# Patient Record
Sex: Male | Born: 1980 | Race: White | Hispanic: No | Smoking: Former smoker
Health system: Southern US, Community
[De-identification: ages and names within clinical notes are randomized; demographics above are authoritative.]

## PROBLEM LIST (undated history)

## (undated) DIAGNOSIS — M6530 Trigger finger, unspecified finger: Secondary | ICD-10-CM

## (undated) DIAGNOSIS — K047 Periapical abscess without sinus: Secondary | ICD-10-CM

## (undated) DIAGNOSIS — A0472 Enterocolitis due to Clostridium difficile, not specified as recurrent: Secondary | ICD-10-CM

## (undated) DIAGNOSIS — F419 Anxiety disorder, unspecified: Secondary | ICD-10-CM

## (undated) DIAGNOSIS — I10 Essential (primary) hypertension: Secondary | ICD-10-CM

## (undated) DIAGNOSIS — F329 Major depressive disorder, single episode, unspecified: Secondary | ICD-10-CM

## (undated) DIAGNOSIS — F32A Depression, unspecified: Secondary | ICD-10-CM

## (undated) HISTORY — PX: BILATERAL CARPAL TUNNEL RELEASE: SHX6508

## (undated) HISTORY — DX: Depression, unspecified: F32.A

## (undated) HISTORY — PX: SHOULDER SURGERY: SHX246

---
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
## 2010-10-03 ENCOUNTER — Emergency Department (HOSPITAL_COMMUNITY)
Admission: EM | Admit: 2010-10-03 | Discharge: 2010-10-03 | Payer: Self-pay | Source: Home / Self Care | Admitting: Emergency Medicine

## 2010-12-16 LAB — BASIC METABOLIC PANEL
BUN: 8 mg/dL (ref 6–23)
CO2: 25 mEq/L (ref 19–32)
Calcium: 9.1 mg/dL (ref 8.4–10.5)
Chloride: 102 mEq/L (ref 96–112)
Creatinine, Ser: 1.18 mg/dL (ref 0.4–1.5)
GFR calc Af Amer: >60 mL/min (ref >60)
GFR calc non Af Amer: >60 mL/min (ref >60)
Glucose, Bld: 88 mg/dL (ref 70–99)
Potassium: 3.4 mEq/L — ABNORMAL LOW (ref 3.5–5.1)
Sodium: 138 mEq/L (ref 135–145)

## 2010-12-16 LAB — HEPATIC FUNCTION PANEL
ALT: 19 U/L (ref 0–53)
AST: 22 U/L (ref 0–37)
Albumin: 3.9 g/dL (ref 3.5–5.2)
Alkaline Phosphatase: 67 U/L (ref 39–117)
Bilirubin, Direct: 0.1 mg/dL (ref 0.0–0.3)
Indirect Bilirubin: 0.4 mg/dL (ref 0.3–0.9)
Total Bilirubin: 0.5 mg/dL (ref 0.3–1.2)
Total Protein: 7.3 g/dL (ref 6.0–8.3)

## 2010-12-16 LAB — DIFFERENTIAL
Basophils Absolute: 0 10*3/uL (ref 0.0–0.1)
Basophils Relative: 0 % (ref 0–1)
Eosinophils Absolute: 0.2 10*3/uL (ref 0.0–0.7)
Eosinophils Relative: 2 % (ref 0–5)
Lymphocytes Relative: 22 % (ref 12–46)
Lymphs Abs: 1.9 10*3/uL (ref 0.7–4.0)
Monocytes Absolute: 1.1 10*3/uL — ABNORMAL HIGH (ref 0.1–1.0)
Monocytes Relative: 14 % — ABNORMAL HIGH (ref 3–12)
Neutro Abs: 5.3 10*3/uL (ref 1.7–7.7)
Neutrophils Relative %: 63 % (ref 43–77)

## 2010-12-16 LAB — CBC
HCT: 45 % (ref 39.0–52.0)
Hemoglobin: 16.1 g/dL (ref 13.0–17.0)
MCH: 31.5 pg (ref 26.0–34.0)
MCHC: 35.8 g/dL (ref 30.0–36.0)
MCV: 88.1 fL (ref 78.0–100.0)
Platelets: 197 10*3/uL (ref 150–400)
RBC: 5.11 MIL/uL (ref 4.22–5.81)
RDW: 12.5 % (ref 11.5–15.5)
WBC: 8.5 10*3/uL (ref 4.0–10.5)

## 2010-12-16 LAB — APTT: aPTT: 30 seconds (ref 24–37)

## 2010-12-16 LAB — PROTIME-INR
INR: 0.99 (ref 0.00–1.49)
Prothrombin Time: 13.3 seconds (ref 11.6–15.2)

## 2010-12-16 LAB — HEMOCCULT GUIAC POC 1CARD (OFFICE): Fecal Occult Bld: NEGATIVE

## 2014-10-06 HISTORY — PX: TRIGGER FINGER RELEASE: SHX641

## 2015-04-25 ENCOUNTER — Encounter (HOSPITAL_BASED_OUTPATIENT_CLINIC_OR_DEPARTMENT_OTHER): Payer: Self-pay | Admitting: *Deleted

## 2015-04-25 ENCOUNTER — Emergency Department (HOSPITAL_BASED_OUTPATIENT_CLINIC_OR_DEPARTMENT_OTHER)
Admission: EM | Admit: 2015-04-25 | Discharge: 2015-04-26 | Disposition: A | Discharge status: Home / Self Care | Payer: Medicaid - Out of State | Attending: Emergency Medicine | Admitting: Emergency Medicine

## 2015-04-25 DIAGNOSIS — R197 Diarrhea, unspecified: Secondary | ICD-10-CM | POA: Insufficient documentation

## 2015-04-25 DIAGNOSIS — R509 Fever, unspecified: Secondary | ICD-10-CM | POA: Insufficient documentation

## 2015-04-25 DIAGNOSIS — Z720 Tobacco use: Secondary | ICD-10-CM | POA: Diagnosis not present

## 2015-04-25 DIAGNOSIS — M2550 Pain in unspecified joint: Secondary | ICD-10-CM | POA: Insufficient documentation

## 2015-04-25 DIAGNOSIS — R1110 Vomiting, unspecified: Secondary | ICD-10-CM | POA: Diagnosis present

## 2015-04-25 DIAGNOSIS — R55 Syncope and collapse: Secondary | ICD-10-CM | POA: Diagnosis not present

## 2015-04-25 DIAGNOSIS — R42 Dizziness and giddiness: Secondary | ICD-10-CM | POA: Diagnosis not present

## 2015-04-25 DIAGNOSIS — I10 Essential (primary) hypertension: Secondary | ICD-10-CM | POA: Insufficient documentation

## 2015-04-25 DIAGNOSIS — R51 Headache: Secondary | ICD-10-CM | POA: Insufficient documentation

## 2015-04-25 HISTORY — DX: Trigger finger, unspecified finger: M65.30

## 2015-04-25 HISTORY — DX: Essential (primary) hypertension: I10

## 2015-04-25 LAB — COMPREHENSIVE METABOLIC PANEL
ALT: 33 U/L (ref 17–63)
AST: 46 U/L — ABNORMAL HIGH (ref 15–41)
Albumin: 4.6 g/dL (ref 3.5–5.0)
Alkaline Phosphatase: 62 U/L (ref 38–126)
Anion gap: 10 (ref 5–15)
BUN: 10 mg/dL (ref 6–20)
CO2: 25 mmol/L (ref 22–32)
Calcium: 9.2 mg/dL (ref 8.9–10.3)
Chloride: 102 mmol/L (ref 101–111)
Creatinine, Ser: 1.14 mg/dL (ref 0.61–1.24)
GFR calc Af Amer: >60 mL/min (ref >60)
GFR calc non Af Amer: >60 mL/min (ref >60)
Glucose, Bld: 113 mg/dL — ABNORMAL HIGH (ref 65–99)
Potassium: 3.6 mmol/L (ref 3.5–5.1)
Sodium: 137 mmol/L (ref 135–145)
Total Bilirubin: 0.6 mg/dL (ref 0.3–1.2)
Total Protein: 7.6 g/dL (ref 6.5–8.1)

## 2015-04-25 LAB — CBC
HCT: 47.3 % (ref 39.0–52.0)
Hemoglobin: 15.8 g/dL (ref 13.0–17.0)
MCH: 31.3 pg (ref 26.0–34.0)
MCHC: 33.4 g/dL (ref 30.0–36.0)
MCV: 93.8 fL (ref 78.0–100.0)
Platelets: 209 10*3/uL (ref 150–400)
RBC: 5.04 MIL/uL (ref 4.22–5.81)
RDW: 12.8 % (ref 11.5–15.5)
WBC: 7.2 10*3/uL (ref 4.0–10.5)

## 2015-04-25 MED ORDER — ACETAMINOPHEN 325 MG PO TABS
650.0000 mg | ORAL_TABLET | Freq: Once | ORAL | Status: AC | PRN
  Administered 2015-04-25: 650 mg via ORAL
  Filled 2015-04-25: qty 2

## 2015-04-25 MED ORDER — SODIUM CHLORIDE 0.9 % IV BOLUS (SEPSIS)
2000.0000 mL | Freq: Once | INTRAVENOUS | Status: AC
  Administered 2015-04-25: 2000 mL via INTRAVENOUS

## 2015-04-25 MED ORDER — ONDANSETRON HCL 4 MG/2ML IJ SOLN
4.0000 mg | Freq: Once | INTRAMUSCULAR | Status: AC
  Administered 2015-04-25: 4 mg via INTRAVENOUS

## 2015-04-25 MED ORDER — ONDANSETRON HCL 4 MG/2ML IJ SOLN
INTRAMUSCULAR | Status: AC
  Filled 2015-04-25: qty 2

## 2015-04-25 MED ORDER — MORPHINE SULFATE 4 MG/ML IJ SOLN
4.0000 mg | Freq: Once | INTRAMUSCULAR | Status: AC
  Administered 2015-04-25: 4 mg via INTRAVENOUS
  Filled 2015-04-25: qty 1

## 2015-04-25 NOTE — ED Notes (Signed)
Pt reported to Joss, RN that he vomited up the Tylenol he received at triage.

## 2015-04-25 NOTE — ED Notes (Signed)
Patient states that he is nauseated to multiple people. The MD aware and the patient verbal order given due to MD in another room. Orders carried out.

## 2015-04-25 NOTE — ED Notes (Signed)
Pt reports he was working outside yesterday, got dizzy and "passed out"- today reports he has vomited over 10 times and only voided once- febrile at home, 101

## 2015-04-25 NOTE — ED Provider Notes (Signed)
CSN: [id number]     Arrival date & time 04/25/15  2031 History  This chart was scribed for Orlie Dakin, MD by Meriel Pica, ED Scribe. This patient was seen in room MH03/MH03 and the patient's care was started 10:47 PM.   Chief Complaint  Patient presents with  . Emesis   Patient is a 34 y.o. male presenting with vomiting. The history is provided by the patient. No language interpreter was used.  Emesis Associated symptoms: arthralgias, diarrhea and headaches   Associated symptoms: no abdominal pain     HPI Comments: Edyn L Evenson is a 34 y.o. male, with PMhx of HTN and PShx of shoulder surgery, who presents to the Emergency Department complaining of sudden onset, constant, moderate dizziness meaning lightheadedness and diffuse arthralgias that began 1 day ago. Pt reports yesterday he was working outside when he lost consciousness for about 1-2 minutes. He was working an Office manager, Biomedical scientist He reports upon gaining his consciousness he drank some water and went back to work. After coming home from work yesterday he reports onset of light-headed dizziness and a frontal headache with associated arthralgias, vomiting, decreased PO intake, several episodes of diarrhea that has resolved, fever (Tmax of 102F). He additionally notes his last urination was at 4pm. The pt has taken ibuprofen at home with no relief. Pt was given a tylenol on arrival to the ED which he states came back up with an episode of emesis. Pt is a current tobacco smoker. Denies abdominal pain, dysuria, use of alcohol, or illegal drugs.   Past Medical History  Diagnosis Date  . Hypertension   . Trigger finger    Past Surgical History  Procedure Laterality Date  . Shoulder surgery    . Bilateral carpal tunnel release     No family history on file. History  Substance Use Topics  . Smoking status: Current Every Day Smoker    Types: Cigarettes  . Smokeless tobacco: Never Used  . Alcohol Use: No     Review of Systems  Constitutional: Positive for fever.  Respiratory: Negative.   Cardiovascular: Negative.   Gastrointestinal: Positive for vomiting and diarrhea. Negative for abdominal pain.  Genitourinary: Negative for dysuria.  Musculoskeletal: Positive for arthralgias.  Skin: Negative.   Allergic/Immunologic: Negative.   Neurological: Positive for dizziness, syncope, light-headedness and headaches.  Psychiatric/Behavioral: Negative.   All other systems reviewed and are negative.   Allergies  Review of patient's allergies indicates no known allergies.  Home Medications   Prior to Admission medications   Not on File   BP 134/76 mmHg  Pulse 92  Temp(Src) 99.6 F (37.6 C) (Oral)  Resp 20  Ht 5\' 8"  (1.727 m)  Wt 160 lb (72.576 kg)  BMI 24.33 kg/m2  SpO2 96% Physical Exam  Constitutional: He is oriented to person, place, and time. He appears well-developed and well-nourished. No distress.  Not ill-appearing  HENT:  Head: Normocephalic and atraumatic.  Eyes: Conjunctivae are normal. Pupils are equal, round, and reactive to light. Right eye exhibits no discharge. Left eye exhibits no discharge. No scleral icterus.  Neck: Neck supple. No JVD present. No tracheal deviation present. No thyromegaly present.  Cardiovascular: Normal rate and regular rhythm.   No murmur heard. Pulmonary/Chest: Effort normal and breath sounds normal. No respiratory distress.  Abdominal: Soft. Bowel sounds are normal. He exhibits no distension. There is no tenderness.  Musculoskeletal: Normal range of motion. He exhibits no edema or tenderness.  Neurological: He is alert and oriented  to person, place, and time. No cranial nerve deficit. Coordination normal.  Skin: Skin is warm and dry. No rash noted. No erythema. No pallor.  Psychiatric: He has a normal mood and affect. His behavior is normal.  Nursing note and vitals reviewed.   ED Course  Procedures  DIAGNOSTIC STUDIES: Oxygen  Saturation is 96% on RA, normal by my interpretation.    COORDINATION OF CARE: 10:55 PM Discussed treatment plan which includes to order diagnostic labs and IV fluids with pt. Pt acknowledges and agrees to plan.   Labs Review Labs Reviewed  COMPREHENSIVE METABOLIC PANEL - Abnormal; Notable for the following:    Glucose, Bld 113 (*)    AST 46 (*)    All other components within normal limits  CBC  URINALYSIS, ROUTINE W REFLEX MICROSCOPIC (NOT AT [facility])    Imaging Review No results found.   EKG Interpretation None     12:25 AM patient feels much improved after treatment with intravenous fluids, opiates and antiemetics. He is able to ambulate without difficulty. Not lightheaded on standing. He drinks without nausea or vomiting. Results for orders placed or performed during the hospital encounter of 04/25/15  Urinalysis, Routine w reflex microscopic (not at [facility])  Result Value Ref Range   Color, Urine YELLOW YELLOW   APPearance CLOUDY (A) CLEAR   Specific Gravity, Urine 1.022 1.005 - 1.030   pH 7.5 5.0 - 8.0   Glucose, UA NEGATIVE NEGATIVE mg/dL   Hgb urine dipstick NEGATIVE NEGATIVE   Bilirubin Urine NEGATIVE NEGATIVE   Ketones, ur 15 (A) NEGATIVE mg/dL   Protein, ur NEGATIVE NEGATIVE mg/dL   Urobilinogen, UA 0.2 0.0 - 1.0 mg/dL   Nitrite NEGATIVE NEGATIVE   Leukocytes, UA NEGATIVE NEGATIVE  Comprehensive metabolic panel  Result Value Ref Range   Sodium 137 135 - 145 mmol/L   Potassium 3.6 3.5 - 5.1 mmol/L   Chloride 102 101 - 111 mmol/L   CO2 25 22 - 32 mmol/L   Glucose, Bld 113 (H) 65 - 99 mg/dL   BUN 10 6 - 20 mg/dL   Creatinine, Ser 1.14 0.61 - 1.24 mg/dL   Calcium 9.2 8.9 - 10.3 mg/dL   Total Protein 7.6 6.5 - 8.1 g/dL   Albumin 4.6 3.5 - 5.0 g/dL   AST 46 (H) 15 - 41 U/L   ALT 33 17 - 63 U/L   Alkaline Phosphatase 62 38 - 126 U/L   Total Bilirubin 0.6 0.3 - 1.2 mg/dL   GFR calc non Af Amer >60 >60 mL/min   GFR calc Af Amer >60 >60 mL/min   Anion gap 10 5 - 15   CBC  Result Value Ref Range   WBC 7.2 4.0 - 10.5 K/uL   RBC 5.04 4.22 - 5.81 MIL/uL   Hemoglobin 15.8 13.0 - 17.0 g/dL   HCT 47.3 39.0 - 52.0 %   MCV 93.8 78.0 - 100.0 fL   MCH 31.3 26.0 - 34.0 pg   MCHC 33.4 30.0 - 36.0 g/dL   RDW 12.8 11.5 - 15.5 %   Platelets 209 150 - 400 K/uL  I-Stat CG4 Lactic Acid, ED  Result Value Ref Range   Lactic Acid, Venous 0.43 (L) 0.5 - 2.0 mmol/L   No results found.  MDM  No signs of meningitis. Symptoms most likely viral in etiology. Patient appears well and not septic appearing.Referral [facility] Plan Tylenol for aches or for fever. Prescription Zofran. Diagnosis febrile illness Final diagnoses:  None          Orlie Dakin, MD 04/26/15 [phone]

## 2015-04-26 LAB — URINALYSIS, ROUTINE W REFLEX MICROSCOPIC
Bilirubin Urine: NEGATIVE
Glucose, UA: NEGATIVE mg/dL
Hgb urine dipstick: NEGATIVE
Ketones, ur: 15 mg/dL — AB
Leukocytes, UA: NEGATIVE
Nitrite: NEGATIVE
Protein, ur: NEGATIVE mg/dL
Specific Gravity, Urine: 1.022 (ref 1.005–1.030)
Urobilinogen, UA: 0.2 mg/dL (ref 0.0–1.0)
pH: 7.5 (ref 5.0–8.0)

## 2015-04-26 LAB — I-STAT CG4 LACTIC ACID, ED: Lactic Acid, Venous: 0.43 mmol/L — ABNORMAL LOW (ref 0.5–2.0)

## 2015-04-26 MED ORDER — ONDANSETRON HCL 8 MG PO TABS: 8.0000 mg | ORAL_TABLET | Freq: Three times a day (TID) | ORAL | Status: DC | PRN

## 2015-04-26 NOTE — Discharge Instructions (Signed)
Fever, Adult Take Tylenol every 4 hours for temperature higher than 100.4 while awake. Make sure that you drink at least six 8 ounce glasses of water or Gatorade each day. We feel that you were dehydrated today. Take the medication prescribed as needed for nausea. Call the [facility] tomorrow to get a primary care physician and to be seen if not feeling better by next week. Return if you continue to vomit after taking the medication prescribed, or if you feel worse for any reason A fever is a temperature of 100.4 F (38 C) or above.  HOME CARE  Take fever medicine as told by your doctor. Do not  take aspirin for fever if you are younger than 32 years of age.  If you are given antibiotic medicine, take it as told. Finish the medicine even if you start to feel better.  Rest.  Drink enough fluids to keep your pee (urine) clear or pale yellow. Do not drink alcohol.  Take a bath or shower with room temperature water. Do not use ice water or alcohol sponge baths.  Wear lightweight, loose clothes. GET HELP RIGHT AWAY IF:   You are short of breath or have trouble breathing.  You are very weak.  You are dizzy or you pass out (faint).  You are very thirsty or are making little or no urine.  You have new pain.  You throw up (vomit) or have watery poop (diarrhea).  You keep throwing up or having watery poop for more than 1 to 2 days.  You have a stiff neck or light bothers your eyes.  You have a skin rash.  You have a fever or problems (symptoms) that last for more than 2 to 3 days.  You have a fever and your problems quickly get worse.  You keep throwing up the fluids you drink.  You do not feel better after 3 days.  You have new problems. MAKE SURE YOU:   Understand these instructions.  Will watch your condition.  Will get help right away if you are not doing well or get worse. Document Released: 07/01/2008 Document Revised: 12/15/2011 Document  Reviewed: 07/24/2011 [facility] Patient Information 2015 [place], [place]. This information is not intended to replace advice given to you by your health care provider. Make sure you discuss any questions you have with your health care provider.

## 2015-07-26 ENCOUNTER — Ambulatory Visit: Payer: Self-pay | Admitting: Physician Assistant

## 2015-09-03 ENCOUNTER — Emergency Department (HOSPITAL_COMMUNITY)
Admission: EM | Admit: 2015-09-03 | Discharge: 2015-09-03 | Disposition: A | Discharge status: Home / Self Care | Payer: Worker's Compensation | Attending: Emergency Medicine | Admitting: Emergency Medicine

## 2015-09-03 ENCOUNTER — Emergency Department (HOSPITAL_COMMUNITY): Payer: Worker's Compensation

## 2015-09-03 ENCOUNTER — Encounter (HOSPITAL_COMMUNITY): Payer: Self-pay | Admitting: Emergency Medicine

## 2015-09-03 DIAGNOSIS — Y990 Civilian activity done for income or pay: Secondary | ICD-10-CM | POA: Insufficient documentation

## 2015-09-03 DIAGNOSIS — M899 Disorder of bone, unspecified: Secondary | ICD-10-CM | POA: Diagnosis not present

## 2015-09-03 DIAGNOSIS — Y9289 Other specified places as the place of occurrence of the external cause: Secondary | ICD-10-CM | POA: Insufficient documentation

## 2015-09-03 DIAGNOSIS — I10 Essential (primary) hypertension: Secondary | ICD-10-CM | POA: Insufficient documentation

## 2015-09-03 DIAGNOSIS — S4991XA Unspecified injury of right shoulder and upper arm, initial encounter: Secondary | ICD-10-CM | POA: Diagnosis present

## 2015-09-03 DIAGNOSIS — Y9389 Activity, other specified: Secondary | ICD-10-CM | POA: Diagnosis not present

## 2015-09-03 DIAGNOSIS — F17210 Nicotine dependence, cigarettes, uncomplicated: Secondary | ICD-10-CM | POA: Diagnosis not present

## 2015-09-03 DIAGNOSIS — S46811A Strain of other muscles, fascia and tendons at shoulder and upper arm level, right arm, initial encounter: Secondary | ICD-10-CM | POA: Insufficient documentation

## 2015-09-03 DIAGNOSIS — S46911A Strain of unspecified muscle, fascia and tendon at shoulder and upper arm level, right arm, initial encounter: Secondary | ICD-10-CM

## 2015-09-03 DIAGNOSIS — X500XXA Overexertion from strenuous movement or load, initial encounter: Secondary | ICD-10-CM | POA: Diagnosis not present

## 2015-09-03 MED ORDER — HYDROCODONE-ACETAMINOPHEN 5-325 MG PO TABS: 2.0000 | ORAL_TABLET | ORAL | Status: DC | PRN

## 2015-09-03 MED ORDER — IBUPROFEN 800 MG PO TABS: 800.0000 mg | ORAL_TABLET | Freq: Three times a day (TID) | ORAL | Status: DC

## 2015-09-03 NOTE — ED Notes (Signed)
Pt states that he works for [employer] and was lifting a heavy tv and felt something pull in his right shoulder.  Here for workman's comp injury.

## 2015-09-03 NOTE — ED Provider Notes (Signed)
CSN: [id number]     Arrival date & time 09/03/15  1236 History  By signing my name below, I, Logan Benton, attest that this documentation has been prepared under the direction and in the presence of Helen Hashimoto, [place].  Electronically Signed: [name], ED Scribe. 09/03/2015. 2:05 PM.   Chief Complaint  Patient presents with  . Shoulder Pain   The history is provided by the patient. No language interpreter was used.    HPI Comments: Logan Benton is a 34 y.o. male with a history of right shoulder surgery who presents to the Emergency Department complaining of acute onset, moderate, sharp right shoulder pain that radiates to his right upper arm and started PTA while lifting a package. He has not tried any treatment PTA. Pt works for [employer] and is required to do heavy lifting. He denies CP, difficulty breathing, rib pain and numbness in his fingers.  Past Medical History  Diagnosis Date  . Hypertension   . Trigger finger    Past Surgical History  Procedure Laterality Date  . Shoulder surgery    . Bilateral carpal tunnel release     History reviewed. No pertinent family history. Social History  Substance Use Topics  . Smoking status: Current Every Day Smoker    Types: Cigarettes  . Smokeless tobacco: Never Used  . Alcohol Use: No   Review of Systems  Respiratory: Negative for shortness of breath.   Cardiovascular: Negative for chest pain.  Musculoskeletal: Positive for arthralgias.  Skin: Negative for wound.  Neurological: Negative for numbness.  All other systems reviewed and are negative.  Allergies  Review of patient's allergies indicates no known allergies.  Home Medications   Prior to Admission medications   Medication Sig Start Date End Date Taking? Authorizing Provider  ondansetron (ZOFRAN) 8 MG tablet Take 1 tablet (8 mg total) by mouth every 8 (eight) hours as needed for nausea. 04/26/15   Orlie Dakin, MD   BP 159/95 mmHg  Pulse 96   Temp(Src) 97.3 F (36.3 C) (Oral)  Resp 20  Ht 5\' 8"  (1.727 m)  Wt 170 lb (77.111 kg)  BMI 25.85 kg/m2  SpO2 99% Physical Exam  Constitutional: He appears well-developed and well-nourished. No distress.  HENT:  Head: Normocephalic and atraumatic.  Eyes: Conjunctivae and EOM are normal.  Neck: Neck supple. No tracheal deviation present.  Cardiovascular: Normal rate.   Pulmonary/Chest: Effort normal. No respiratory distress.  Musculoskeletal: He exhibits tenderness.  Right shoulder: Tender to insertion of the bicep tendon Slightly tender to anterior rotator cuff tendons Normal ROM Neurovascularly intact Sensation intact  Skin: Skin is warm and dry.  Psychiatric: He has a normal mood and affect. His behavior is normal.  Nursing note and vitals reviewed.   ED Course  Procedures  DIAGNOSTIC STUDIES: Oxygen Saturation is 99% on RA, normal by my interpretation.    COORDINATION OF CARE: 2:06 PM Discussed x-ray results and treatment plan with pt which includes referral to orthopedist, repeat x-ray of humerus for lesions in a few months and RICE. Pt agreed to plan.  Labs Review Labs Reviewed - No data to display  Imaging Review Dg Shoulder Right  09/03/2015  CLINICAL DATA:  Acute right shoulder pain after lifting large television. EXAM: RIGHT SHOULDER - 2+ VIEW COMPARISON:  None. FINDINGS: There is no evidence of fracture or dislocation. There is no evidence of arthropathy. Two lucencies are seen in the proximal humeral shaft. Soft tissues are unremarkable. IMPRESSION: No fracture or dislocation  is noted in the right shoulder. No significant degenerative changes noted. To lucencies are noted in the proximal right humeral shaft which may represent bone lesions; dedicated radiographs of the right humerus are recommended for further evaluation. Electronically Signed   By: Marijo Conception, M.D.   On: 09/03/2015 13:32   I have personally reviewed and evaluated these images as part of my  medical decision-making.   EKG Interpretation None      MDM  Pt has 2 bony lesions probably cystic. Pt is advised of finding and to discuss with orthopedist. Advised to follow-up with orthopedist for probable tendon tear. Given referral to Dr. Rolena Infante. Prescription for Hydrocodone, Ibuprofen and a sling.   Final diagnoses:  Shoulder strain, right, initial encounter    Pt advised to follow up with Orthopaedist  Dr. Rolena Infante Hydrocodone Ibuprofen     Hollace Kinnier [place], PA-C 09/03/15 [place], MD 09/04/15 [phone]

## 2015-09-03 NOTE — Discharge Instructions (Signed)
Muscle Strain A muscle strain is an injury that occurs when a muscle is stretched beyond its normal length. Usually a small number of muscle fibers are torn when this happens. Muscle strain is rated in degrees. First-degree strains have the least amount of muscle fiber tearing and pain. Second-degree and third-degree strains have increasingly more tearing and pain.  Usually, recovery from muscle strain takes 1-2 weeks. Complete healing takes 5-6 weeks.  CAUSES  Muscle strain happens when a sudden, violent force placed on a muscle stretches it too far. This may occur with lifting, sports, or a fall.  RISK FACTORS Muscle strain is especially common in athletes.  SIGNS AND SYMPTOMS At the site of the muscle strain, there may be:  Pain.  Bruising.  Swelling.  Difficulty using the muscle due to pain or lack of normal function. DIAGNOSIS  Your health care provider will perform a physical exam and ask about your medical history. TREATMENT  Often, the best treatment for a muscle strain is resting, icing, and applying cold compresses to the injured area.  HOME CARE INSTRUCTIONS   Use the PRICE method of treatment to promote muscle healing during the first 2-3 days after your injury. The PRICE method involves:  Protecting the muscle from being injured again.  Restricting your activity and resting the injured body part.  Icing your injury. To do this, put ice in a plastic bag. Place a towel between your skin and the bag. Then, apply the ice and leave it on from 15-20 minutes each hour. After the third day, switch to moist heat packs.  Apply compression to the injured area with a splint or elastic bandage. Be careful not to wrap it too tightly. This may interfere with blood circulation or increase swelling.  Elevate the injured body part above the level of your heart as often as you can.  Only take over-the-counter or prescription medicines for pain, discomfort, or fever as directed by your  health care provider.  Warming up prior to exercise helps to prevent future muscle strains. SEEK MEDICAL CARE IF:   You have increasing pain or swelling in the injured area.  You have numbness, tingling, or a significant loss of strength in the injured area. MAKE SURE YOU:   Understand these instructions.  Will watch your condition.  Will get help right away if you are not doing well or get worse.   This information is not intended to replace advice given to you by your health care provider. Make sure you discuss any questions you have with your health care provider.   Document Released: 09/22/2005 Document Revised: 07/13/2013 Document Reviewed: 04/21/2013 Elsevier Interactive Patient Education 2016 [place] Cuff Injury Rotator cuff injury is any type of injury to the set of muscles and tendons that make up the stabilizing unit of your shoulder. This unit holds the ball of your upper arm bone (humerus) in the socket of your shoulder blade (scapula).  CAUSES Injuries to your rotator cuff most commonly come from sports or activities that cause your arm to be moved repeatedly over your head. Examples of this include throwing, weight lifting, swimming, or racquet sports. Long lasting (chronic) irritation of your rotator cuff can cause soreness and swelling (inflammation), bursitis, and eventual damage to your tendons, such as a tear (rupture). SIGNS AND SYMPTOMS Acute rotator cuff tear:  Sudden tearing sensation followed by severe pain shooting from your upper shoulder down your arm toward your elbow.  Decreased range of motion of your  shoulder because of pain and muscle spasm.  Severe pain.  Inability to raise your arm out to the side because of pain and loss of muscle power (large tears). Chronic rotator cuff tear:  Pain that usually is worse at night and may interfere with sleep.  Gradual weakness and decreased shoulder motion as the pain worsens.  Decreased range  of motion. Rotator cuff tendinitis:  Deep ache in your shoulder and the outside upper arm over your shoulder.  Pain that comes on gradually and becomes worse when lifting your arm to the side or turning it inward. DIAGNOSIS Rotator cuff injury is diagnosed through a medical history, physical exam, and imaging exam. The medical history helps determine the type of rotator cuff injury. Your health care provider will look at your injured shoulder, feel the injured area, and ask you to move your shoulder in different positions. X-ray exams typically are done to rule out other causes of shoulder pain, such as fractures. MRI is the exam of choice for the most severe shoulder injuries because the images show muscles and tendons.  TREATMENT  Chronic tear:  Medicine for pain, such as acetaminophen or ibuprofen.  Physical therapy and range-of-motion exercises may be helpful in maintaining shoulder function and strength.  Steroid injections into your shoulder joint.  Surgical repair of the rotator cuff if the injury does not heal with noninvasive treatment. Acute tear:  Anti-inflammatory medicines such as ibuprofen and naproxen to help reduce pain and swelling.  A sling to help support your arm and rest your rotator cuff muscles. Long-term use of a sling is not advised. It may cause significant stiffening of the shoulder joint.  Surgery may be considered within a few weeks, especially in younger, active people, to return the shoulder to full function.  Indications for surgical treatment include the following:  Age younger than 18 years.  Rotator cuff tears that are complete.  Physical therapy, rest, and anti-inflammatory medicines have been used for 6-8 weeks, with no improvement.  Employment or sporting activity that requires constant shoulder use. Tendinitis:  Anti-inflammatory medicines such as ibuprofen and naproxen to help reduce pain and swelling.  A sling to help support your arm  and rest your rotator cuff muscles. Long-term use of a sling is not advised. It may cause significant stiffening of the shoulder joint.  Severe tendinitis may require:  Steroid injections into your shoulder joint.  Physical therapy.  Surgery. HOME CARE INSTRUCTIONS   Apply ice to your injury:  Put ice in a plastic bag.  Place a towel between your skin and the bag.  Leave the ice on for 20 minutes, 2-3 times a day.  If you have a shoulder immobilizer (sling and straps), wear it until told otherwise by your health care provider.  You may want to sleep on several pillows or in a recliner at night to lessen swelling and pain.  Only take over-the-counter or prescription medicines for pain, discomfort, or fever as directed by your health care provider.  Do simple hand squeezing exercises with a soft rubber ball to decrease hand swelling. SEEK MEDICAL CARE IF:   Your shoulder pain increases, or new pain or numbness develops in your arm, hand, or fingers.  Your hand or fingers are colder than your other hand. SEEK IMMEDIATE MEDICAL CARE IF:   Your arm, hand, or fingers are numb or tingling.  Your arm, hand, or fingers are increasingly swollen and painful, or they turn white or blue. MAKE SURE YOU:  Understand these instructions.  Will watch your condition.  Will get help right away if you are not doing well or get worse.   This information is not intended to replace advice given to you by your health care provider. Make sure you discuss any questions you have with your health care provider.   Document Released: 09/19/2000 Document Revised: 09/27/2013 Document Reviewed: 05/04/2013 Elsevier Interactive Patient Education [employer].

## 2019-05-31 ENCOUNTER — Emergency Department (HOSPITAL_COMMUNITY)
Admission: EM | Admit: 2019-05-31 | Discharge: 2019-05-31 | Disposition: A | Discharge status: Home / Self Care | Payer: Medicaid - Out of State | Attending: Emergency Medicine | Admitting: Emergency Medicine

## 2019-05-31 ENCOUNTER — Other Ambulatory Visit: Payer: Self-pay

## 2019-05-31 ENCOUNTER — Emergency Department (HOSPITAL_COMMUNITY): Payer: Medicaid - Out of State

## 2019-05-31 ENCOUNTER — Encounter (HOSPITAL_COMMUNITY): Payer: Self-pay

## 2019-05-31 ENCOUNTER — Ambulatory Visit
Admission: EM | Admit: 2019-05-31 | Discharge: 2019-05-31 | Disposition: A | Discharge status: Home / Self Care | Payer: Medicaid - Out of State | Attending: Emergency Medicine | Admitting: Emergency Medicine

## 2019-05-31 DIAGNOSIS — R42 Dizziness and giddiness: Secondary | ICD-10-CM

## 2019-05-31 DIAGNOSIS — R9431 Abnormal electrocardiogram [ECG] [EKG]: Secondary | ICD-10-CM

## 2019-05-31 DIAGNOSIS — R0789 Other chest pain: Secondary | ICD-10-CM

## 2019-05-31 DIAGNOSIS — Z79899 Other long term (current) drug therapy: Secondary | ICD-10-CM | POA: Insufficient documentation

## 2019-05-31 DIAGNOSIS — R000 Tachycardia, unspecified: Secondary | ICD-10-CM

## 2019-05-31 DIAGNOSIS — R079 Chest pain, unspecified: Secondary | ICD-10-CM

## 2019-05-31 DIAGNOSIS — Z87891 Personal history of nicotine dependence: Secondary | ICD-10-CM

## 2019-05-31 DIAGNOSIS — F17210 Nicotine dependence, cigarettes, uncomplicated: Secondary | ICD-10-CM | POA: Insufficient documentation

## 2019-05-31 DIAGNOSIS — I10 Essential (primary) hypertension: Secondary | ICD-10-CM | POA: Insufficient documentation

## 2019-05-31 LAB — TROPONIN I (HIGH SENSITIVITY)
Troponin I (High Sensitivity): <2 ng/L (ref <18)
Troponin I (High Sensitivity): <2 ng/L (ref <18)

## 2019-05-31 LAB — CBC
HCT: 44.6 % (ref 39.0–52.0)
Hemoglobin: 14.7 g/dL (ref 13.0–17.0)
MCH: 30.5 pg (ref 26.0–34.0)
MCHC: 33 g/dL (ref 30.0–36.0)
MCV: 92.5 fL (ref 80.0–100.0)
Platelets: 264 10*3/uL (ref 150–400)
RBC: 4.82 MIL/uL (ref 4.22–5.81)
RDW: 13 % (ref 11.5–15.5)
WBC: 9.8 10*3/uL (ref 4.0–10.5)
nRBC: 0 % (ref 0.0–0.2)

## 2019-05-31 LAB — RAPID URINE DRUG SCREEN, HOSP PERFORMED
Amphetamines: NOT DETECTED
Barbiturates: NOT DETECTED
Benzodiazepines: NOT DETECTED
Cocaine: NOT DETECTED
Opiates: NOT DETECTED
Tetrahydrocannabinol: POSITIVE — AB

## 2019-05-31 LAB — BASIC METABOLIC PANEL
Anion gap: 11 (ref 5–15)
BUN: 9 mg/dL (ref 6–20)
CO2: 22 mmol/L (ref 22–32)
Calcium: 9.2 mg/dL (ref 8.9–10.3)
Chloride: 103 mmol/L (ref 98–111)
Creatinine, Ser: 0.85 mg/dL (ref 0.61–1.24)
GFR calc Af Amer: >60 mL/min (ref >60)
GFR calc non Af Amer: >60 mL/min (ref >60)
Glucose, Bld: 98 mg/dL (ref 70–99)
Potassium: 3.3 mmol/L — ABNORMAL LOW (ref 3.5–5.1)
Sodium: 136 mmol/L (ref 135–145)

## 2019-05-31 LAB — D-DIMER, QUANTITATIVE: D-Dimer, Quant: 0.27 ug/mL-FEU (ref 0.00–0.50)

## 2019-05-31 MED ORDER — ONDANSETRON HCL 4 MG/2ML IJ SOLN
4.0000 mg | Freq: Once | INTRAMUSCULAR | Status: AC
  Administered 2019-05-31: 4 mg via INTRAVENOUS
  Filled 2019-05-31: qty 2

## 2019-05-31 MED ORDER — LORAZEPAM 2 MG/ML IJ SOLN
0.5000 mg | Freq: Once | INTRAMUSCULAR | Status: AC
  Administered 2019-05-31: 0.5 mg via INTRAVENOUS
  Filled 2019-05-31: qty 1

## 2019-05-31 MED ORDER — POTASSIUM CHLORIDE CRYS ER 20 MEQ PO TBCR
40.0000 meq | EXTENDED_RELEASE_TABLET | Freq: Once | ORAL | Status: AC
  Administered 2019-05-31: 40 meq via ORAL
  Filled 2019-05-31: qty 2

## 2019-05-31 MED ORDER — SODIUM CHLORIDE 0.9 % IV BOLUS
1000.0000 mL | Freq: Once | INTRAVENOUS | Status: AC
  Administered 2019-05-31: 1000 mL via INTRAVENOUS

## 2019-05-31 MED ORDER — MORPHINE SULFATE (PF) 4 MG/ML IV SOLN
4.0000 mg | Freq: Once | INTRAVENOUS | Status: AC
  Administered 2019-05-31: 4 mg via INTRAVENOUS
  Filled 2019-05-31: qty 1

## 2019-05-31 MED ORDER — METHOCARBAMOL 500 MG PO TABS: 500.0000 mg | ORAL_TABLET | Freq: Two times a day (BID) | ORAL | 0 refills | Status: DC

## 2019-05-31 MED ORDER — SODIUM CHLORIDE 0.9% FLUSH: 3.0000 mL | Freq: Once | INTRAVENOUS | Status: DC

## 2019-05-31 NOTE — ED Notes (Signed)
Patient is being discharged from the [facility] and sent to the Emergency Department via ems  by staff. Per B Wurst , patient is stable but in need of higher level of care due to CP. Patient is aware and verbalizes understanding of plan of care.  Vitals:   05/31/19 1305  BP: 138/90  Resp: 18  Temp: 98.4 F (36.9 C)  SpO2: 98%

## 2019-05-31 NOTE — ED Notes (Signed)
Pt placed on 02 at 2 L

## 2019-05-31 NOTE — ED Triage Notes (Signed)
Pt states he had chest pain and tightness yesterday, states he becomes sweaty and tachy cardic

## 2019-05-31 NOTE — ED Notes (Signed)
Report called to Everardo Beals RN

## 2019-05-31 NOTE — ED Provider Notes (Addendum)
[facility]   [id number] 05/31/19 Arrival Time: 1250   CC: CHEST tightness  SUBJECTIVE:  Logan Benton is a 38 y.o. male hx HTN, who presents with complaint of abrupt chest tightness/ pain that began yesterday.  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  Localizes chest pain to the substernal region and left side of chest.  Describes as improving, that is constant and tightness/ heaviness in character.  Rates pain as 5/10.   Has tried aspirin with pain relief.  Symptoms made worse with activity. Worse with daily living activities.  Denies radiating symptoms.  Reports previous symptoms in the past, improved without intervention.  Complains of chills, lightheadedness, sweating, dizziness, tachycardia (110-180 bpm at home yesterday, 80-90 bpm in office), nausea, 1 episode of vomiting, tingling BUEs, and anxiety. Denies fever, palpitations, SOB, abdominal pain, changes in bowel or bladder habits, numbness in extremities, peripheral edema.    Denies SOB, calf pain or swelling, recent long travel, recent surgery, malignancy, hormone use, or previous blood clot  Previous hx of smoking 1 year ago, smoked 1 PPD x 10 years, does admit to vaping twice daily x 1 year  Does admit to intermittent cannabis use.  Denies other drug use  Denies close relatives with cardiac hx; maternal and paternal grandfathers with stroke at age of 33  ROS: As per HPI.  All other pertinent ROS negative.    Past Medical History:  Diagnosis Date  . Hypertension   . Trigger finger    Past Surgical History:  Procedure Laterality Date  . BILATERAL CARPAL TUNNEL RELEASE    . SHOULDER SURGERY     No Known Allergies No current facility-administered medications on file prior to encounter.    Current Outpatient Medications on File Prior to Encounter  Medication Sig Dispense Refill  . ibuprofen (ADVIL,MOTRIN) 800 MG tablet Take 1 tablet (800 mg total) by  mouth 3 (three) times daily. 21 tablet 0   Social History   Socioeconomic History  . Marital status: Legally Separated    Spouse name: Not on file  . Number of children: Not on file  . Years of education: Not on file  . Highest education level: Not on file  Occupational History  . Not on file  Social Needs  . Financial resource strain: Not on file  . Food insecurity    Worry: Not on file    Inability: Not on file  . Transportation needs    Medical: Not on file    Non-medical: Not on file  Tobacco Use  . Smoking status: Current Every Day Smoker    Types: Cigarettes  . Smokeless tobacco: Never Used  Substance and Sexual Activity  . Alcohol use: No  . Drug use: No  . Sexual activity: Not on file  Lifestyle  . Physical activity    Days per week: Not on file    Minutes per session: Not on file  . Stress: Not on file  Relationships  . Social Herbalist on phone: Not on file    Gets together: Not on file    Attends religious service: Not on file    Active member of club or organization: Not on file    Attends meetings of clubs or organizations: Not on file    Relationship status: Not on file  . Intimate partner violence    Fear of current or ex partner: Not on file    Emotionally  abused: Not on file    Physically abused: Not on file    Forced sexual activity: Not on file  Other Topics Concern  . Not on file  Social History Narrative  . Not on file   Family History  Family history unknown: Yes     OBJECTIVE:  Vitals:   05/31/19 1305  BP: 138/90  Resp: 18  Temp: 98.4 F (36.9 C)  SpO2: 98%    General appearance: alert; appears uncomfortable Eyes: PERRLA; EOMI; conjunctiva normal HENT: normocephalic; atraumatic; oropharynx clear Neck: supple Lungs: clear to auscultation bilaterally without adventitious breath sounds Heart: regular rate and rhythm.  Clear S1 and S2 without rubs, gallops, or murmur. Chest Wall: no heaves lifts or thrills; NTTP  with AP or lateral compression of chest Extremities: no cyanosis or edema; symmetrical with no gross deformities Skin: clammy and pale Psychological: alert and cooperative; anxious mood and affect  ECG: Orders placed or performed during the hospital encounter of 05/31/19  . ED EKG  . ED EKG   EKG normal sinus rhythm with possible incomplete RBB.   No past EKG for comparison.    ASSESSMENT & PLAN:  1. Feeling of chest tightness   2. Chest pain, unspecified type   3. Nonspecific abnormal electrocardiogram (ECG) (EKG)     No orders of the defined types were placed in this encounter.   EKG concerning for RBBB.  No past EKG for comparison.   Patient with active symptoms. Recommending further evaluation and management in the ED.  Patient aware and in agreement with this plan.   EMS called to transport patient to ED    Lestine Box, PA-C 05/31/19 [place], [place], [place] 05/31/19 [phone]

## 2019-05-31 NOTE — ED Provider Notes (Signed)
[facility] EMERGENCY DEPARTMENT Provider Note   CSN: [id number] Arrival date & time: 05/31/19  1419     History   Chief Complaint Chief Complaint  Patient presents with   Chest Pain    HPI Logan Benton is a 38 y.o. male with PMH/o HTN who presents for evaluation who presents for evaluation of chest pain that began yesterday.  He states that he was sitting on the couch yesterday when all of a sudden he started developing chest pain.  He describes it as a "tightness" over his left chest.  He states that when he initially got the pain, he broke out into a sweat and felt like he was having trouble breathing.  He states he did have a few episodes of vomiting yesterday.  He states that when the chest pain first started, he took his vitals and noted that his heart rate was between 120s 150s.  He states that he took some aspirin yesterday which improved the pain a little but states that it is been constant since yesterday at onset.  He states he also felt "just not right."  He states that he has had some tingling sensation in his fingertips and is felt lightheaded, almost like he is going to pass out.  He has not had any syncopal episodes.  He went to urgent care today for evaluation of symptoms where he was evaluated and prompted to go to the emergency department for further evaluation.  He currently states that his chest pain is a 3/10.  He was given nitro x1 by EMS which brought his pain down from a 4-5/10.  He states that he noted his pain was worse with activity.  He states it is slightly worse with deep inspiration.  He denies any new trauma, injury.  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  He has not had any recent sicknesses and denies any fevers, cough.  He has had blood high blood pressure in the past but is not being treated for currently.  Denies any history of diabetes.  Denies any personal cardiac history.  Denies any family history of MI before  the age of 9.  He states that he vapes but does not smoke cigarettes.  Denies any cocaine, heroin use.  Does report occasional marijuana use.  HPI: A 38 year old patient presents for evaluation of chest pain. Initial onset of pain was more than 6 hours ago. The patient's chest pain is described as heaviness/pressure/tightness, is worse with exertion and is relieved by nitroglycerin. The patient reports some diaphoresis. The patient's chest pain is middle- or left-sided, is not well-localized, is not sharp and does not radiate to the arms/jaw/neck. The patient does not complain of nausea. The patient has no history of stroke, has no history of peripheral artery disease, has not smoked in the past 90 days, denies any history of treated diabetes, has no relevant family history of coronary artery disease (first degree relative at less than age 36), is not hypertensive, has no history of hypercholesterolemia and does not have an elevated BMI (>=30).   The history is provided by the patient.    Past Medical History:  Diagnosis Date   Hypertension    Trigger finger     There are no active problems to display for this patient.   Past Surgical History:  Procedure Laterality Date   BILATERAL CARPAL TUNNEL RELEASE     SHOULDER SURGERY  Home Medications    Prior to Admission medications   Medication Sig Start Date End Date Taking? Authorizing Provider  aspirin 325 MG EC tablet Take 325 mg by mouth once.   Yes [provider]  Multiple Vitamin (MULTIVITAMIN WITH MINERALS) TABS tablet Take 1 tablet by mouth daily.   Yes [provider]  nitroGLYCERIN (NITROSTAT) 0.4 MG SL tablet Place 0.4 mg under the tongue every 5 (five) minutes as needed for chest pain (Adminstered by EMS).   Yes [provider]  methocarbamol (ROBAXIN) 500 MG tablet Take 1 tablet (500 mg total) by mouth 2 (two) times daily. 05/31/19   Volanda Napoleon, PA-C    Family History Family  History  Family history unknown: Yes    Social History Social History   Tobacco Use   Smoking status: Current Every Day Smoker    Types: Cigarettes   Smokeless tobacco: Never Used  Substance Use Topics   Alcohol use: No   Drug use: No     Allergies   Patient has no known allergies.   Review of Systems Review of Systems  Constitutional: Positive for diaphoresis. Negative for fever.  Respiratory: Positive for shortness of breath. Negative for cough.   Cardiovascular: Positive for chest pain.  Gastrointestinal: Negative for abdominal pain, nausea and vomiting.  Genitourinary: Negative for dysuria and hematuria.  Neurological: Negative for headaches.  All other systems reviewed and are negative.    Physical Exam Updated Vital Signs BP 124/73    Pulse 63    Temp 98.2 F (36.8 C)    Resp 11    Ht 5\' 11"  (1.803 m)    Wt 68 kg    SpO2 100%    BMI 20.92 kg/m   Physical Exam Vitals signs and nursing note reviewed.  Constitutional:      Appearance: Normal appearance. He is well-developed.     Comments: Very anxious  HENT:     Head: Normocephalic and atraumatic.  Eyes:     General: Lids are normal.     Conjunctiva/sclera: Conjunctivae normal.     Pupils: Pupils are equal, round, and reactive to light.  Neck:     Musculoskeletal: Full passive range of motion without pain.  Cardiovascular:     Rate and Rhythm: Normal rate and regular rhythm.     Pulses: Normal pulses.          Radial pulses are 2+ on the right side and 2+ on the left side.       Dorsalis pedis pulses are 2+ on the right side and 2+ on the left side.     Heart sounds: Normal heart sounds. No murmur. No friction rub. No gallop.   Pulmonary:     Effort: Pulmonary effort is normal.     Breath sounds: Normal breath sounds.     Comments: Increased work of breathing noted. No evidence of respiratory distress. Able to speak in full sentences without any difficulty.  Abdominal:     Palpations: Abdomen is  soft. Abdomen is not rigid.     Tenderness: There is no abdominal tenderness. There is no guarding.     Comments: Abdomen is soft, non-distended, non-tender. No rigidity, No guarding. No peritoneal signs.  Musculoskeletal: Normal range of motion.  Skin:    General: Skin is warm and dry.     Capillary Refill: Capillary refill takes less than 2 seconds.  Neurological:     Mental Status: He is alert and oriented to person, place,  and time.  Psychiatric:        Mood and Affect: Mood is anxious.        Speech: Speech normal.      ED Treatments / Results  Labs (all labs ordered are listed, but only abnormal results are displayed) Labs Reviewed  BASIC METABOLIC PANEL - Abnormal; Notable for the following components:      Result Value   Potassium 3.3 (*)    All other components within normal limits  RAPID URINE DRUG SCREEN, HOSP PERFORMED - Abnormal; Notable for the following components:   Tetrahydrocannabinol POSITIVE (*)    All other components within normal limits  CBC  D-DIMER, QUANTITATIVE (NOT AT [facility])  TROPONIN I (HIGH SENSITIVITY)  TROPONIN I (HIGH SENSITIVITY)    EKG EKG Interpretation  Date/Time:  Tuesday May 31 2019 14:26:25 EDT Ventricular Rate:  70 PR Interval:    QRS Duration: 123 QT Interval:  413 QTC Calculation: 446 R Axis:   64 Text Interpretation:  Sinus rhythm Right bundle branch block Confirmed by Nat Christen [phone]) on 05/31/2019 4:03:06 PM   Radiology Dg Chest 2 View  Result Date: 05/31/2019 CLINICAL DATA:  Chest pain. EXAM: CHEST - 2 VIEW COMPARISON:  Radiographs of July 05, 2017. FINDINGS: The heart size and mediastinal contours are within normal limits. Both lungs are clear. No pneumothorax or pleural effusion is noted. The visualized skeletal structures are unremarkable. IMPRESSION: No active cardiopulmonary disease. Electronically Signed   By: Marijo Conception M.D.   On: 05/31/2019 15:29    Procedures Procedures (including critical care  time)  Medications Ordered in ED Medications  sodium chloride flush (NS) 0.9 % injection 3 mL (3 mLs Intravenous Not Given 05/31/19 1434)  sodium chloride 0.9 % bolus 1,000 mL (0 mLs Intravenous Stopped 05/31/19 1624)  ondansetron (ZOFRAN) injection 4 mg (4 mg Intravenous Given 05/31/19 1521)  morphine 4 MG/ML injection 4 mg (4 mg Intravenous Given 05/31/19 1521)  potassium chloride SA (K-DUR) CR tablet 40 mEq (40 mEq Oral Given 05/31/19 1637)  LORazepam (ATIVAN) injection 0.5 mg (0.5 mg Intravenous Given 05/31/19 1739)     Initial Impression / Assessment and Plan / ED Course  I have reviewed the triage vital signs and the nursing notes.  Pertinent labs & imaging results that were available during my care of the patient were reviewed by me and considered in my medical decision making (see chart for details).     HEAR Score: 11  38 year old male with past medical of hypertension though controlled and not on any medications at the time presents for evaluation of chest pain that began yesterday.  She did with some shortness of breath, diaphoresis.  Does feel like his pain was worse with deep inspiration.  On initial ED arrival, he is afebrile.  Vitals are stable.  He is very very anxious and has increased work of breathing noted.  No evidence of respiratory distress.  No wheezing noted on exam.  Question of his increased work of breathing is due to anxiety as he does appear very nervous and anxious.  He was initially seen at urgent care and sent to the emergency department for further evaluation.  Consider ACS etiology versus infectious etiology versus musculoskeletal pain versus anxiety.  Doubt PE but given history of pleuritic chest pain, will plan for d-dimer.  Additionally, history/physical exam not concerning for dissection, unstable angina.  BMP shows potassium of 3.3.  Repletion given.  UDS is positive for marijuana.  Initial troponin is negative.  D-dimer is negative.  CBC is unremarkable.   Given that he is not hypoxic or tachycardic here in the ED, with a negative d-dimer, I feel that this is reassuring that his symptoms are not due to PE.  Given patient's history, risk factors, he has a heart score of 3.  We will plan a delta troponin.  Discussed results with patient.  He does report improvement after medications.  He feels like the pain has improved though he still feels a tightness on his chest.  Vitals are stable.  Delta troponin negative.  Discussed results with patient.  Given reassuring work-up, 2- troponins, feel that his symptoms are not due to ACS etiology.  He states he still has a little bit of chest tightness.  He has no wheezing noted on exam.  No evidence of respiratory stress.  Vital signs are stable. At this time, patient exhibits no emergent life-threatening condition that require further evaluation in ED or admission. Discussed patient with Dr. Lacinda Axon who is agreeble.  We will send him home with short course of muscle relaxers.  Instructed him to follow-up with a primary care doctor and patient given referrals to outpatient clinics. Patient had ample opportunity for questions and discussion. All patient's questions were answered with full understanding. Strict return precautions discussed. Patient expresses understanding and agreement to plan.   Portions of this note were generated with [employer]. Dictation errors may occur despite best attempts at proofreading.   Final Clinical Impressions(s) / ED Diagnoses   Final diagnoses:  Nonspecific chest pain    ED Discharge Orders         Ordered    methocarbamol (ROBAXIN) 500 MG tablet  2 times daily     05/31/19 1745           Desma Mcgregor 05/31/19 1801    Nat Christen, MD 06/01/19 [phone]

## 2019-05-31 NOTE — Discharge Instructions (Addendum)
EKG concerning for RBBB.  No past EKG for comparison.   Patient with active symptoms. Recommending further evaluation and management in the ED.  Patient aware and in agreement with this plan.   EMS called to transport patient to ED

## 2019-05-31 NOTE — ED Triage Notes (Signed)
Pt reports CP that began yesterday while sitting on sofa. Pt woke up with same pain and decided to go to urgent care. Pt was sent by EMS pain decreased after nitro. Pt reports tightness. Reports tingling in hands and fingers.Pt took adult asa today and yesterday

## 2019-05-31 NOTE — Discharge Instructions (Signed)
You can take Tylenol or Ibuprofen as directed for pain. You can alternate Tylenol and Ibuprofen every 4 hours. If you take Tylenol at 1pm, then you can take Ibuprofen at 5pm. Then you can take Tylenol again at 9pm.   Take Robaxin as prescribed. This medication will make you drowsy so do not drive or drink alcohol when taking it.  Follow-up with [facility] to establish a primary care doctor if you do not have one.   Return to the Emergency Department immediately if you experiencing worsening chest pain, difficulty breathing, nausea/vomiting, get very sweaty, headache or any other worsening or concerning symptoms.

## 2019-08-14 ENCOUNTER — Other Ambulatory Visit: Payer: Self-pay

## 2019-08-14 ENCOUNTER — Emergency Department (HOSPITAL_COMMUNITY): Payer: Self-pay

## 2019-08-14 ENCOUNTER — Encounter (HOSPITAL_COMMUNITY): Payer: Self-pay

## 2019-08-14 ENCOUNTER — Emergency Department (HOSPITAL_COMMUNITY)
Admission: EM | Admit: 2019-08-14 | Discharge: 2019-08-14 | Disposition: A | Discharge status: Home / Self Care | Payer: Self-pay | Attending: Emergency Medicine | Admitting: Emergency Medicine

## 2019-08-14 DIAGNOSIS — F17210 Nicotine dependence, cigarettes, uncomplicated: Secondary | ICD-10-CM | POA: Insufficient documentation

## 2019-08-14 DIAGNOSIS — R072 Precordial pain: Secondary | ICD-10-CM | POA: Insufficient documentation

## 2019-08-14 DIAGNOSIS — I10 Essential (primary) hypertension: Secondary | ICD-10-CM | POA: Insufficient documentation

## 2019-08-14 DIAGNOSIS — M5432 Sciatica, left side: Secondary | ICD-10-CM | POA: Insufficient documentation

## 2019-08-14 LAB — BASIC METABOLIC PANEL
Anion gap: 9 (ref 5–15)
BUN: 10 mg/dL (ref 6–20)
CO2: 26 mmol/L (ref 22–32)
Calcium: 9.1 mg/dL (ref 8.9–10.3)
Chloride: 104 mmol/L (ref 98–111)
Creatinine, Ser: 0.79 mg/dL (ref 0.61–1.24)
GFR calc Af Amer: >60 mL/min (ref >60)
GFR calc non Af Amer: >60 mL/min (ref >60)
Glucose, Bld: 95 mg/dL (ref 70–99)
Potassium: 4.3 mmol/L (ref 3.5–5.1)
Sodium: 139 mmol/L (ref 135–145)

## 2019-08-14 LAB — CBC WITH DIFFERENTIAL/PLATELET
Abs Immature Granulocytes: 0.01 10*3/uL (ref 0.00–0.07)
Basophils Absolute: 0 10*3/uL (ref 0.0–0.1)
Basophils Relative: 0 %
Eosinophils Absolute: 0.1 10*3/uL (ref 0.0–0.5)
Eosinophils Relative: 1 %
HCT: 45.9 % (ref 39.0–52.0)
Hemoglobin: 14.6 g/dL (ref 13.0–17.0)
Immature Granulocytes: 0 %
Lymphocytes Relative: 30 %
Lymphs Abs: 2.1 10*3/uL (ref 0.7–4.0)
MCH: 31.3 pg (ref 26.0–34.0)
MCHC: 31.8 g/dL (ref 30.0–36.0)
MCV: 98.3 fL (ref 80.0–100.0)
Monocytes Absolute: 0.6 10*3/uL (ref 0.1–1.0)
Monocytes Relative: 9 %
Neutro Abs: 4.1 10*3/uL (ref 1.7–7.7)
Neutrophils Relative %: 60 %
Platelets: 294 10*3/uL (ref 150–400)
RBC: 4.67 MIL/uL (ref 4.22–5.81)
RDW: 13 % (ref 11.5–15.5)
WBC: 7 10*3/uL (ref 4.0–10.5)
nRBC: 0 % (ref 0.0–0.2)

## 2019-08-14 LAB — TROPONIN I (HIGH SENSITIVITY): Troponin I (High Sensitivity): <2 ng/L (ref <18)

## 2019-08-14 MED ORDER — KETOROLAC TROMETHAMINE 30 MG/ML IJ SOLN
30.0000 mg | Freq: Once | INTRAMUSCULAR | Status: AC
  Administered 2019-08-14: 30 mg via INTRAVENOUS
  Filled 2019-08-14: qty 1

## 2019-08-14 MED ORDER — DEXAMETHASONE SODIUM PHOSPHATE 10 MG/ML IJ SOLN
10.0000 mg | Freq: Once | INTRAMUSCULAR | Status: AC
  Administered 2019-08-14: 10 mg via INTRAVENOUS
  Filled 2019-08-14: qty 1

## 2019-08-14 MED ORDER — IBUPROFEN 800 MG PO TABS: 800.0000 mg | ORAL_TABLET | Freq: Three times a day (TID) | ORAL | 0 refills | Status: DC | PRN

## 2019-08-14 MED ORDER — METHOCARBAMOL 500 MG PO TABS: 500.0000 mg | ORAL_TABLET | Freq: Three times a day (TID) | ORAL | 0 refills | Status: DC | PRN

## 2019-08-14 MED ORDER — DICLOFENAC SODIUM 1 % TD GEL: 2.0000 g | Freq: Four times a day (QID) | TRANSDERMAL | 0 refills | Status: DC | PRN

## 2019-08-14 NOTE — ED Provider Notes (Signed)
Emergency Department Provider Note   I have reviewed the triage vital signs and the nursing notes.   HISTORY  Chief Complaint Chest Pain   HPI Logan Benton is a 38 y.o. male with PMH of HTN presents to the emergency department for evaluation of central chest tightness with intermittent left leg pain.  Symptoms have been ongoing for the past 2 days.  Patient states that his left leg discomfort seems to be starting at the top of his left buttock and radiates down the back of his left leg.  He has no associated numbness or weakness in the leg.  No modifying factors for the pain.  He states is intermittent but severe.  He has not noticed any left leg swelling or discoloration.  No fevers or chills.  No groin numbness.  No bowel or bladder incontinence.  No urinary retention symptoms.  He has had an associated constant, central chest pressure also starting 2 days ago.  He attributes this to anxiety and denies any shortness of breath.  No sharp, stabbing pain no radiation of symptoms or other modifying factors. He notes similar chest pain in the past.     Past Medical History:  Diagnosis Date  . Hypertension   . Trigger finger     There are no active problems to display for this patient.   Past Surgical History:  Procedure Laterality Date  . BILATERAL CARPAL TUNNEL RELEASE    . SHOULDER SURGERY      Allergies Patient has no known allergies.  Family History  Family history unknown: Yes    Social History Social History   Tobacco Use  . Smoking status: Current Every Day Smoker    Types: Cigarettes  . Smokeless tobacco: Never Used  Substance Use Topics  . Alcohol use: No  . Drug use: No    Review of Systems  Constitutional: No fever/chills Eyes: No visual changes. ENT: No sore throat. Cardiovascular: Positive chest pain. Respiratory: Denies shortness of breath. Gastrointestinal: No abdominal pain.  No nausea, no vomiting.  No diarrhea.  No constipation.  Genitourinary: Negative for dysuria. Musculoskeletal: Positive left buttock pain radiating down the left leg.  Skin: Negative for rash. Neurological: Negative for headaches, focal weakness or numbness.  10-point ROS otherwise negative.  ____________________________________________   PHYSICAL EXAM:  VITAL SIGNS: ED Triage Vitals  Enc Vitals Group     BP 08/14/19 0946 (!) 155/81     Pulse Rate 08/14/19 0946 86     Resp 08/14/19 0946 18     Temp 08/14/19 0946 98.5 F (36.9 C)     Temp Source 08/14/19 0946 Oral     SpO2 08/14/19 0946 100 %     Weight 08/14/19 0945 150 lb (68 kg)     Height 08/14/19 0945 5\' 11"  (1.803 m)   Constitutional: Alert and oriented. Well appearing and in no acute distress. Eyes: Conjunctivae are normal.  Head: Atraumatic. Nose: No congestion/rhinnorhea. Mouth/Throat: Mucous membranes are moist.   Neck: No stridor.   Cardiovascular: Normal rate, regular rhythm. Good peripheral circulation. Grossly normal heart sounds.   Respiratory: Normal respiratory effort.  No retractions. Lungs CTAB. Gastrointestinal: Soft and nontender. No distention.  Musculoskeletal: No lower extremity tenderness nor edema. No gross deformities of extremities. Normal ROM of the left hip and knee.  Neurologic:  Normal speech and language. No gross focal neurologic deficits are appreciated. Normal strength and sensation in the bilateral LEs.  Skin:  Skin is warm, dry and intact. No  rash noted.  ____________________________________________   LABS (all labs ordered are listed, but only abnormal results are displayed)  Labs Reviewed  BASIC METABOLIC PANEL  CBC WITH DIFFERENTIAL/PLATELET  TROPONIN I (HIGH SENSITIVITY)   ____________________________________________  EKG   EKG Interpretation  Date/Time:  Sunday August 14 2019 09:53:32 EST Ventricular Rate:  80 PR Interval:    QRS Duration: 120 QT Interval:  375 QTC Calculation: 433 R Axis:   78 Text Interpretation:  Sinus rhythm IVCD, consider atypical RBBB No STEMI Confirmed by Nanda Quinton [phone]) on 08/14/2019 11:19:11 AM       ____________________________________________  RADIOLOGY  Dg Chest Portable 1 View  Result Date: 08/14/2019 CLINICAL DATA:  Chest pain EXAM: PORTABLE CHEST 1 VIEW COMPARISON:  CTA chest dated 06/04/2019 FINDINGS: Lungs are clear.  No pleural effusion or pneumothorax. The heart is normal in size. IMPRESSION: No evidence of acute cardiopulmonary disease. Electronically Signed   By: Julian Hy M.D.   On: 08/14/2019 11:10    ____________________________________________   PROCEDURES  Procedure(s) performed:   Procedures  None ____________________________________________   INITIAL IMPRESSION / ASSESSMENT AND PLAN / ED COURSE  Pertinent labs & imaging results that were available during my care of the patient were reviewed by me and considered in my medical decision making (see chart for details).   Patient presents to the emergency department with intermittent left leg pain which seems most consistent with sciatica.  He has no red flag signs or symptoms to suspect spinal cord compression/emergency.  He has a normal exam.  His chest tightness has been constant for 2 days.  EKG is reassuring and independently interpreted as above.  Plan for chest x-ray and single troponin.  Will give steroid and Toradol for likely sciatica and reassess.  No indication for emergency MRI or other advanced imaging of the spine at this time.   CXR and labs including troponin are negative. Plan for Decadron here and Motrin/Flexeril at home. Discussed signs of spine emergency and return precautions. Provided contact info for PCP and Cardiology. Patient feeling improved and comfortable with the plan at discharge.  ____________________________________________  FINAL CLINICAL IMPRESSION(S) / ED DIAGNOSES  Final diagnoses:  Precordial chest pain  Sciatica of left side     MEDICATIONS  GIVEN DURING THIS VISIT:  Medications  ketorolac (TORADOL) 30 MG/ML injection 30 mg (30 mg Intravenous Given 08/14/19 1037)  dexamethasone (DECADRON) injection 10 mg (10 mg Intravenous Given 08/14/19 1039)     NEW OUTPATIENT MEDICATIONS STARTED DURING THIS VISIT:  Discharge Medication List as of 08/14/2019 11:39 AM    START taking these medications   Details  diclofenac sodium (VOLTAREN) 1 % GEL Apply 2 g topically 4 (four) times daily as needed (pain)., Starting Sun 08/14/2019, Normal    ibuprofen (ADVIL) 800 MG tablet Take 1 tablet (800 mg total) by mouth every 8 (eight) hours as needed., Starting Sun 08/14/2019, Normal        Note:  This document was prepared using Dragon voice recognition software and may include unintentional dictation errors.  Nanda Quinton, MD, [facility] Emergency [facility]    Robbert Langlinais, [facility], MD 08/14/19 [phone]

## 2019-08-14 NOTE — ED Triage Notes (Signed)
Pt reports chest pain and sharp pain in backs of both legs x 2 days.  Denies injury.  Denies any n/v, cough, fever, or sob.  Recently finished antibiotics for a dental abscess.

## 2019-08-14 NOTE — Discharge Instructions (Signed)
You are seen in the emergency department today with left leg pain.  I suspect this is inflammation of the sciatic nerve.  Please use ibuprofen and Voltaren gel as needed for pain.  For severe pain you can take the Robaxin but this will cause drowsiness and you cannot drive a car if you are taking this.  Return to the emergency department if you develop fever, worsening back pain, numbness in your legs, numbness in your groin, inability to urinate, or urinating/having bowel movements on yourself.   Please establish care with a primary care physician.  I have listed a practice on the discharge paperwork.  If your chest pain worsens you should consider following with a cardiologist.  I have listed the name of 1 on your paperwork.  If your chest pain worsens significantly or suddenly you should return to the emergency department.

## 2019-09-19 ENCOUNTER — Other Ambulatory Visit: Payer: Self-pay

## 2019-09-19 ENCOUNTER — Ambulatory Visit
Admission: EM | Admit: 2019-09-19 | Discharge: 2019-09-19 | Disposition: A | Discharge status: Home / Self Care | Payer: Self-pay | Attending: Emergency Medicine | Admitting: Emergency Medicine

## 2019-09-19 DIAGNOSIS — K649 Unspecified hemorrhoids: Secondary | ICD-10-CM

## 2019-09-19 DIAGNOSIS — R197 Diarrhea, unspecified: Secondary | ICD-10-CM

## 2019-09-19 DIAGNOSIS — R140 Abdominal distension (gaseous): Secondary | ICD-10-CM

## 2019-09-19 MED ORDER — LIDOCAINE-HYDROCORTISONE ACE 1-1 % EX CREA: 1.0000 cm | TOPICAL_CREAM | Freq: Two times a day (BID) | CUTANEOUS | 0 refills | Status: DC

## 2019-09-19 MED ORDER — VANCOMYCIN HCL 125 MG PO CAPS: 125.0000 mg | ORAL_CAPSULE | Freq: Four times a day (QID) | ORAL | 0 refills | Status: AC

## 2019-09-19 NOTE — ED Triage Notes (Signed)
Pt presents with yeast infection in anus, pt states he has been on several rounds of antibiotics and developed itching and discharge from rectum

## 2019-09-19 NOTE — Discharge Instructions (Addendum)
Advised patient to take antibiotic as prescribed Advised patient to use yogurt daily Advised patient to increase fluid intake Return to clinic if symptoms get worse

## 2019-09-19 NOTE — ED Provider Notes (Addendum)
As noted some point got slowed down with [facility]    CSN: [id number] Arrival date & time: 09/19/19  1219      History   Chief Complaint No chief complaint on file.   HPI Logan Benton is a 38 y.o. male.   Logan Benton 38 year old male presents to the urgent care with a complaint of abdominal bloating, diarrhea, loose stool and possible hemorrhoid for the past 7 days.  Patient stated he completed a course of multiple antibiotic in the last 6 weeks for cental abscess.  He has been using OTC medication to manage his symptom without relief.  Denies nausea vomiting, chills and fever.  The history is provided by the patient. No language interpreter was used.    Past Medical History:  Diagnosis Date  . Hypertension   . Trigger finger     There are no problems to display for this patient.   Past Surgical History:  Procedure Laterality Date  . BILATERAL CARPAL TUNNEL RELEASE    . SHOULDER SURGERY         Home Medications    Prior to Admission medications   Medication Sig Start Date End Date Taking? Authorizing Provider  aspirin 325 MG EC tablet Take 325 mg by mouth once.    [provider]  diclofenac sodium (VOLTAREN) 1 % GEL Apply 2 g topically 4 (four) times daily as needed (pain). 08/14/19   Long, Wonda Olds, MD  ibuprofen (ADVIL) 800 MG tablet Take 1 tablet (800 mg total) by mouth every 8 (eight) hours as needed. 08/14/19   Long, Wonda Olds, MD  Lidocaine-Hydrocortisone Ace 1-1 % CREA Apply 1 cm topically 2 (two) times daily. 09/19/19   Ishita Mcnerney, Darrelyn Hillock, FNP  methocarbamol (ROBAXIN) 500 MG tablet Take 1 tablet (500 mg total) by mouth every 8 (eight) hours as needed for muscle spasms. 08/14/19   Long, Wonda Olds, MD  Multiple Vitamin (MULTIVITAMIN WITH MINERALS) TABS tablet Take 1 tablet by mouth daily.    [provider]  nitroGLYCERIN (NITROSTAT) 0.4 MG SL tablet Place 0.4 mg under the tongue every 5 (five) minutes as needed for chest pain  (Adminstered by EMS).    [provider]  vancomycin (VANCOCIN) 125 MG capsule Take 1 capsule (125 mg total) by mouth 4 (four) times daily for 10 days. 09/19/19 09/29/19  Emerson Monte, FNP    Family History Family History  Problem Relation Age of Onset  . Rheum arthritis Mother     Social History Social History   Tobacco Use  . Smoking status: Current Every Day Smoker    Types: Cigarettes  . Smokeless tobacco: Never Used  Substance Use Topics  . Alcohol use: No  . Drug use: No     Allergies   Patient has no known allergies.   Review of Systems Review of Systems  Constitutional: Negative.   HENT: Negative.   Respiratory: Negative.   Cardiovascular: Negative.   Gastrointestinal: Positive for abdominal pain and diarrhea.       Rectal itching and burning  ROS:All other are negative   Physical Exam Triage Vital Signs ED Triage Vitals  Enc Vitals Group     BP 09/19/19 1238 (!) 160/79     Pulse Rate 09/19/19 1238 82     Resp 09/19/19 1238 18     Temp 09/19/19 1238 98.1 F (36.7 C)     Temp src --      SpO2 09/19/19 1238 98 %  Weight --      Height --      Head Circumference --      Peak Flow --      Pain Score 09/19/19 1236 0     Pain Loc --      Pain Edu? --      Excl. in [place]? --    No data found.  Updated Vital Signs BP (!) 160/79   Pulse 82   Temp 98.1 F (36.7 C)   Resp 18   SpO2 98%   Visual Acuity Right Eye Distance:   Left Eye Distance:   Bilateral Distance:    Right Eye Near:   Left Eye Near:    Bilateral Near:     Physical Exam Vitals and nursing note reviewed.  Constitutional:      General: He is not in acute distress.    Appearance: Normal appearance. He is normal weight. He is not ill-appearing or toxic-appearing.  Cardiovascular:     Rate and Rhythm: Normal rate and regular rhythm.     Pulses: Normal pulses.     Heart sounds: Normal heart sounds. No murmur.  Pulmonary:     Effort: Pulmonary effort is normal.  No respiratory distress.     Breath sounds: No wheezing or rhonchi.  Chest:     Chest wall: No tenderness.  Abdominal:     General: Abdomen is flat. Bowel sounds are normal. There is no distension.     Palpations: There is no mass.     Tenderness: There is no abdominal tenderness. There is no guarding or rebound.     Hernia: No hernia is present.  Skin:    Capillary Refill: Capillary refill takes less than 2 seconds.  Neurological:     Mental Status: He is alert and oriented to person, place, and time.      UC Treatments / Results  Labs (all labs ordered are listed, but only abnormal results are displayed) Labs Reviewed - No data to display  EKG   Radiology No results found.  Procedures Procedures (including critical care time)  Medications Ordered in UC Medications - No data to display  Initial Impression / Assessment and Plan / UC Course  I have reviewed the triage vital signs and the nursing notes.  Pertinent labs & imaging results that were available during my care of the patient were reviewed by me and considered in my medical decision making (see chart for details).   Patient stable for discharge.  Will treat patient for possible C. Difficile and hemorrhoids.  Advised patient to follow-up with primary care or to return if symptoms get worse.  Final Clinical Impressions(s) / UC Diagnoses   Final diagnoses:  Diarrhea, unspecified type  Abdominal bloating  Hemorrhoids, unspecified hemorrhoid type     Discharge Instructions     Advised patient to take antibiotic as prescribed Advised patient to use yogurt daily Advised patient to increase fluid intake Return to clinic if symptoms get worse    ED Prescriptions    Medication Sig Dispense Auth. Provider   vancomycin (VANCOCIN) 125 MG capsule Take 1 capsule (125 mg total) by mouth 4 (four) times daily for 10 days. 40 capsule B[place] Sandoval S, FNP   Lidocaine-Hydrocortisone Ace 1-1 % CREA Apply 1 cm  topically 2 (two) times daily. 30 g Emerson Monte, FNP     PDMP not reviewed this encounter.   Emerson Monte, FNP 09/19/19 1348    Emerson Monte, FNP 09/19/19  2304  

## 2019-10-02 ENCOUNTER — Other Ambulatory Visit: Payer: Self-pay

## 2019-10-02 ENCOUNTER — Emergency Department (HOSPITAL_COMMUNITY)
Admission: EM | Admit: 2019-10-02 | Discharge: 2019-10-02 | Disposition: A | Discharge status: Home / Self Care | Payer: Self-pay | Attending: Emergency Medicine | Admitting: Emergency Medicine

## 2019-10-02 ENCOUNTER — Emergency Department (HOSPITAL_COMMUNITY): Payer: Self-pay

## 2019-10-02 ENCOUNTER — Encounter (HOSPITAL_COMMUNITY): Payer: Self-pay | Admitting: Emergency Medicine

## 2019-10-02 DIAGNOSIS — Z79899 Other long term (current) drug therapy: Secondary | ICD-10-CM | POA: Insufficient documentation

## 2019-10-02 DIAGNOSIS — K0889 Other specified disorders of teeth and supporting structures: Secondary | ICD-10-CM | POA: Insufficient documentation

## 2019-10-02 DIAGNOSIS — Z7982 Long term (current) use of aspirin: Secondary | ICD-10-CM | POA: Insufficient documentation

## 2019-10-02 DIAGNOSIS — M791 Myalgia, unspecified site: Secondary | ICD-10-CM

## 2019-10-02 DIAGNOSIS — F17210 Nicotine dependence, cigarettes, uncomplicated: Secondary | ICD-10-CM | POA: Insufficient documentation

## 2019-10-02 DIAGNOSIS — I10 Essential (primary) hypertension: Secondary | ICD-10-CM | POA: Insufficient documentation

## 2019-10-02 DIAGNOSIS — M7918 Myalgia, other site: Secondary | ICD-10-CM | POA: Insufficient documentation

## 2019-10-02 DIAGNOSIS — R079 Chest pain, unspecified: Secondary | ICD-10-CM

## 2019-10-02 HISTORY — DX: Periapical abscess without sinus: K04.7

## 2019-10-02 HISTORY — DX: Enterocolitis due to Clostridium difficile, not specified as recurrent: A04.72

## 2019-10-02 LAB — BASIC METABOLIC PANEL
Anion gap: 10 (ref 5–15)
BUN: 11 mg/dL (ref 6–20)
CO2: 25 mmol/L (ref 22–32)
Calcium: 9.7 mg/dL (ref 8.9–10.3)
Chloride: 105 mmol/L (ref 98–111)
Creatinine, Ser: 0.87 mg/dL (ref 0.61–1.24)
GFR calc Af Amer: >60 mL/min (ref >60)
GFR calc non Af Amer: >60 mL/min (ref >60)
Glucose, Bld: 103 mg/dL — ABNORMAL HIGH (ref 70–99)
Potassium: 4.2 mmol/L (ref 3.5–5.1)
Sodium: 140 mmol/L (ref 135–145)

## 2019-10-02 LAB — TROPONIN I (HIGH SENSITIVITY): Troponin I (High Sensitivity): <2 ng/L (ref <18)

## 2019-10-02 LAB — CBC
HCT: 48.5 % (ref 39.0–52.0)
Hemoglobin: 16.2 g/dL (ref 13.0–17.0)
MCH: 31.3 pg (ref 26.0–34.0)
MCHC: 33.4 g/dL (ref 30.0–36.0)
MCV: 93.8 fL (ref 80.0–100.0)
Platelets: 269 10*3/uL (ref 150–400)
RBC: 5.17 MIL/uL (ref 4.22–5.81)
RDW: 12.6 % (ref 11.5–15.5)
WBC: 7.2 10*3/uL (ref 4.0–10.5)
nRBC: 0 % (ref 0.0–0.2)

## 2019-10-02 MED ORDER — SODIUM CHLORIDE 0.9 % IV BOLUS
1000.0000 mL | Freq: Once | INTRAVENOUS | Status: AC
  Administered 2019-10-02: 11:00:00 1000 mL via INTRAVENOUS

## 2019-10-02 MED ORDER — ONDANSETRON HCL 4 MG/2ML IJ SOLN
4.0000 mg | Freq: Once | INTRAMUSCULAR | Status: AC
  Administered 2019-10-02: 4 mg via INTRAVENOUS
  Filled 2019-10-02: qty 2

## 2019-10-02 MED ORDER — CYCLOBENZAPRINE HCL 10 MG PO TABS: 10.0000 mg | ORAL_TABLET | Freq: Three times a day (TID) | ORAL | 0 refills | Status: DC | PRN

## 2019-10-02 MED ORDER — BUPIVACAINE-EPINEPHRINE (PF) 0.5% -1:200000 IJ SOLN: 1.8000 mL | Freq: Once | INTRAMUSCULAR | Status: DC

## 2019-10-02 MED ORDER — MORPHINE SULFATE (PF) 4 MG/ML IV SOLN
4.0000 mg | Freq: Once | INTRAVENOUS | Status: AC
  Administered 2019-10-02: 4 mg via INTRAVENOUS
  Filled 2019-10-02: qty 1

## 2019-10-02 NOTE — Discharge Instructions (Signed)
Please follow up with your dentist tomorrow for further management of your dental infection.  Your labs are normal today.  Your chest xray is normal as well as your EKG.

## 2019-10-02 NOTE — ED Triage Notes (Addendum)
Patient c/o chest pain with dizziness x1 week. Denies nausea, vomiting, cough, or fever. Denies cardiac history. Patient does report body aches and back pain. Patient recently finished course of vancomycin for c-diff x2 days ago. Per patient burning sensation in abd. Patient reports decrease in loose stools but still states foul odor. Patient also had left, upper abscess tooth pulled Tuesday and declined antibiotics when offered. Patient also has right, upper abscess tooth that he is supposed to get pulled tomorrow.

## 2019-10-02 NOTE — ED Provider Notes (Signed)
[facility] EMERGENCY DEPARTMENT Provider Note   CSN: [id number] Arrival date & time: 10/02/19  [id number]     History Chief Complaint  Patient presents with  . Chest Pain    Logan Benton is a 38 y.o. male.  The history is provided by the patient. No language interpreter was used.  Chest Pain      38 year old male with history of hypertension, presenting complaining of body aches and feeling dehydrated.  Patient report he developed dental infection several weeks prior, and was placed on multiple dose of antibiotics as well as having one of his tooth extracted.  Due to recurrent antibiotic use he subsequently developed C. difficile diarrhea and recently finished 10-day course of vancomycin.  Last day was 2 days ago.  He is here due to generalized body aches, pain throughout his chest abdomen back legs, still having some loose stools but no longer diarrhea.  Patient states he feels like he is wiped out.  Patient however denies runny nose sneezing coughing sore throat loss of taste or smell or dysuria.  He does complain of pain to his right upper tooth and request to have a dental block.  He does have an appointment with his dentist tomorrow.  He denies any recent sick contact with anyone with COVID-19.  Past Medical History:  Diagnosis Date  . Abscessed tooth   . C. difficile diarrhea   . Hypertension   . Trigger finger     There are no problems to display for this patient.   Past Surgical History:  Procedure Laterality Date  . BILATERAL CARPAL TUNNEL RELEASE    . SHOULDER SURGERY         Family History  Problem Relation Age of Onset  . Rheum arthritis Mother     Social History   Tobacco Use  . Smoking status: Current Every Day Smoker    Types: Cigarettes  . Smokeless tobacco: Never Used  Substance Use Topics  . Alcohol use: No  . Drug use: No    Home Medications Prior to Admission medications   Medication Sig Start Date End Date Taking? Authorizing Provider    aspirin 325 MG EC tablet Take 325 mg by mouth once.    [provider]  diclofenac sodium (VOLTAREN) 1 % GEL Apply 2 g topically 4 (four) times daily as needed (pain). 08/14/19   Long, Wonda Olds, MD  ibuprofen (ADVIL) 800 MG tablet Take 1 tablet (800 mg total) by mouth every 8 (eight) hours as needed. 08/14/19   Long, Wonda Olds, MD  Lidocaine-Hydrocortisone Ace 1-1 % CREA Apply 1 cm topically 2 (two) times daily. 09/19/19   Avegno, Darrelyn Hillock, FNP  methocarbamol (ROBAXIN) 500 MG tablet Take 1 tablet (500 mg total) by mouth every 8 (eight) hours as needed for muscle spasms. 08/14/19   Long, Wonda Olds, MD  Multiple Vitamin (MULTIVITAMIN WITH MINERALS) TABS tablet Take 1 tablet by mouth daily.    [provider]  nitroGLYCERIN (NITROSTAT) 0.4 MG SL tablet Place 0.4 mg under the tongue every 5 (five) minutes as needed for chest pain (Adminstered by EMS).    [provider]    Allergies    Patient has no known allergies.  Review of Systems   Review of Systems  Cardiovascular: Positive for chest pain.  All other systems reviewed and are negative.   Physical Exam Updated Vital Signs BP (S) (!) 151/103 (BP Location: Left Arm)   Pulse 92   Temp 98.2 F (36.8  C) (Oral)   Resp 12   Ht 5\' 11"  (1.803 m)   Wt 67.1 kg   SpO2 100%   BMI 20.64 kg/m   Physical Exam Vitals and nursing note reviewed.  Constitutional:      General: He is not in acute distress.    Appearance: He is well-developed.  HENT:     Head: Atraumatic.     Mouth/Throat:     Comments: Widespread dental decay with multiple missing teeth.  Tenderness to tooth #3 on palpation.  No obvious abscess appreciated. Eyes:     Conjunctiva/sclera: Conjunctivae normal.  Cardiovascular:     Rate and Rhythm: Normal rate and regular rhythm.  Pulmonary:     Breath sounds: Normal breath sounds.  Chest:     Chest wall: Tenderness (Mild diffuse abdominal tenderness without focal point tenderness) present.   Abdominal:     Palpations: Abdomen is soft.  Musculoskeletal:     Cervical back: Neck supple.  Skin:    Findings: No rash.  Neurological:     Mental Status: He is alert and oriented to person, place, and time.  Psychiatric:        Mood and Affect: Mood normal.     ED Results / Procedures / Treatments   Labs (all labs ordered are listed, but only abnormal results are displayed) Labs Reviewed  BASIC METABOLIC PANEL - Abnormal; Notable for the following components:      Result Value   Glucose, Bld 103 (*)    All other components within normal limits  CBC  TROPONIN I (HIGH SENSITIVITY)    EKG EKG Interpretation  Date/Time:  Sunday October 02 2019 09:43:43 EST Ventricular Rate:  89 PR Interval:    QRS Duration: 133 QT Interval:  388 QTC Calculation: 473 R Axis:   70 Text Interpretation: Sinus rhythm Borderline short PR interval Right bundle branch block ST elev, probable normal early repol pattern since last tracing no significant change Confirmed by Noemi Chapel [phone]) on 10/02/2019 10:18:04 AM   Radiology DG Chest Port 1 View  Result Date: 10/02/2019 CLINICAL DATA:  Chest pain and body aches EXAM: PORTABLE CHEST 1 VIEW COMPARISON:  September 05, 2019 FINDINGS: The heart size and mediastinal contours are within normal limits. Both lungs are clear. The visualized skeletal structures are unremarkable. IMPRESSION: No active disease. Electronically Signed   By: Abelardo Diesel M.D.   On: 10/02/2019 10:39    Procedures Procedures (including critical care time)  Medications Ordered in ED Medications  bupivacaine-epinephrine (MARCAINE W/ EPI) 0.5% -1:200000 injection 1.8 mL (has no administration in time range)  sodium chloride 0.9 % bolus 1,000 mL (1,000 mLs Intravenous New Bag/Given 10/02/19 1100)  ondansetron (ZOFRAN) injection 4 mg (4 mg Intravenous Given 10/02/19 1059)  morphine 4 MG/ML injection 4 mg (4 mg Intravenous Given 10/02/19 1100)    ED Course  I have  reviewed the triage vital signs and the nursing notes.  Pertinent labs & imaging results that were available during my care of the patient were reviewed by me and considered in my medical decision making (see chart for details).    MDM Rules/Calculators/A&P                      BP (S) (!) 151/103 (BP Location: Left Arm)   Pulse 92   Temp 98.2 F (36.8 C) (Oral)   Resp 12   Ht 5\' 11"  (1.803 m)   Wt 67.1 kg   SpO2 100%  BMI 20.64 kg/m   Final Clinical Impression(s) / ED Diagnoses Final diagnoses:  Myalgia  Pain, dental    Rx / DC Orders ED Discharge Orders    None     10:23 AM Patient recently treated for C. difficile secondary to antibiotic use for dental infection.  He is here with generalized weakness, feeling dehydrated and having myalgias likely secondary to recent C. difficile infection.  He also requests for a dental block for his right upper premolar in which he will have it extract tomorrow.  Patient otherwise well-appearing.  Will give IV fluid, antinausea medication and a dose of pain medication.  11:16 AM EKG, chest x-ray, labs are all reassuring.  Patient is mildly hypertensive with a blood pressure of 151/103 likely secondary to underlying pain causing his discomfort.  At this time, encourage patient to follow-up with dentist tomorrow for dental extraction as previously scheduled.  Patient otherwise stable for discharge.  Care discussed with Dr. Sabra Heck.   Domenic Moras, PA-C 10/02/19 1202    Noemi Chapel, MD 10/03/19 [phone]

## 2019-10-06 ENCOUNTER — Telehealth: Payer: Self-pay

## 2019-10-06 NOTE — Telephone Encounter (Signed)
pt. eligibility is 10/06/2019 till 10/05/2020 with [employer]. Faxed over pt. [employer] application and supporting docs on 10/06/2019.  Drema Halon

## 2019-10-13 ENCOUNTER — Ambulatory Visit
Admission: EM | Admit: 2019-10-13 | Discharge: 2019-10-13 | Disposition: A | Discharge status: Home / Self Care | Payer: Self-pay

## 2019-10-13 ENCOUNTER — Other Ambulatory Visit: Payer: Self-pay

## 2019-10-13 DIAGNOSIS — F411 Generalized anxiety disorder: Secondary | ICD-10-CM

## 2019-10-13 HISTORY — DX: Anxiety disorder, unspecified: F41.9

## 2019-10-13 MED ORDER — HYDROXYZINE HCL 25 MG PO TABS: 25.0000 mg | ORAL_TABLET | Freq: Four times a day (QID) | ORAL | 0 refills | Status: DC

## 2019-10-13 NOTE — ED Provider Notes (Signed)
[facility]    CSN: [id number] Arrival date & time: 10/13/19  1111      History   Chief Complaint Chief Complaint  Patient presents with   Anxiety    HPI Oluwaferanmi L Lavery is a 39 y.o. male.   Wilbern Legner 39 years old male presented to the urgent care with a complaint of anxiety. Reports he went to the ED on 10/02/2019.  He was prescribed hydroxyzine and the medication ran out.  Denies a precipitating event, or recent stresor.  Currently taking hydroxyzine with relief.  Symptoms are made worse with stress.  Reports similar symptoms in the past that improved with antidepressant medication.  Denies hallucination or suicidal thoughts.  Patient denies fever, chills, anhedonia, difficulty sleeping, changes in normal activities, nausea, vomiting, chest pain, SOB, abdominal pain, changes in bowel or bladder habits.    The history is provided by the patient. No language interpreter was used.  Anxiety    Past Medical History:  Diagnosis Date   Abscessed tooth    Anxiety    C. difficile diarrhea    Hypertension    Trigger finger     There are no problems to display for this patient.   Past Surgical History:  Procedure Laterality Date   BILATERAL CARPAL TUNNEL RELEASE     SHOULDER SURGERY         Home Medications    Prior to Admission medications   Medication Sig Start Date End Date Taking? Authorizing Provider  citalopram (CELEXA) 20 MG tablet Take 20 mg by mouth daily. 08/19/19   [provider]  cyclobenzaprine (FLEXERIL) 10 MG tablet Take 1 tablet (10 mg total) by mouth 3 (three) times daily as needed. 10/02/19   Domenic Moras, PA-C  diclofenac sodium (VOLTAREN) 1 % GEL Apply 2 g topically 4 (four) times daily as needed (pain). Patient not taking: Reported on 10/02/2019 08/14/19   Long, Wonda Olds, MD  HYDROcodone-acetaminophen (NORCO) 7.5-325 MG tablet Take 1 tablet by mouth every 4 (four) hours as needed. 09/27/19   [provider]    hydrOXYzine (ATARAX/VISTARIL) 25 MG tablet Take 1 tablet (25 mg total) by mouth every 6 (six) hours. 10/13/19   Meziah Blasingame, Darrelyn Hillock, FNP  ibuprofen (ADVIL) 800 MG tablet Take 1 tablet (800 mg total) by mouth every 8 (eight) hours as needed. Patient not taking: Reported on 10/02/2019 08/14/19   Long, Wonda Olds, MD  Lidocaine-Hydrocortisone Ace 1-1 % CREA Apply 1 cm topically 2 (two) times daily. Patient not taking: Reported on 10/02/2019 09/19/19   Emerson Monte, FNP  methocarbamol (ROBAXIN) 500 MG tablet Take 1 tablet (500 mg total) by mouth every 8 (eight) hours as needed for muscle spasms. Patient not taking: Reported on 10/02/2019 08/14/19   Long, Wonda Olds, MD  nitroGLYCERIN (NITROSTAT) 0.4 MG SL tablet Place 0.4 mg under the tongue every 5 (five) minutes as needed for chest pain (Adminstered by EMS).    [provider]  traZODone (DESYREL) 100 MG tablet Take 100 mg by mouth daily. 09/10/19   [provider]    Family History Family History  Problem Relation Age of Onset   Rheum arthritis Mother    Healthy Father     Social History Social History   Tobacco Use   Smoking status: Current Every Day Smoker    Packs/day: 0.25    Types: Cigarettes   Smokeless tobacco: Never Used  Substance Use Topics   Alcohol use: No   Drug use: No  Allergies   Patient has no known allergies.   Review of Systems Review of Systems  Constitutional: Negative.   Respiratory: Negative.   Cardiovascular: Negative.   Neurological: Negative.   Psychiatric/Behavioral: The patient is nervous/anxious.   ROS: All other negative  Physical Exam Triage Vital Signs ED Triage Vitals  Enc Vitals Group     BP 10/13/19 1120 (!) 143/95     Pulse Rate 10/13/19 1120 87     Resp 10/13/19 1120 16     Temp 10/13/19 1120 98.6 F (37 C)     Temp Source 10/13/19 1120 Oral     SpO2 10/13/19 1120 96 %     Weight --      Height --      Head Circumference --      Peak Flow --       Pain Score 10/13/19 1125 0     Pain Loc --      Pain Edu? --      Excl. in [place]? --    No data found.  Updated Vital Signs BP (!) 143/95 (BP Location: Right Arm)    Pulse 87    Temp 98.6 F (37 C) (Oral)    Resp 16    SpO2 96%   Visual Acuity Right Eye Distance:   Left Eye Distance:   Bilateral Distance:    Right Eye Near:   Left Eye Near:    Bilateral Near:     Physical Exam Vitals and nursing note reviewed.  Constitutional:      General: He is not in acute distress.    Appearance: Normal appearance. He is normal weight. He is not ill-appearing or toxic-appearing.  HENT:     Head: Normocephalic.  Cardiovascular:     Rate and Rhythm: Normal rate and regular rhythm.     Pulses: Normal pulses.     Heart sounds: Normal heart sounds. No murmur.  Pulmonary:     Effort: Pulmonary effort is normal. No respiratory distress.     Breath sounds: Normal breath sounds. No wheezing or rhonchi.  Chest:     Chest wall: No tenderness.  Abdominal:     General: There is no distension.     Palpations: There is no mass.  Skin:    Capillary Refill: Capillary refill takes less than 2 seconds.  Neurological:     General: No focal deficit present.     Mental Status: He is alert and oriented to person, place, and time. Mental status is at baseline.     Cranial Nerves: Cranial nerves are intact. No cranial nerve deficit.     Sensory: Sensation is intact. No sensory deficit.     Motor: Motor function is intact. No weakness.     Coordination: Coordination is intact. Coordination normal.     Gait: Gait is intact. Gait normal.     Deep Tendon Reflexes:     Reflex Scores:      Patellar reflexes are 2+ on the right side and 2+ on the left side. Psychiatric:        Mood and Affect: Mood normal.        Behavior: Behavior normal.        Thought Content: Thought content normal.        Judgment: Judgment normal.      UC Treatments / Results  Labs (all labs ordered are listed, but only abnormal  results are displayed) Labs Reviewed - No data to display  EKG  Radiology No results found.  Procedures Procedures (including critical care time)  Medications Ordered in UC Medications - No data to display  Initial Impression / Assessment and Plan / UC Course  I have reviewed the triage vital signs and the nursing notes.  Pertinent labs & imaging results that were available during my care of the patient were reviewed by me and considered in my medical decision making (see chart for details).   Patient is stable for discharge and in no acute distress.  Patient judgment behavior and mood are normal.  Advised patient to follow-up with primary care for referral to mental health.  To return or go to ED for worsening of symptoms.  Patient verbalized an understanding of the plan of care.  Final Clinical Impressions(s) / UC Diagnoses   Final diagnoses:  Anxiety state     Discharge Instructions     Rest and drink fluids Eat a well-balanced diet, and avoid excessive caffeine intake Continue with hydroxyzine nightly for symptom relief Some things you may try doing to help alleviate your symptoms include: keeping a journal, exercise, talking to a friend or relative, listening to music, going for a walk or hike outside, or other activities that you may find enjoyable PCP assistance initiated Recommending further evaluation and management with PCP Return or go to ER if you have any new or worsening symptoms such as fever, chills, fatigue, worsening shortness of breath, wheezing, chest pain, nausea, vomiting, abdominal pain, changes in bowel or bladder habits, etc...     ED Prescriptions    Medication Sig Dispense Auth. Provider   hydrOXYzine (ATARAX/VISTARIL) 25 MG tablet Take 1 tablet (25 mg total) by mouth every 6 (six) hours. 56 tablet Kameka Whan, Darrelyn Hillock, FNP     PDMP not reviewed this encounter.   Emerson Monte, FNP 10/13/19 1150

## 2019-10-13 NOTE — ED Triage Notes (Addendum)
Pt presents to UC w/ c/o anxiety x4 weeks. Pt states he went to ED in [place] and they prescribed him hydroxizine. Pt states he only has 4 pills left and he has been taking 3 a day. Pt states he has an appt w/ mental health in 2 weeks and would like a refill on the hydroxizine until that appt.   Pt states he had c.diff and was taking vancomycin which he finished 3 weeks ago. Pt states c.diff got better. Pt states he then started taking amoxicillin which he finished 2 weeks ago. Pt states he feels his c.diff symptoms started back 2 days ago and is unsure whether to wait to see if it gets better or if he needs medication for it.

## 2019-10-13 NOTE — Discharge Instructions (Signed)
Rest and drink fluids Eat a well-balanced diet, and avoid excessive caffeine intake Continue with hydroxyzine nightly for symptom relief Some things you may try doing to help alleviate your symptoms include: keeping a journal, exercise, talking to a friend or relative, listening to music, going for a walk or hike outside, or other activities that you may find enjoyable PCP assistance initiated Recommending further evaluation and management with PCP Return or go to ER if you have any new or worsening symptoms such as fever, chills, fatigue, worsening shortness of breath, wheezing, chest pain, nausea, vomiting, abdominal pain, changes in bowel or bladder habits, etc... 

## 2019-10-17 ENCOUNTER — Other Ambulatory Visit: Payer: Self-pay

## 2019-10-17 ENCOUNTER — Encounter: Payer: Self-pay | Admitting: Physician Assistant

## 2019-10-17 ENCOUNTER — Ambulatory Visit: Payer: Worker's Compensation | Admitting: Physician Assistant

## 2019-10-17 VITALS — BP 140/80 | HR 78 | Temp 98.1°F | Wt 151.8 lb

## 2019-10-17 DIAGNOSIS — F17200 Nicotine dependence, unspecified, uncomplicated: Secondary | ICD-10-CM

## 2019-10-17 DIAGNOSIS — Z7689 Persons encountering health services in other specified circumstances: Secondary | ICD-10-CM

## 2019-10-17 DIAGNOSIS — R9431 Abnormal electrocardiogram [ECG] [EKG]: Secondary | ICD-10-CM

## 2019-10-17 DIAGNOSIS — R197 Diarrhea, unspecified: Secondary | ICD-10-CM

## 2019-10-17 DIAGNOSIS — R079 Chest pain, unspecified: Secondary | ICD-10-CM

## 2019-10-17 DIAGNOSIS — F419 Anxiety disorder, unspecified: Secondary | ICD-10-CM

## 2019-10-17 DIAGNOSIS — K5900 Constipation, unspecified: Secondary | ICD-10-CM

## 2019-10-17 DIAGNOSIS — R002 Palpitations: Secondary | ICD-10-CM

## 2019-10-17 NOTE — Patient Instructions (Signed)

## 2019-10-17 NOTE — Progress Notes (Signed)
BP 140/80   Pulse 78   Temp 98.1 F (36.7 C)   Wt 151 lb 12.8 oz (68.9 kg)   SpO2 99%   BMI 21.17 kg/m    Subjective:    Patient ID: Logan Benton, male    DOB: 12-20-1980, 39 y.o.   MRN: [id number]  HPI: Logan Benton is a 39 y.o. male presenting on 10/17/2019 for New Patient (Initial Visit) (pt states he hasn't had a PCP. pt states he was going to [facility] and has appt on wed. to [place] care pt is concerned with his trigger finger on bilateral hands.), Constipation (pt states he has constipation but had loose stools pt is unsure if due to affects to C. diff pt states he was dx with that 1.5 months ago and finished treatment 1 month ago), and Palpitations ("at least a couple times a day"' pt states it is painful and has constant tightness on R chest)   HPI   Pt had a negative covid 19 screening questionnaire   Chief Complaint  Patient presents with  . New Patient (Initial Visit)    pt states he hasn't had a PCP. pt states he was going to [facility] and has appt on wed. to [facility] care pt is concerned with his trigger finger on bilateral hands.  . Constipation    pt states he has constipation but had loose stools pt is unsure if due to affects to C. diff pt states he was dx with that 1.5 months ago and finished treatment 1 month ago  . Palpitations    "at least a couple times a day"' pt states it is painful and has constant tightness on R chest      Pt is 39yo Male who presents today to establish care.  He was seen in ER last week for anxiety  Pt says he was admitted to [facility] and had echo in August last year.  For evaluation of CP.   He was referred to cardiology but he never went.   He has appointment with dr Hoyle Barr ([facility]/psychiatrist)  on Wednesday.  Pt says his c.dif [facility] - that was 09/19/19.  review of notes in Epic reveal that he was never tested for c dif but was treated presumptively.  He was treated with vancomycin.  He says  that now he is having constipation but that when he does move his bowels they are loose.  His Last BM was this morning.  Before that he had BM Saturday.    He is currently having BM usually every other day now.   No blood in stool.   He does report mucus sometimes.    Relevant past medical, surgical, family and social history reviewed and updated as indicated. Interim medical history since our last visit reviewed. Allergies and medications reviewed and updated.   Current Outpatient Medications:  .  hydrOXYzine (ATARAX/VISTARIL) 25 MG tablet, Take 1 tablet (25 mg total) by mouth every 6 (six) hours., Disp: 56 tablet, Rfl: 0 .  Lidocaine-Hydrocortisone Ace 1-1 % CREA, Apply 1 cm topically 2 (two) times daily., Disp: 30 g, Rfl: 0 .  methocarbamol (ROBAXIN) 500 MG tablet, Take 1 tablet (500 mg total) by mouth every 8 (eight) hours as needed for muscle spasms., Disp: 20 tablet, Rfl: 0    Review of Systems  Per HPI unless specifically indicated above     Objective:    BP 140/80   Pulse 78   Temp 98.1  F (36.7 C)   Wt 151 lb 12.8 oz (68.9 kg)   SpO2 99%   BMI 21.17 kg/m   Wt Readings from Last 3 Encounters:  10/17/19 151 lb 12.8 oz (68.9 kg)  10/02/19 148 lb (67.1 kg)  08/14/19 150 lb (68 kg)    Physical Exam Vitals reviewed.  Constitutional:      General: He is not in acute distress.    Appearance: Normal appearance. He is well-developed and normal weight. He is not ill-appearing.  HENT:     Head: Normocephalic and atraumatic.  Eyes:     Conjunctiva/sclera: Conjunctivae normal.     Pupils: Pupils are equal, round, and reactive to light.  Neck:     Thyroid: No thyromegaly.  Cardiovascular:     Rate and Rhythm: Normal rate and regular rhythm.     Pulses:          Radial pulses are 2+ on the right side and 2+ on the left side.     Heart sounds: Normal heart sounds.  Pulmonary:     Effort: Pulmonary effort is normal.     Breath sounds: Normal breath sounds. No wheezing or  rales.  Abdominal:     General: Bowel sounds are normal.     Palpations: Abdomen is soft. There is no mass.     Tenderness: There is no abdominal tenderness.  Musculoskeletal:     Cervical back: Neck supple.     Right lower leg: No edema.     Left lower leg: No edema.  Lymphadenopathy:     Cervical: No cervical adenopathy.  Skin:    General: Skin is warm and dry.     Findings: No rash.  Neurological:     Mental Status: He is alert and oriented to person, place, and time.  Psychiatric:        Behavior: Behavior normal.        Thought Content: Thought content normal.         Assessment & Plan:   Encounter Diagnoses  Name Primary?  . Encounter to establish care Yes  . Diarrhea, unspecified type   . Constipation, unspecified constipation type   . Anxiety   . Abnormal EKG   . Chest pain, unspecified type   . Palpitations   . Tobacco use disorder      -recent notes in Epic including labs and EKG (10/02/19) were reviewed -will check TSH -pt Likely has IBS  -   Will Check stool studies for c.dif. -will Refer to cardiology.  His CP is likely due to anxiety but he does have abnormal EKG -pt is given application for [facility] financial assistance.   -pt to get treatment for anxiety per dr Hoyle Barr -pt to follow up here 1 month.  He is to contact office sooner prn worsening or new symptoms

## 2019-10-18 ENCOUNTER — Encounter: Payer: Self-pay | Admitting: Physician Assistant

## 2019-10-18 ENCOUNTER — Other Ambulatory Visit: Payer: Self-pay | Admitting: Physician Assistant

## 2019-10-21 ENCOUNTER — Other Ambulatory Visit (HOSPITAL_COMMUNITY)
Admission: RE | Admit: 2019-10-21 | Discharge: 2019-10-21 | Disposition: A | Discharge status: Home / Self Care | Payer: Self-pay | Source: Ambulatory Visit | Attending: Physician Assistant | Admitting: Physician Assistant

## 2019-10-21 ENCOUNTER — Other Ambulatory Visit: Payer: Self-pay

## 2019-10-21 DIAGNOSIS — F419 Anxiety disorder, unspecified: Secondary | ICD-10-CM | POA: Insufficient documentation

## 2019-10-21 DIAGNOSIS — R079 Chest pain, unspecified: Secondary | ICD-10-CM | POA: Insufficient documentation

## 2019-10-21 LAB — TSH: TSH: 0.72 u[IU]/mL (ref 0.350–4.500)

## 2019-10-27 ENCOUNTER — Ambulatory Visit: Payer: Self-pay | Admitting: Physician Assistant

## 2019-11-17 ENCOUNTER — Other Ambulatory Visit: Payer: Self-pay

## 2019-11-17 ENCOUNTER — Ambulatory Visit: Payer: Worker's Compensation | Admitting: Physician Assistant

## 2019-11-17 ENCOUNTER — Ambulatory Visit (INDEPENDENT_AMBULATORY_CARE_PROVIDER_SITE_OTHER): Payer: Self-pay | Admitting: Cardiovascular Disease

## 2019-11-17 ENCOUNTER — Encounter: Payer: Self-pay | Admitting: Physician Assistant

## 2019-11-17 ENCOUNTER — Encounter: Payer: Self-pay | Admitting: Cardiovascular Disease

## 2019-11-17 ENCOUNTER — Encounter: Payer: Self-pay | Admitting: *Deleted

## 2019-11-17 VITALS — BP 118/80 | HR 69 | Temp 98.1°F | Wt 161.3 lb

## 2019-11-17 VITALS — BP 116/80 | HR 66 | Ht 71.0 in | Wt 168.0 lb

## 2019-11-17 DIAGNOSIS — F419 Anxiety disorder, unspecified: Secondary | ICD-10-CM

## 2019-11-17 DIAGNOSIS — R079 Chest pain, unspecified: Secondary | ICD-10-CM

## 2019-11-17 DIAGNOSIS — R002 Palpitations: Secondary | ICD-10-CM

## 2019-11-17 DIAGNOSIS — R9431 Abnormal electrocardiogram [ECG] [EKG]: Secondary | ICD-10-CM

## 2019-11-17 NOTE — Progress Notes (Signed)
CARDIOLOGY CONSULT NOTE  Patient ID: MORTON ELDERKIN MRN: [id number] DOB/AGE: 37-Nov-1982 37 y.o.  Admit date: (Not on file) Primary Physician: Soyla Dryer, PA-C  Reason for Consultation: Chest pain and palpitations  HPI: Logan Benton is a 39 y.o. male who is being seen today for the evaluation of chest pain and palpitations at the request of Soyla Dryer, PA-C.   He was apparently hospitalized to [facility] and had an echocardiogram in August 2020.  He was evaluated for anxiety in the ED on 10/13/2019.  He was evaluated for chest pain in the ED on 10/02/2019 at [facility].  Troponins were normal.  Chest x-ray showed no active disease.  I personally reviewed the ECG performed on 10/02/2019 which demonstrated sinus rhythm with right bundle branch block and early repolarization pattern.  He has episodic left-sided chest pains primarily occurring when he lies down at night.  He denies exertional chest pain and dyspnea.  He has palpitations which are seldom in frequency but can occur both at rest and with exertion.  He denies leg swelling, orthopnea, paroxysmal nocturnal dyspnea.  He quit smoking about 2 weeks ago but smoked about a pack of cigarettes daily for roughly 12 years.  No Known Allergies  Current Outpatient Medications  Medication Sig Dispense Refill  . methocarbamol (ROBAXIN) 500 MG tablet Take 1 tablet (500 mg total) by mouth every 8 (eight) hours as needed for muscle spasms. 20 tablet 0   No current facility-administered medications for this visit.    Past Medical History:  Diagnosis Date  . Abscessed tooth   . Anxiety   . C. difficile diarrhea   . Depression   . Hypertension   . Trigger finger     Past Surgical History:  Procedure Laterality Date  . BILATERAL CARPAL TUNNEL RELEASE    . SHOULDER SURGERY Right    x2  . TRIGGER FINGER RELEASE Right 2016    Social History   Socioeconomic History  . Marital status:  Legally Separated    Spouse name: Not on file  . Number of children: Not on file  . Years of education: Not on file  . Highest education level: Not on file  Occupational History  . Not on file  Tobacco Use  . Smoking status: Former Smoker    Packs/day: 0.50    Years: 10.00    Pack years: 5.00    Types: Cigarettes    Quit date: 11/03/2019    Years since quitting: 0.0  . Smokeless tobacco: Never Used  Substance and Sexual Activity  . Alcohol use: No  . Drug use: Yes    Types: Marijuana    Comment: last use dec 2020  . Sexual activity: Not on file  Other Topics Concern  . Not on file  Social History Narrative  . Not on file   Social Determinants of Health   Financial Resource Strain:   . Difficulty of Paying Living Expenses: Not on file  Food Insecurity:   . Worried About Charity fundraiser in the Last Year: Not on file  . Ran Out of Food in the Last Year: Not on file  Transportation Needs:   . Lack of Transportation (Medical): Not on file  . Lack of Transportation (Non-Medical): Not on file  Physical Activity:   . Days of Exercise per Week: Not on file  . Minutes of Exercise per Session: Not on file  Stress:   . Feeling of  Stress : Not on file  Social Connections:   . Frequency of Communication with Friends and Family: Not on file  . Frequency of Social Gatherings with Friends and Family: Not on file  . Attends Religious Services: Not on file  . Active Member of Clubs or Organizations: Not on file  . Attends Archivist Meetings: Not on file  . Marital Status: Not on file  Intimate Partner Violence:   . Fear of Current or Ex-Partner: Not on file  . Emotionally Abused: Not on file  . Physically Abused: Not on file  . Sexually Abused: Not on file     No family history of premature CAD in 1st degree relatives.  Current Meds  Medication Sig  . methocarbamol (ROBAXIN) 500 MG tablet Take 1 tablet (500 mg total) by mouth every 8 (eight) hours as needed for  muscle spasms.      Review of systems complete and found to be negative unless listed above in HPI    Physical exam Blood pressure 116/80, pulse 66, height 5\' 11"  (1.803 m), weight 168 lb (76.2 kg), SpO2 98 %. General: NAD Neck: No JVD, no thyromegaly or thyroid nodule.  Lungs: Clear to auscultation bilaterally with normal respiratory effort. CV: Nondisplaced PMI. Regular rate and rhythm, normal S1/S2, no S3/S4, no murmur.  No peripheral edema.  No carotid bruit.    Abdomen: Soft, nontender, no distention.  Skin: Intact without lesions or rashes.  Neurologic: Alert and oriented x 3.  Psych: Normal affect. Extremities: No clubbing or cyanosis.  HEENT: Normal.   ECG: Most recent ECG reviewed.   Labs: Lab Results  Component Value Date/Time   K 4.2 10/02/2019 10:03 AM   BUN 11 10/02/2019 10:03 AM   CREATININE 0.87 10/02/2019 10:03 AM   ALT 33 04/25/2015 09:19 PM   TSH 0.720 10/21/2019 09:35 AM   HGB 16.2 10/02/2019 10:03 AM     Lipids: No results found for: LDLCALC, LDLDIRECT, CHOL, TRIG, HDL      ASSESSMENT AND PLAN:   1.  Chest pain and palpitations: Symptoms are atypical for ischemic heart disease.  Risk factors include a 12-year history of tobacco use.  He has palpitations both at rest and with exertion.  Some of his symptoms may be attributable to anxiety.  Palpitations last seconds.  He asked about stress testing.  I will obtain a GXT.  I will obtain a copy of the echocardiogram performed at [facility] from August 2020.    Disposition: Follow up in 3 months virtual  Signed: Kate Sable, M.D., F.A.C.C.  11/17/2019, 3:08 PM

## 2019-11-17 NOTE — Patient Instructions (Signed)
Medication Instructions:  Continue all current medications.  Labwork: none  Testing/Procedures:  Your physician has requested that you have an exercise tolerance test. For further information please visit [email or web address]. Please also follow instruction sheet, as given.  Office will contact with results via phone or letter.    Follow-Up: 3 months   Any Other Special Instructions Will Be Listed Below (If Applicable). You will need to have covid screening prior to the GXT.    If you need a refill on your cardiac medications before your next appointment, please call your pharmacy.

## 2019-11-17 NOTE — Patient Instructions (Signed)
Cardiology Phone: [phone]

## 2019-11-17 NOTE — Progress Notes (Signed)
BP 118/80   Pulse 69   Temp 98.1 F (36.7 C)   Wt 161 lb 4.8 oz (73.2 kg)   SpO2 95%   BMI 22.50 kg/m    Subjective:    Patient ID: Logan Benton, male    DOB: [birth year], 39 y.o.   MRN: [id number]  HPI: Logan Benton is a 39 y.o. male presenting on 11/17/2019 for Follow-up   HPI    Pt had a negative covid 19 screening questionnaire.   Pt is feeling much better today.  He says situations have improved  He spoke with [facility].  He is working on his financial assistance.  Pt quit smoking  He denies CP, sob.   He says his diarrhea is improved.  He believes that the vancomycin he was given "tore up my system".     Relevant past medical, surgical, family and social history reviewed and updated as indicated. Interim medical history since our last visit reviewed. Allergies and medications reviewed and updated.    Current Outpatient Medications:  .  methocarbamol (ROBAXIN) 500 MG tablet, Take 1 tablet (500 mg total) by mouth every 8 (eight) hours as needed for muscle spasms., Disp: 20 tablet, Rfl: 0    Review of Systems  Per HPI unless specifically indicated above     Objective:    BP 118/80   Pulse 69   Temp 98.1 F (36.7 C)   Wt 161 lb 4.8 oz (73.2 kg)   SpO2 95%   BMI 22.50 kg/m   Wt Readings from Last 3 Encounters:  11/17/19 161 lb 4.8 oz (73.2 kg)  10/17/19 151 lb 12.8 oz (68.9 kg)  10/02/19 148 lb (67.1 kg)    Physical Exam Vitals reviewed.  Constitutional:      General: He is not in acute distress.    Appearance: Normal appearance. He is well-developed and normal weight. He is not ill-appearing.  HENT:     Head: Normocephalic and atraumatic.  Cardiovascular:     Rate and Rhythm: Normal rate and regular rhythm.  Pulmonary:     Effort: Pulmonary effort is normal.     Breath sounds: Normal breath sounds. No wheezing.  Abdominal:     General: Bowel sounds are normal.     Palpations: Abdomen is soft.     Tenderness: There is no  abdominal tenderness.  Musculoskeletal:     Cervical back: Neck supple.     Right lower leg: No edema.     Left lower leg: No edema.  Lymphadenopathy:     Cervical: No cervical adenopathy.  Skin:    General: Skin is warm and dry.  Neurological:     Mental Status: He is alert and oriented to person, place, and time.  Psychiatric:        Attention and Perception: Attention normal.        Speech: Speech normal.        Behavior: Behavior normal. Behavior is cooperative.     Results for orders placed or performed during the hospital encounter of 10/21/19  TSH  Result Value Ref Range   TSH 0.720 0.350 - 4.500 uIU/mL      Assessment & Plan:   Encounter Diagnoses  Name Primary?  Marland Kitchen Anxiety Yes  . Abnormal EKG      -reviewed labs with pt  -pt congratulated on stopping smoking! -pt to continue with [facility] -pt encouraged to get financial application completed -discussed with pt that cardiology called him to schedule appointment.  He is given number to call them back.  -pt will follow up with a virtual appointment in one month to make sure things going well.  He is to contact office sooner prn

## 2019-11-21 ENCOUNTER — Encounter: Payer: Self-pay | Admitting: *Deleted

## 2019-11-22 ENCOUNTER — Other Ambulatory Visit: Payer: Self-pay

## 2019-11-22 ENCOUNTER — Other Ambulatory Visit (HOSPITAL_COMMUNITY)
Admission: RE | Admit: 2019-11-22 | Discharge: 2019-11-22 | Disposition: A | Discharge status: Home / Self Care | Payer: HRSA Program | Source: Ambulatory Visit | Attending: Cardiovascular Disease | Admitting: Cardiovascular Disease

## 2019-11-22 DIAGNOSIS — Z01812 Encounter for preprocedural laboratory examination: Secondary | ICD-10-CM | POA: Diagnosis present

## 2019-11-22 DIAGNOSIS — Z20822 Contact with and (suspected) exposure to covid-19: Secondary | ICD-10-CM | POA: Diagnosis not present

## 2019-11-23 ENCOUNTER — Telehealth: Payer: Self-pay | Admitting: *Deleted

## 2019-11-23 ENCOUNTER — Encounter (HOSPITAL_COMMUNITY): Payer: Self-pay

## 2019-11-23 LAB — SARS CORONAVIRUS 2 (TAT 6-24 HRS): SARS Coronavirus 2: NEGATIVE

## 2019-11-23 NOTE — Telephone Encounter (Signed)
Laurine Blazer, [place]  [id number] AM EST    Left message to return call.

## 2019-11-23 NOTE — Telephone Encounter (Signed)
Laurine Blazer, [place]  [id number] [id number] AM EST    Patient notified. Copy to pmd.

## 2019-11-23 NOTE — Telephone Encounter (Signed)
-----   Message from Herminio Commons, MD sent at 11/23/2019  8:56 AM EST ----- negative

## 2019-11-25 ENCOUNTER — Ambulatory Visit (HOSPITAL_COMMUNITY)
Admission: RE | Admit: 2019-11-25 | Discharge: 2019-11-25 | Disposition: A | Discharge status: Home / Self Care | Payer: Self-pay | Source: Ambulatory Visit | Attending: Cardiovascular Disease | Admitting: Cardiovascular Disease

## 2019-11-25 ENCOUNTER — Other Ambulatory Visit: Payer: Self-pay

## 2019-11-25 DIAGNOSIS — R079 Chest pain, unspecified: Secondary | ICD-10-CM

## 2019-11-25 LAB — EXERCISE TOLERANCE TEST
Estimated workload: 13.4 METS
Exercise duration (min): 10 min
Exercise duration (sec): 15 s
MPHR: 187 {beats}/min
Peak HR: 157 {beats}/min
Percent HR: 85 %
RPE: 13
Rest HR: 65 {beats}/min

## 2019-11-30 ENCOUNTER — Telehealth: Payer: Self-pay | Admitting: *Deleted

## 2019-11-30 NOTE — Telephone Encounter (Signed)
-----   Message from Herminio Commons, MD sent at 11/27/2019  7:08 PM EST ----- Obtain copy of echo done at [facility] in August 2020. Nothing to suggest any blockages. Excellent exercise tolerance. If he is still having palpitations, obtain 30 day event monitor.

## 2019-11-30 NOTE — Telephone Encounter (Signed)
Laurine Blazer, [place]  [id number] AM EST    Patient notified. Copy to pmd. Follow up scheduled for May with Dr. Bronson Ing. Palpitations are not happening frequently - if anything changes he will let us know. Echo form [facility] was requested already on 11/21/2019.

## 2019-12-08 ENCOUNTER — Encounter: Payer: Self-pay | Admitting: Physician Assistant

## 2019-12-12 ENCOUNTER — Telehealth: Payer: Self-pay | Admitting: Cardiovascular Disease

## 2019-12-12 NOTE — Telephone Encounter (Signed)
Patient was responding to a MyChart message from [place] in regards to a 30 day monitor. He would like to proceed with the monitor. He has not heard anything via phone or a phone call since responding to the [employer].

## 2019-12-12 NOTE — Telephone Encounter (Signed)
Message addressed via mychart 

## 2019-12-15 ENCOUNTER — Ambulatory Visit: Payer: Worker's Compensation | Admitting: Physician Assistant

## 2019-12-15 DIAGNOSIS — F419 Anxiety disorder, unspecified: Secondary | ICD-10-CM

## 2019-12-15 DIAGNOSIS — Z2821 Immunization not carried out because of patient refusal: Secondary | ICD-10-CM

## 2019-12-15 MED ORDER — HYDROXYZINE PAMOATE 25 MG PO CAPS: 25.0000 mg | ORAL_CAPSULE | Freq: Three times a day (TID) | ORAL | 0 refills | Status: DC | PRN

## 2019-12-15 NOTE — Progress Notes (Signed)
   There were no vitals taken for this visit.   Subjective:    Patient ID: Logan Benton, male    DOB: 1981/01/03, 39 y.o.   MRN: [id number]  HPI: Logan Benton is a 39 y.o. male presenting on 12/15/2019 for No chief complaint on file.   HPI   This is a telemedicine appointment through [employer] due to coronavirus pandemic.  I connected with  Logan Benton on 12/15/19 by a video enabled telemedicine application and verified that I am speaking with the correct person using two identifiers.   I discussed the limitations of evaluation and management by telemedicine. The patient expressed understanding and agreed to proceed.   Pt is 38yoM with anxiety.    Pt says he contacted [facility] and they wanted to wait until he gets cleared by cardiology  Pt has been seen by cardiology and had normal EST.    Per Epic, it appears that he is in process of getting a 30 day event monitor.   He is still off smoking- using gum    Relevant past medical, surgical, family and social history reviewed and updated as indicated. Interim medical history since our last visit reviewed. Allergies and medications reviewed and updated.   Current Outpatient Medications:  .  HYDROXYZINE HCL PO, Take by mouth., Disp: , Rfl:  .  methocarbamol (ROBAXIN) 500 MG tablet, Take 1 tablet (500 mg total) by mouth every 8 (eight) hours as needed for muscle spasms. (Patient not taking: Reported on 12/15/2019), Disp: 20 tablet, Rfl: 0     Review of Systems  Per HPI unless specifically indicated above     Objective:    There were no vitals taken for this visit.  Wt Readings from Last 3 Encounters:  11/17/19 168 lb (76.2 kg)  11/17/19 161 lb 4.8 oz (73.2 kg)  10/17/19 151 lb 12.8 oz (68.9 kg)    Physical Exam Constitutional:      General: He is not in acute distress.    Appearance: Normal appearance. He is not ill-appearing.  HENT:     Head: Normocephalic and atraumatic.  Pulmonary:     Effort: No  respiratory distress.  Neurological:     Mental Status: He is alert and oriented to person, place, and time.  Psychiatric:        Attention and Perception: Attention normal.        Speech: Speech normal.        Behavior: Behavior is cooperative.             Assessment & Plan:     Encounter Diagnoses  Name Primary?  Marland Kitchen Anxiety Yes  . COVID-19 virus vaccination declined       -pt requests refill of hydroxyzine for anxiety.  Discussed that it will be provided this one time but that in the future he needs to contact [facility] for management of his anxiety as discussed -Pt declined covid vaccination.  He says he will think about it -pt to Continue with cardiology per their recomendations -pt to Follow up here 6 months.  He is to contact office sooner prn

## 2019-12-16 ENCOUNTER — Encounter: Payer: Self-pay | Admitting: Physician Assistant

## 2019-12-20 ENCOUNTER — Other Ambulatory Visit: Payer: Self-pay | Admitting: *Deleted

## 2019-12-20 ENCOUNTER — Encounter: Payer: Self-pay | Admitting: *Deleted

## 2019-12-20 ENCOUNTER — Ambulatory Visit (INDEPENDENT_AMBULATORY_CARE_PROVIDER_SITE_OTHER): Payer: Self-pay

## 2019-12-20 DIAGNOSIS — R002 Palpitations: Secondary | ICD-10-CM

## 2019-12-23 ENCOUNTER — Encounter: Payer: Self-pay | Admitting: Physician Assistant

## 2020-01-16 ENCOUNTER — Encounter (HOSPITAL_COMMUNITY): Payer: Self-pay | Admitting: *Deleted

## 2020-01-16 ENCOUNTER — Other Ambulatory Visit: Payer: Self-pay

## 2020-01-16 ENCOUNTER — Emergency Department (HOSPITAL_COMMUNITY): Payer: Self-pay

## 2020-01-16 ENCOUNTER — Emergency Department (HOSPITAL_COMMUNITY)
Admission: EM | Admit: 2020-01-16 | Discharge: 2020-01-16 | Disposition: A | Discharge status: Home / Self Care | Payer: Self-pay | Attending: Emergency Medicine | Admitting: Emergency Medicine

## 2020-01-16 DIAGNOSIS — K2900 Acute gastritis without bleeding: Secondary | ICD-10-CM | POA: Insufficient documentation

## 2020-01-16 DIAGNOSIS — I10 Essential (primary) hypertension: Secondary | ICD-10-CM | POA: Insufficient documentation

## 2020-01-16 DIAGNOSIS — Z87891 Personal history of nicotine dependence: Secondary | ICD-10-CM | POA: Insufficient documentation

## 2020-01-16 DIAGNOSIS — Z79899 Other long term (current) drug therapy: Secondary | ICD-10-CM | POA: Insufficient documentation

## 2020-01-16 DIAGNOSIS — R1012 Left upper quadrant pain: Secondary | ICD-10-CM

## 2020-01-16 LAB — CBC WITH DIFFERENTIAL/PLATELET
Abs Immature Granulocytes: 0.01 10*3/uL (ref 0.00–0.07)
Basophils Absolute: 0 10*3/uL (ref 0.0–0.1)
Basophils Relative: 1 %
Eosinophils Absolute: 0.1 10*3/uL (ref 0.0–0.5)
Eosinophils Relative: 1 %
HCT: 44.3 % (ref 39.0–52.0)
Hemoglobin: 15.5 g/dL (ref 13.0–17.0)
Immature Granulocytes: 0 %
Lymphocytes Relative: 44 %
Lymphs Abs: 2.6 10*3/uL (ref 0.7–4.0)
MCH: 32.2 pg (ref 26.0–34.0)
MCHC: 35 g/dL (ref 30.0–36.0)
MCV: 92.1 fL (ref 80.0–100.0)
Monocytes Absolute: 0.6 10*3/uL (ref 0.1–1.0)
Monocytes Relative: 10 %
Neutro Abs: 2.6 10*3/uL (ref 1.7–7.7)
Neutrophils Relative %: 44 %
Platelets: 250 10*3/uL (ref 150–400)
RBC: 4.81 MIL/uL (ref 4.22–5.81)
RDW: 12.3 % (ref 11.5–15.5)
WBC: 5.9 10*3/uL (ref 4.0–10.5)
nRBC: 0 % (ref 0.0–0.2)

## 2020-01-16 LAB — COMPREHENSIVE METABOLIC PANEL
ALT: 25 U/L (ref 0–44)
AST: 18 U/L (ref 15–41)
Albumin: 4.4 g/dL (ref 3.5–5.0)
Alkaline Phosphatase: 42 U/L (ref 38–126)
Anion gap: 9 (ref 5–15)
BUN: 12 mg/dL (ref 6–20)
CO2: 23 mmol/L (ref 22–32)
Calcium: 9.1 mg/dL (ref 8.9–10.3)
Chloride: 104 mmol/L (ref 98–111)
Creatinine, Ser: 0.89 mg/dL (ref 0.61–1.24)
GFR calc Af Amer: >60 mL/min (ref >60)
GFR calc non Af Amer: >60 mL/min (ref >60)
Glucose, Bld: 96 mg/dL (ref 70–99)
Potassium: 4 mmol/L (ref 3.5–5.1)
Sodium: 136 mmol/L (ref 135–145)
Total Bilirubin: 0.8 mg/dL (ref 0.3–1.2)
Total Protein: 7 g/dL (ref 6.5–8.1)

## 2020-01-16 LAB — URINALYSIS, ROUTINE W REFLEX MICROSCOPIC
Bilirubin Urine: NEGATIVE
Glucose, UA: NEGATIVE mg/dL
Hgb urine dipstick: NEGATIVE
Ketones, ur: NEGATIVE mg/dL
Leukocytes,Ua: NEGATIVE
Nitrite: NEGATIVE
Protein, ur: NEGATIVE mg/dL
Specific Gravity, Urine: 1.005 (ref 1.005–1.030)
pH: 6 (ref 5.0–8.0)

## 2020-01-16 LAB — LIPASE, BLOOD: Lipase: 19 U/L (ref 11–51)

## 2020-01-16 MED ORDER — IOHEXOL 300 MG/ML  SOLN
100.0000 mL | Freq: Once | INTRAMUSCULAR | Status: AC | PRN
  Administered 2020-01-16: 100 mL via INTRAVENOUS

## 2020-01-16 MED ORDER — ONDANSETRON HCL 4 MG/2ML IJ SOLN
4.0000 mg | Freq: Once | INTRAMUSCULAR | Status: AC
  Administered 2020-01-16: 4 mg via INTRAVENOUS
  Filled 2020-01-16: qty 2

## 2020-01-16 MED ORDER — OMEPRAZOLE 20 MG PO CPDR: 20.0000 mg | DELAYED_RELEASE_CAPSULE | Freq: Every day | ORAL | 0 refills | Status: DC

## 2020-01-16 MED ORDER — MORPHINE SULFATE (PF) 4 MG/ML IV SOLN
4.0000 mg | Freq: Once | INTRAVENOUS | Status: AC
  Administered 2020-01-16: 4 mg via INTRAVENOUS
  Filled 2020-01-16: qty 1

## 2020-01-16 MED ORDER — FAMOTIDINE 20 MG PO TABS: 20.0000 mg | ORAL_TABLET | Freq: Two times a day (BID) | ORAL | 0 refills | Status: DC

## 2020-01-16 NOTE — ED Triage Notes (Signed)
C/o abdominal pain for a week, states pain is getting worse and is really  painful after eating

## 2020-01-16 NOTE — Discharge Instructions (Addendum)
Your labs are normal today but your CT scan suggests gastritis (inflammation of the lining of your stomach) which can explain your symptoms.  I recommend taking the medicine prescribed to help relieve and heal your symptoms.  Followup with your primary provider (or your new provider at the clinic in [place]) if this does not help improve your symptoms. (it will take several days before you notice a significant improvement taking the omeprazole.  Pepcid can also be taken and can help with more immediate relief (but will not heal the inflammation of your stomach).  Avoid foods as outlined below.

## 2020-01-16 NOTE — ED Provider Notes (Signed)
[facility] EMERGENCY DEPARTMENT Provider Note   CSN: [id number] Arrival date & time: 01/16/20  1258     History Chief Complaint  Patient presents with  . Abdominal Pain    Logan Benton is a 39 y.o. male with a history of HTN, presenting with escalating abdominal pain for the past week, worsened after eating a meal, described as a bloated, full feeling, burning  with sharp pain in the left upper abdomen after eating meals.  Sometimes associated with nausea, no emesis, also no changes in bowels, no constipation, diarrhea or bloody stools. He denies acid reflux or dysphagia symptoms. Denies fevers or chills, no dysuria or hematuria, no flank pain. Reports poor PO intake for the past week over concern for escalating symptoms.  He has taken pepto bismol without relief of symptoms.   The history is provided by the patient.     The history is provided by the patient.       Past Medical History:  Diagnosis Date  . Abscessed tooth   . Anxiety   . C. difficile diarrhea   . Depression   . Hypertension   . Trigger finger     There are no problems to display for this patient.   Past Surgical History:  Procedure Laterality Date  . BILATERAL CARPAL TUNNEL RELEASE    . SHOULDER SURGERY Right    x2  . TRIGGER FINGER RELEASE Right 2016       Family History  Problem Relation Age of Onset  . Rheum arthritis Mother   . Kidney disease Father     Social History   Tobacco Use  . Smoking status: Former Smoker    Packs/day: 0.50    Years: 10.00    Pack years: 5.00    Types: Cigarettes    Quit date: 11/03/2019    Years since quitting: 0.2  . Smokeless tobacco: Never Used  Substance Use Topics  . Alcohol use: No  . Drug use: Yes    Types: Marijuana    Comment: last use dec 2020    Home Medications Prior to Admission medications   Medication Sig Start Date End Date Taking? Authorizing Provider  bismuth subsalicylate (PEPTO BISMOL) 262 MG/15ML suspension Take 15 mLs  by mouth every 6 (six) hours as needed.   Yes [provider]  hydrOXYzine (VISTARIL) 25 MG capsule Take 1 capsule (25 mg total) by mouth 3 (three) times daily as needed. Patient not taking: Reported on 01/16/2020 12/15/19   Soyla Dryer, PA-C  methocarbamol (ROBAXIN) 500 MG tablet Take 1 tablet (500 mg total) by mouth every 8 (eight) hours as needed for muscle spasms. Patient not taking: Reported on 12/15/2019 08/14/19   Long, Wonda Olds, MD  omeprazole (PRILOSEC) 20 MG capsule Take 1 capsule (20 mg total) by mouth daily. 01/16/20   Evalee Jefferson, PA-C  famotidine (PEPCID) 20 MG tablet Take 1 tablet (20 mg total) by mouth 2 (two) times daily. 01/16/20 01/16/20  Evalee Jefferson, PA-C    Allergies    Patient has no known allergies.  Review of Systems   Review of Systems  Constitutional: Negative for chills and fever.  HENT: Negative for congestion and sore throat.   Eyes: Negative.   Respiratory: Negative for chest tightness and shortness of breath.   Cardiovascular: Negative for chest pain.  Gastrointestinal: Positive for abdominal distention, abdominal pain and nausea. Negative for constipation, diarrhea and vomiting.  Genitourinary: Negative.   Musculoskeletal: Negative for arthralgias, joint swelling and neck pain.  Skin: Negative.  Negative for rash and wound.  Neurological: Negative for dizziness, weakness, light-headedness, numbness and headaches.  Psychiatric/Behavioral: Negative.     Physical Exam Updated Vital Signs BP 124/75 (BP Location: Right Arm)   Pulse 60   Temp 98.3 F (36.8 C)   Resp 12   Ht 5\' 11"  (1.803 m)   Wt 68 kg   SpO2 100%   BMI 20.92 kg/m   Physical Exam Vitals and nursing note reviewed.  Constitutional:      Appearance: He is well-developed.  HENT:     Head: Normocephalic and atraumatic.  Eyes:     Conjunctiva/sclera: Conjunctivae normal.  Cardiovascular:     Rate and Rhythm: Normal rate and regular rhythm.     Heart sounds: Normal heart  sounds.  Pulmonary:     Effort: Pulmonary effort is normal.     Breath sounds: Normal breath sounds. No wheezing.  Abdominal:     General: Bowel sounds are normal.     Palpations: Abdomen is soft.     Tenderness: There is abdominal tenderness in the epigastric area and left upper quadrant. There is no guarding or rebound. Negative signs include Murphy's sign.  Musculoskeletal:        General: Normal range of motion.     Cervical back: Normal range of motion.  Skin:    General: Skin is warm and dry.  Neurological:     Mental Status: He is alert.     ED Results / Procedures / Treatments   Labs (all labs ordered are listed, but only abnormal results are displayed) Labs Reviewed  URINALYSIS, ROUTINE W REFLEX MICROSCOPIC - Abnormal; Notable for the following components:      Result Value   Color, Urine STRAW (*)    All other components within normal limits  CBC WITH DIFFERENTIAL/PLATELET  COMPREHENSIVE METABOLIC PANEL  LIPASE, BLOOD    EKG None  Radiology CT ABDOMEN PELVIS W CONTRAST  Result Date: 01/16/2020 CLINICAL DATA:  Left-sided abdominal pain for 1 week EXAM: CT ABDOMEN AND PELVIS WITH CONTRAST TECHNIQUE: Multidetector CT imaging of the abdomen and pelvis was performed using the standard protocol following bolus administration of intravenous contrast. CONTRAST:  157mL OMNIPAQUE IOHEXOL 300 MG/ML  SOLN COMPARISON:  None. FINDINGS: Lower chest: No acute abnormality. Hepatobiliary: No focal liver abnormality is seen. No gallstones, gallbladder wall thickening, or biliary dilatation. Pancreas: Unremarkable. No pancreatic ductal dilatation or surrounding inflammatory changes. Spleen: Normal in size without focal abnormality. Adrenals/Urinary Tract: Adrenal glands are within normal limits. Kidneys demonstrate a normal enhancement pattern bilaterally. No renal calculi or obstructive changes are seen. The bladder is well distended. Stomach/Bowel: The appendix is within normal limits.  The colon is predominately decompressed without inflammatory change. Mild hyperemia is noted in the midportion of the stomach as well as the fundus which may represent a component of gastritis. Vascular/Lymphatic: No significant vascular findings are present. No enlarged abdominal or pelvic lymph nodes. Reproductive: Prostate is unremarkable. Other: No abdominal wall hernia or abnormality. No abdominopelvic ascites. Musculoskeletal: No acute or significant osseous findings. IMPRESSION: Mild hyperemia and mucosal thickening within the stomach suggestive of gastritis. No other focal abnormality is seen. Electronically Signed   By: Inez Catalina M.D.   On: 01/16/2020 15:51    Procedures Procedures (including critical care time)  Medications Ordered in ED Medications  ondansetron (ZOFRAN) injection 4 mg (4 mg Intravenous Given 01/16/20 1419)  morphine 4 MG/ML injection 4 mg (4 mg Intravenous Given 01/16/20 1419)  iohexol (  OMNIPAQUE) 300 MG/ML solution 100 mL (100 mLs Intravenous Contrast Given 01/16/20 1536)    ED Course  I have reviewed the triage vital signs and the nursing notes.  Pertinent labs & imaging results that were available during my care of the patient were reviewed by me and considered in my medical decision making (see chart for details).    MDM Rules/Calculators/A&P                      Labs and imaging reviewed and discussed with pt.  Sx and CT imaging suggesting gastritis.  No gallstones, liver or pancreas abnormality found and normal bgp with no evidence for obstruction or inflammatory bowel changes.  He was placed on omeprazole and also suggested pepcid for breakthrough sx and for use until the omeprazole becomes effective.  He was given dietary and other recommendations for tx of gastritis/gerd.  He is a pt of [facility] or [place] [facility]  Suggested he may need GI referral if sx persist and to get rechecked by his primary provider for recheck and to consider GI referral if  needed.   The patient appears reasonably screened and/or stabilized for discharge and I doubt any other medical condition or other [facility] requiring further screening, evaluation, or treatment in the ED at this time prior to discharge.  Final Clinical Impression(s) / ED Diagnoses Final diagnoses:  Left upper quadrant abdominal pain  Acute gastritis without hemorrhage, unspecified gastritis type    Rx / DC Orders ED Discharge Orders         Ordered    famotidine (PEPCID) 20 MG tablet  2 times daily,   Status:  Discontinued     01/16/20 1653    omeprazole (PRILOSEC) 20 MG capsule  Daily     01/16/20 1655           Evalee Jefferson, Hershal Coria 01/17/20 0802    Davonna Belling, MD 01/18/20 1553

## 2020-02-01 ENCOUNTER — Telehealth: Payer: Self-pay | Admitting: *Deleted

## 2020-02-01 NOTE — Telephone Encounter (Signed)
Laurine Blazer, [place]  [phone] [id number] PM EDT    Patient notified. Copy to pcp.    Laurine Blazer, [place]  [phone] PM EDT    Left message to return call.   Laurine Blazer, [place]  [id number] PM EDT    No answer.   Herminio Commons, MD  01/24/2020 1:59 PM EDT     Sinus rhythm, sinus arrhythmia, sinus bradycardia, and sinus tachycardia.  Symptoms corresponded with all of the above.  I suspect his symptoms are related to anxiety. He should follow-up with PCP. He can follow-up with me as needed. Can cancel future appointment.

## 2020-02-03 ENCOUNTER — Ambulatory Visit: Payer: Self-pay | Attending: Internal Medicine

## 2020-02-03 ENCOUNTER — Other Ambulatory Visit: Payer: Self-pay

## 2020-02-03 DIAGNOSIS — Z20822 Contact with and (suspected) exposure to covid-19: Secondary | ICD-10-CM | POA: Insufficient documentation

## 2020-02-04 LAB — SARS-COV-2, NAA 2 DAY TAT

## 2020-02-04 LAB — NOVEL CORONAVIRUS, NAA: SARS-CoV-2, NAA: NOT DETECTED

## 2020-02-21 ENCOUNTER — Ambulatory Visit: Payer: Self-pay | Admitting: Cardiovascular Disease

## 2020-04-16 ENCOUNTER — Other Ambulatory Visit: Payer: Self-pay

## 2020-04-16 ENCOUNTER — Encounter (HOSPITAL_COMMUNITY): Payer: Self-pay

## 2020-04-16 ENCOUNTER — Emergency Department (HOSPITAL_COMMUNITY)
Admission: EM | Admit: 2020-04-16 | Discharge: 2020-04-16 | Disposition: A | Discharge status: Home / Self Care | Payer: Self-pay | Attending: Emergency Medicine | Admitting: Emergency Medicine

## 2020-04-16 ENCOUNTER — Emergency Department (HOSPITAL_COMMUNITY): Payer: Self-pay

## 2020-04-16 DIAGNOSIS — R1013 Epigastric pain: Secondary | ICD-10-CM | POA: Insufficient documentation

## 2020-04-16 DIAGNOSIS — Z87891 Personal history of nicotine dependence: Secondary | ICD-10-CM | POA: Insufficient documentation

## 2020-04-16 DIAGNOSIS — I10 Essential (primary) hypertension: Secondary | ICD-10-CM | POA: Insufficient documentation

## 2020-04-16 LAB — COMPREHENSIVE METABOLIC PANEL
ALT: 33 U/L (ref 0–44)
AST: 22 U/L (ref 15–41)
Albumin: 4.5 g/dL (ref 3.5–5.0)
Alkaline Phosphatase: 49 U/L (ref 38–126)
Anion gap: 10 (ref 5–15)
BUN: 11 mg/dL (ref 6–20)
CO2: 26 mmol/L (ref 22–32)
Calcium: 9.8 mg/dL (ref 8.9–10.3)
Chloride: 100 mmol/L (ref 98–111)
Creatinine, Ser: 0.86 mg/dL (ref 0.61–1.24)
GFR calc Af Amer: >60 mL/min (ref >60)
GFR calc non Af Amer: >60 mL/min (ref >60)
Glucose, Bld: 103 mg/dL — ABNORMAL HIGH (ref 70–99)
Potassium: 4 mmol/L (ref 3.5–5.1)
Sodium: 136 mmol/L (ref 135–145)
Total Bilirubin: 0.8 mg/dL (ref 0.3–1.2)
Total Protein: 7.4 g/dL (ref 6.5–8.1)

## 2020-04-16 LAB — CBC
HCT: 47.1 % (ref 39.0–52.0)
Hemoglobin: 15.7 g/dL (ref 13.0–17.0)
MCH: 31.2 pg (ref 26.0–34.0)
MCHC: 33.3 g/dL (ref 30.0–36.0)
MCV: 93.6 fL (ref 80.0–100.0)
Platelets: 257 10*3/uL (ref 150–400)
RBC: 5.03 MIL/uL (ref 4.22–5.81)
RDW: 12.3 % (ref 11.5–15.5)
WBC: 6.9 10*3/uL (ref 4.0–10.5)
nRBC: 0 % (ref 0.0–0.2)

## 2020-04-16 LAB — LIPASE, BLOOD: Lipase: 69 U/L — ABNORMAL HIGH (ref 11–51)

## 2020-04-16 MED ORDER — ONDANSETRON HCL 4 MG/2ML IJ SOLN
4.0000 mg | Freq: Once | INTRAMUSCULAR | Status: AC
  Administered 2020-04-16: 4 mg via INTRAVENOUS
  Filled 2020-04-16: qty 2

## 2020-04-16 MED ORDER — IOHEXOL 300 MG/ML  SOLN
100.0000 mL | Freq: Once | INTRAMUSCULAR | Status: AC | PRN
  Administered 2020-04-16: 100 mL via INTRAVENOUS

## 2020-04-16 MED ORDER — SODIUM CHLORIDE 0.9 % IV BOLUS
1000.0000 mL | Freq: Once | INTRAVENOUS | Status: AC
  Administered 2020-04-16: 1000 mL via INTRAVENOUS

## 2020-04-16 MED ORDER — FENTANYL CITRATE (PF) 100 MCG/2ML IJ SOLN
50.0000 ug | Freq: Once | INTRAMUSCULAR | Status: AC
  Administered 2020-04-16: 50 ug via INTRAVENOUS
  Filled 2020-04-16: qty 2

## 2020-04-16 MED ORDER — HYDROCODONE-ACETAMINOPHEN 5-325 MG PO TABS: ORAL_TABLET | ORAL | 0 refills | Status: DC

## 2020-04-16 MED ORDER — ONDANSETRON HCL 4 MG/2ML IJ SOLN
INTRAMUSCULAR | Status: AC
  Filled 2020-04-16: qty 2

## 2020-04-16 MED ORDER — FENTANYL CITRATE (PF) 100 MCG/2ML IJ SOLN
INTRAMUSCULAR | Status: AC
  Filled 2020-04-16: qty 2

## 2020-04-16 NOTE — ED Triage Notes (Signed)
Pt reports that he has had abdominal pain for 2 weeks. Pt reports stool has been yellow in color with foul smell also reports blood, coffee grounds at times. Pt reports approx 12-15 lbs weight loss in 2 weeks

## 2020-04-16 NOTE — Discharge Instructions (Addendum)
Call Dr. Roseanne Kaufman office tomorrow to schedule a follow-up appointment. Avoid taking anti-inflammatory pain medications such as ibuprofen, aspirin, or Aleve. Continue taking your Prilosec as directed.

## 2020-04-16 NOTE — ED Provider Notes (Signed)
[facility] EMERGENCY DEPARTMENT Provider Note   CSN: [id number] Arrival date & time: 04/16/20  1027     History Chief Complaint  Patient presents with  . Abdominal Pain    Logan Benton is a 39 y.o. male.  HPI      Logan Benton is a 39 y.o. male who presents to the Emergency Department complaining of upper abdominal pain for 2 weeks. He states pain is been persistent and nonradiating. He describes the pain as sharp in quality. His abdominal pain has been associated with decreased appetite and 10 to 15 pound weight loss in 2 weeks. He also reports foul-smelling stool that has been yellow in color and mixed with mucus. At times he also endorses dark stools with consistency of coffee grounds. Pain is worse with food intake. He denies fever, chills, back pain, chest pain, shortness of breath. He also denies alcohol or drug use. Occasionally takes Tylenol, but denies taking anti-inflammatories.    Past Medical History:  Diagnosis Date  . Abscessed tooth   . Anxiety   . C. difficile diarrhea   . Depression   . Hypertension   . Trigger finger     There are no problems to display for this patient.   Past Surgical History:  Procedure Laterality Date  . BILATERAL CARPAL TUNNEL RELEASE    . SHOULDER SURGERY Right    x2  . TRIGGER FINGER RELEASE Right 2016       Family History  Problem Relation Age of Onset  . Rheum arthritis Mother   . Kidney disease Father     Social History   Tobacco Use  . Smoking status: Former Smoker    Packs/day: 0.50    Years: 10.00    Pack years: 5.00    Types: Cigarettes    Quit date: 11/03/2019    Years since quitting: 0.4  . Smokeless tobacco: Never Used  Vaping Use  . Vaping Use: Former  . Quit date: 03/07/2019  Substance Use Topics  . Alcohol use: No  . Drug use: Yes    Types: Marijuana    Home Medications Prior to Admission medications   Medication Sig Start Date End Date Taking? Authorizing Provider  baclofen  (LIORESAL) 10 MG tablet Take 10 mg by mouth 2 (two) times daily.  04/10/20  Yes [provider]  famotidine (PEPCID) 20 MG tablet Take 20 mg by mouth daily.   Yes [provider]  FLUoxetine (PROZAC) 10 MG capsule Take 10 mg by mouth in the morning.  04/10/20  Yes [provider]  FLUoxetine (PROZAC) 20 MG capsule Take 20 mg by mouth in the morning.  03/25/20  Yes [provider]  hydrOXYzine (VISTARIL) 25 MG capsule Take 1 capsule (25 mg total) by mouth 3 (three) times daily as needed. Patient not taking: Reported on 01/16/2020 12/15/19   Soyla Dryer, PA-C  omeprazole (PRILOSEC) 20 MG capsule Take 1 capsule (20 mg total) by mouth daily. Patient not taking: Reported on 04/16/2020 01/16/20   Evalee Jefferson, PA-C    Allergies    Patient has no known allergies.  Review of Systems   Review of Systems  Constitutional: Negative for appetite change, chills and fever.  Respiratory: Negative for shortness of breath.   Cardiovascular: Negative for chest pain.  Gastrointestinal: Positive for abdominal pain and diarrhea (foul smelling, dark to yellow stools associated with mucus,. ). Negative for abdominal distention, blood in stool, nausea and vomiting.  Genitourinary: Negative for decreased urine  volume, difficulty urinating, dysuria and flank pain.  Musculoskeletal: Negative for back pain.  Skin: Negative for color change and rash.  Neurological: Negative for dizziness, weakness and numbness.  Hematological: Negative for adenopathy.    Physical Exam Updated Vital Signs BP (!) 146/102 (BP Location: Right Arm)   Pulse 70   Temp 98.8 F (37.1 C) (Oral)   Resp 18   Ht 5\' 11"  (1.803 m)   Wt 65.8 kg   SpO2 100%   BMI 20.22 kg/m   Physical Exam Vitals and nursing note reviewed.  Constitutional:      General: He is not in acute distress.    Appearance: He is well-developed.  HENT:     Head: Normocephalic and atraumatic.  Cardiovascular:     Rate and Rhythm:  Normal rate and regular rhythm.     Heart sounds: Normal heart sounds. No murmur heard.   Pulmonary:     Effort: Pulmonary effort is normal. No respiratory distress.     Breath sounds: Normal breath sounds.  Abdominal:     General: Bowel sounds are normal. There is no distension.     Palpations: Abdomen is soft. There is no mass.     Tenderness: There is abdominal tenderness. There is no guarding or rebound.  Genitourinary:    Rectum: Guaiac result negative. No mass or tenderness. Normal anal tone.     Comments: Small amount of brown, heme-negative stool on digital rectal exam. No palpable rectal masses. Musculoskeletal:        General: Normal range of motion.  Skin:    General: Skin is warm.     Capillary Refill: Capillary refill takes less than 2 seconds.  Neurological:     Mental Status: He is alert and oriented to person, place, and time.     Sensory: No sensory deficit.     Motor: No weakness or abnormal muscle tone.     ED Results / Procedures / Treatments   Labs (all labs ordered are listed, but only abnormal results are displayed) Labs Reviewed  LIPASE, BLOOD - Abnormal; Notable for the following components:      Result Value   Lipase 69 (*)    All other components within normal limits  COMPREHENSIVE METABOLIC PANEL - Abnormal; Notable for the following components:   Glucose, Bld 103 (*)    All other components within normal limits  CBC  URINALYSIS, ROUTINE W REFLEX MICROSCOPIC  POC OCCULT BLOOD, ED    EKG None  Radiology CT ABDOMEN PELVIS W CONTRAST  Result Date: 04/16/2020 CLINICAL DATA:  Abdominal pain for 2 weeks EXAM: CT ABDOMEN AND PELVIS WITH CONTRAST TECHNIQUE: Multidetector CT imaging of the abdomen and pelvis was performed using the standard protocol following bolus administration of intravenous contrast. CONTRAST:  134mL OMNIPAQUE IOHEXOL 300 MG/ML  SOLN COMPARISON:  Ultrasound same day FINDINGS: Lower chest: The visualized heart size within normal  limits. No pericardial fluid/thickening. No hiatal hernia. The visualized portions of the lungs are clear. Hepatobiliary: The liver is normal in density without focal abnormality.The main portal vein is patent. No evidence of calcified gallstones, gallbladder wall thickening or biliary dilatation. Pancreas: Unremarkable. No pancreatic ductal dilatation or surrounding inflammatory changes. Spleen: Normal in size without focal abnormality. Adrenals/Urinary Tract: Both adrenal glands appear normal. The kidneys and collecting system appear normal without evidence of urinary tract calculus or hydronephrosis. Bladder is unremarkable. Stomach/Bowel: The stomach, small bowel, and proximal colon are normal in appearance. There appears to be mild wall thickening  seen around the sigmoid rectal junction, however no significant surrounding fat stranding changes are seen. The appendix is unremarkable. Vascular/Lymphatic: There are no enlarged mesenteric, retroperitoneal, or pelvic lymph nodes. No significant vascular findings are present. Reproductive: The prostate is unremarkable. Other: No evidence of abdominal wall mass or hernia. Musculoskeletal: No acute or significant osseous findings. IMPRESSION: Findings which could be suggestive of mild proctocolitis. Electronically Signed   By: Prudencio Pair M.D.   On: 04/16/2020 17:04   US Abdomen Limited  Result Date: 04/16/2020 CLINICAL DATA:  Epigastric pain for 2 weeks EXAM: ULTRASOUND ABDOMEN LIMITED RIGHT UPPER QUADRANT COMPARISON:  CT 01/16/2020 FINDINGS: Gallbladder: No gallstones or wall thickening visualized. Incidental note of a nonmobile 3 mm echogenic nonshadowing focus along the gallbladder lumen likely reflecting a small polyp. No sonographic Murphy sign noted by sonographer. Common bile duct: Diameter: 4 mm. Liver: No focal lesion identified. Within normal limits in parenchymal echogenicity. Portal vein is patent on color Doppler imaging with normal direction of  blood flow towards the liver. Other: None. IMPRESSION: 1. Incidental note of a 3 mm gallbladder polyp, benign. No further evaluation or follow-up of this finding is recommended. Reference: J Am Coll Radiol 2013;10:953-956. 2. Otherwise unremarkable ultrasound of the right upper quadrant. Electronically Signed   By: Davina Poke D.O.   On: 04/16/2020 15:45    Procedures Procedures (including critical care time)  Medications Ordered in ED Medications  sodium chloride 0.9 % bolus 1,000 mL (1,000 mLs Intravenous New Bag/Given 04/16/20 1856)  ondansetron (ZOFRAN) injection 4 mg (4 mg Intravenous Given 04/16/20 1856)  fentaNYL (SUBLIMAZE) injection 50 mcg (50 mcg Intravenous Given 04/16/20 1856)  iohexol (OMNIPAQUE) 300 MG/ML solution 100 mL (100 mLs Intravenous Contrast Given 04/16/20 1640)    ED Course  I have reviewed the triage vital signs and the nursing notes.  Pertinent labs & imaging results that were available during my care of the patient were reviewed by me and considered in my medical decision making (see chart for details).    MDM Rules/Calculators/A&P                           Patient here with upper mid abdominal pain, severe at times, reporting stools have been yellow in color and mixed with mucus and other times dark with appearance of coffee grounds.  No vomiting or fever reported no diarrhea.  Patient reports 10-15 pound weight loss in 2 weeks.  He is well-appearing. Nontoxic. Labs show no leukocytosis. Lipase mildly elevated at 69. Transaminases unremarkable. Will order gallbladder ultrasound.  Ultrasound negative for evidence of cholecystitis or cholelithiasis. We will proceed with CT scan. CT scan shows evidence of mild proctocolitis without evidence of acute changes of the pancreas, given that patient's pain is more epigastric I am doubtful this is of clinical concern. Doubt acute abdominal process. On recheck, patient reports feeling better. He has tolerated oral fluid  challenge. I feel he is appropriate for discharge home    1900 consulted Dr. Gala Romney who agrees to see pt in office for f/u.   Final Clinical Impression(s) / ED Diagnoses Final diagnoses:  Epigastric pain    Rx / DC Orders ED Discharge Orders    None       Bufford Lope 04/16/20 2010    Fredia Sorrow, MD 04/18/20 1643

## 2020-04-17 ENCOUNTER — Encounter: Payer: Self-pay | Admitting: Internal Medicine

## 2020-05-03 ENCOUNTER — Other Ambulatory Visit: Payer: Self-pay

## 2020-05-03 ENCOUNTER — Emergency Department (HOSPITAL_COMMUNITY)
Admission: EM | Admit: 2020-05-03 | Discharge: 2020-05-03 | Disposition: A | Discharge status: Home / Self Care | Payer: Self-pay | Attending: Emergency Medicine | Admitting: Emergency Medicine

## 2020-05-03 ENCOUNTER — Encounter (HOSPITAL_COMMUNITY): Payer: Self-pay | Admitting: Emergency Medicine

## 2020-05-03 DIAGNOSIS — Z79899 Other long term (current) drug therapy: Secondary | ICD-10-CM | POA: Insufficient documentation

## 2020-05-03 DIAGNOSIS — Z87891 Personal history of nicotine dependence: Secondary | ICD-10-CM | POA: Insufficient documentation

## 2020-05-03 DIAGNOSIS — I10 Essential (primary) hypertension: Secondary | ICD-10-CM | POA: Insufficient documentation

## 2020-05-03 LAB — CBC WITH DIFFERENTIAL/PLATELET
Abs Immature Granulocytes: 0.02 10*3/uL (ref 0.00–0.07)
Basophils Absolute: 0 10*3/uL (ref 0.0–0.1)
Basophils Relative: 0 %
Eosinophils Absolute: 0 10*3/uL (ref 0.0–0.5)
Eosinophils Relative: 0 %
HCT: 49.9 % (ref 39.0–52.0)
Hemoglobin: 16.3 g/dL (ref 13.0–17.0)
Immature Granulocytes: 0 %
Lymphocytes Relative: 16 %
Lymphs Abs: 1.2 10*3/uL (ref 0.7–4.0)
MCH: 31.5 pg (ref 26.0–34.0)
MCHC: 32.7 g/dL (ref 30.0–36.0)
MCV: 96.5 fL (ref 80.0–100.0)
Monocytes Absolute: 0.5 10*3/uL (ref 0.1–1.0)
Monocytes Relative: 6 %
Neutro Abs: 5.6 10*3/uL (ref 1.7–7.7)
Neutrophils Relative %: 78 %
Platelets: 269 10*3/uL (ref 150–400)
RBC: 5.17 MIL/uL (ref 4.22–5.81)
RDW: 12.7 % (ref 11.5–15.5)
WBC: 7.3 10*3/uL (ref 4.0–10.5)
nRBC: 0 % (ref 0.0–0.2)

## 2020-05-03 LAB — BASIC METABOLIC PANEL
Anion gap: 12 (ref 5–15)
BUN: 8 mg/dL (ref 6–20)
CO2: 26 mmol/L (ref 22–32)
Calcium: 9.5 mg/dL (ref 8.9–10.3)
Chloride: 99 mmol/L (ref 98–111)
Creatinine, Ser: 0.82 mg/dL (ref 0.61–1.24)
GFR calc Af Amer: >60 mL/min (ref >60)
GFR calc non Af Amer: >60 mL/min (ref >60)
Glucose, Bld: 111 mg/dL — ABNORMAL HIGH (ref 70–99)
Potassium: 4.2 mmol/L (ref 3.5–5.1)
Sodium: 137 mmol/L (ref 135–145)

## 2020-05-03 MED ORDER — ACETAMINOPHEN 325 MG PO TABS
650.0000 mg | ORAL_TABLET | Freq: Once | ORAL | Status: AC
  Administered 2020-05-03: 650 mg via ORAL
  Filled 2020-05-03: qty 2

## 2020-05-03 MED ORDER — HYDROCHLOROTHIAZIDE 12.5 MG PO CAPS: 12.5000 mg | ORAL_CAPSULE | Freq: Every day | ORAL | 0 refills | Status: DC

## 2020-05-03 NOTE — ED Triage Notes (Signed)
Patient was sent here by MD for high blood pressure

## 2020-05-03 NOTE — Discharge Instructions (Addendum)
As discussed, continue taking your regular lisinopril dose daily.  Add the prescription for hydrochlorothiazide daily.  You may notice that this medication makes you urinate more frequently which is normal.  You may also need to increase potassium in your diet.  Call your primary care provider on Monday to arrange a follow-up appointment to have your blood pressure and potassium levels rechecked.

## 2020-05-05 NOTE — ED Provider Notes (Signed)
[facility] EMERGENCY DEPARTMENT Provider Note   CSN: [id number] Arrival date & time: 05/03/20  5427     History No chief complaint on file.   Logan Benton is a 39 y.o. male.  HPI      Logan Benton is a 39 y.o. male who presents to the Emergency Department requesting evaluation of high blood pressure.  He states that he was recently started on lisinopril for his hypertension. Initially, he reports his BP was controlled, but for several days systolic pressure has been 160-180's.  He reports headache, gradual in onset and diffuse in location.  Headache has been waxing and waning for 2-3 days.  He contacted his PCP's office regarding his elevated blood pressure and advised to come here for evaluation.  He denies chest pain, shortness of breath, neck or jaw pain, visual changes, nausea, vomiting.   Past Medical History:  Diagnosis Date  . Abscessed tooth   . Anxiety   . C. difficile diarrhea   . Depression   . Hypertension   . Trigger finger     There are no problems to display for this patient.   Past Surgical History:  Procedure Laterality Date  . BILATERAL CARPAL TUNNEL RELEASE    . SHOULDER SURGERY Right    x2  . TRIGGER FINGER RELEASE Right 2016       Family History  Problem Relation Age of Onset  . Rheum arthritis Mother   . Kidney disease Father     Social History   Tobacco Use  . Smoking status: Former Smoker    Packs/day: 0.00    Years: 0.00    Pack years: 0.00    Quit date: 11/03/2019    Years since quitting: 0.5  . Smokeless tobacco: Never Used  Vaping Use  . Vaping Use: Former  . Quit date: 03/07/2019  Substance Use Topics  . Alcohol use: No  . Drug use: Not Currently    Home Medications Prior to Admission medications   Medication Sig Start Date End Date Taking? Authorizing Provider  baclofen (LIORESAL) 10 MG tablet Take 10 mg by mouth 2 (two) times daily.  04/10/20   [provider]  famotidine (PEPCID) 20 MG tablet Take 20  mg by mouth daily.    [provider]  FLUoxetine (PROZAC) 10 MG capsule Take 10 mg by mouth in the morning.  04/10/20   [provider]  FLUoxetine (PROZAC) 20 MG capsule Take 20 mg by mouth in the morning.  03/25/20   [provider]  hydrochlorothiazide (MICROZIDE) 12.5 MG capsule Take 1 capsule (12.5 mg total) by mouth daily. 05/03/20   Georgeann Brinkman, PA-C  HYDROcodone-acetaminophen (NORCO/VICODIN) 5-325 MG tablet Take one tab po q 4 hrs prn pain 04/16/20   Kenyatte Gruber, PA-C  hydrOXYzine (VISTARIL) 25 MG capsule Take 1 capsule (25 mg total) by mouth 3 (three) times daily as needed. Patient not taking: Reported on 01/16/2020 12/15/19   Soyla Dryer, PA-C  omeprazole (PRILOSEC) 20 MG capsule Take 1 capsule (20 mg total) by mouth daily. Patient not taking: Reported on 04/16/2020 01/16/20   Evalee Jefferson, PA-C    Allergies    Patient has no known allergies.  Review of Systems   Review of Systems  Constitutional: Negative for chills, fatigue and fever.  Eyes: Negative for visual disturbance.  Respiratory: Negative for chest tightness and shortness of breath.   Cardiovascular: Negative for chest pain and palpitations.  Gastrointestinal: Negative for abdominal pain, nausea and vomiting.  Genitourinary: Negative for dysuria and flank pain.  Musculoskeletal: Negative for arthralgias, back pain, myalgias, neck pain and neck stiffness.  Skin: Negative for rash.  Neurological: Positive for headaches. Negative for dizziness, syncope, speech difficulty, weakness and numbness.  Hematological: Does not bruise/bleed easily.    Physical Exam Updated Vital Signs BP (!) 143/103 (BP Location: Left Arm)   Pulse 66   Temp 98.4 F (36.9 C)   Resp 18   Ht 5\' 11"  (1.803 m)   Wt 65.8 kg   SpO2 100%   BMI 20.22 kg/m   Physical Exam Constitutional:      Appearance: Normal appearance. He is not ill-appearing or toxic-appearing.  HENT:     Head: Normocephalic.      Mouth/Throat:     Mouth: Mucous membranes are moist.  Eyes:     Extraocular Movements: Extraocular movements intact.     Pupils: Pupils are equal, round, and reactive to light.  Neck:     Thyroid: No thyromegaly.     Meningeal: Kernig's sign absent.  Cardiovascular:     Rate and Rhythm: Normal rate and regular rhythm.     Heart sounds: Normal heart sounds.  Pulmonary:     Effort: Pulmonary effort is normal. No respiratory distress.  Abdominal:     Palpations: Abdomen is soft.     Tenderness: There is no abdominal tenderness. There is no guarding or rebound.  Musculoskeletal:        General: Normal range of motion.     Cervical back: Normal range of motion and neck supple. No rigidity.     Right lower leg: No edema.     Left lower leg: No edema.  Skin:    General: Skin is warm.     Capillary Refill: Capillary refill takes less than 2 seconds.     Findings: No erythema or rash.  Neurological:     General: No focal deficit present.     Mental Status: He is alert.     Sensory: No sensory deficit.     Motor: No weakness.     ED Results / Procedures / Treatments   Labs (all labs ordered are listed, but only abnormal results are displayed) Labs Reviewed  BASIC METABOLIC PANEL - Abnormal; Notable for the following components:      Result Value   Glucose, Bld 111 (*)    All other components within normal limits  CBC WITH DIFFERENTIAL/PLATELET    EKG None  Radiology No results found.  Procedures Procedures (including critical care time)  Medications Ordered in ED Medications  acetaminophen (TYLENOL) tablet 650 mg (650 mg Oral Given 05/03/20 1310)    ED Course  I have reviewed the triage vital signs and the nursing notes.  Pertinent labs & imaging results that were available during my care of the patient were reviewed by me and considered in my medical decision making (see chart for details).    MDM Rules/Calculators/A&P                          Pt here with  diffuse headache of gradual onset, hypertensive currently taking lisinopril.  No meningeal signs.  No focal neuro deficits, pt well appearing.  Labs unremarkable.  Pt BP 143/103, took his lisinopril earlier today.  Will add a low dose HCTZ, he agrees to close f/u with PCP.    Final Clinical Impression(s) / ED Diagnoses Final diagnoses:  Hypertension, unspecified type    Rx /  DC Orders ED Discharge Orders         Ordered    hydrochlorothiazide (MICROZIDE) 12.5 MG capsule  Daily     Discontinue  Reprint     05/03/20 1432           Kem Parkinson, PA-C 05/05/20 1420    Long, Wonda Old[facility], MD 05/10/20 [phone]

## 2020-06-13 ENCOUNTER — Telehealth: Payer: Self-pay | Admitting: Gastroenterology

## 2020-06-13 ENCOUNTER — Other Ambulatory Visit: Payer: Self-pay

## 2020-06-13 ENCOUNTER — Ambulatory Visit: Payer: 59 | Admitting: Gastroenterology

## 2020-06-13 ENCOUNTER — Encounter: Payer: Self-pay | Admitting: Gastroenterology

## 2020-06-13 VITALS — BP 130/72 | HR 83 | Temp 97.3°F | Ht 71.0 in | Wt 150.8 lb

## 2020-06-13 DIAGNOSIS — K529 Noninfective gastroenteritis and colitis, unspecified: Secondary | ICD-10-CM | POA: Insufficient documentation

## 2020-06-13 DIAGNOSIS — R1013 Epigastric pain: Secondary | ICD-10-CM | POA: Diagnosis not present

## 2020-06-13 DIAGNOSIS — K824 Cholesterolosis of gallbladder: Secondary | ICD-10-CM | POA: Insufficient documentation

## 2020-06-13 NOTE — Progress Notes (Signed)
Cc'ed to pcp °

## 2020-06-13 NOTE — H&P (View-Only) (Signed)
Primary Care Physician:  Leonie Douglas, MD  Primary Gastroenterologist:  Elon Alas. Abbey Chatters, DO   Chief Complaint  Patient presents with  . Abdominal Pain    depending on what he eats, upper abd pain [id number] after eating certain foods    HPI:  Logan Benton is a 39 y.o. male here for further evaluation of abdominal pain.   Seen in ED in April and July for abdominal pain. CT A/P in 01/2020 with mild hyperemia and mucosal thickening within the stomach suggestive of gastritis. Abd u/s 04/2020 with 34mm gallbladder polyp. CT A/P 04/2020 with mild wall thickening around the sigmoid rectal junction.04/2020: lipase mildly elevated at 69. Other labs unremarkable, including CMET, CBC.   Patient states he was having issues with his stomach for months. Lost about 15 pounds. He felt like some of his symptoms was related to a new antidepressant.  He was on it for about 2 months and has subsequently stopped it.  He has been exercising and eating more healthy.  After his initial April visit in the ER he did take PPI for short period time.  He is concerned about potential side effects therefore does not want to take it on a regular basis.  Currently using Pepcid about 3 times per week.  His abdominal pain has improved somewhat.  As long as he stays strict to his diet and avoiding spicy, acidic foods, read meat, he has only minimal discomfort.  He does have postprandial epigastric pain and burning but not nearly as severe as before.  If he eats anything outside of the normal current diet, he has severe pain.  In July he was having loose mucousy stools, nonbloody.  CT showed probable proctocolitis.  He is now consuming high-fiber diet.  Bowel movements more formed.  Denies any blood in the stool or melena.  He has gained his weight back.  Patient states he was empirically treated with vancomycin for C. difficile back in early 2020.  He developed diarrhea after antibiotic use.  He has never completed stool studies,  EGD, colonoscopy. Grandmother IBS.   Current Outpatient Medications  Medication Sig Dispense Refill  . baclofen (LIORESAL) 10 MG tablet Take 10 mg by mouth 2 (two) times daily.     . famotidine (PEPCID) 20 MG tablet Take 20 mg by mouth daily. As needed     No current facility-administered medications for this visit.    Allergies as of 06/13/2020  . (No Known Allergies)    Past Medical History:  Diagnosis Date  . Abscessed tooth   . Anxiety   . C. difficile diarrhea    presumptive diagnosis  . Depression   . Hypertension   . Trigger finger     Past Surgical History:  Procedure Laterality Date  . BILATERAL CARPAL TUNNEL RELEASE    . SHOULDER SURGERY Right    x2  . TRIGGER FINGER RELEASE Right 2016    Family History  Problem Relation Age of Onset  . Rheum arthritis Mother   . Bladder Cancer Mother   . Kidney disease Father   . Bladder Cancer Maternal Grandfather   . Irritable bowel syndrome Paternal Grandmother   . Colon cancer Neg Hx   . Celiac disease Neg Hx     Social History   Socioeconomic History  . Marital status: Divorced    Spouse name: Not on file  . Number of children: Not on file  . Years of education: Not on file  . Highest education level:  Not on file  Occupational History  . Not on file  Tobacco Use  . Smoking status: Former Smoker    Packs/day: 0.00    Years: 0.00    Pack years: 0.00    Quit date: 11/03/2019    Years since quitting: 0.6  . Smokeless tobacco: Never Used  . Tobacco comment: nicoten gum  Vaping Use  . Vaping Use: Former  . Quit date: 03/07/2019  Substance and Sexual Activity  . Alcohol use: No  . Drug use: Not Currently  . Sexual activity: Not on file  Other Topics Concern  . Not on file  Social History Narrative  . Not on file   Social Determinants of Health   Financial Resource Strain:   . Difficulty of Paying Living Expenses: Not on file  Food Insecurity:   . Worried About Charity fundraiser in the Last Year:  Not on file  . Ran Out of Food in the Last Year: Not on file  Transportation Needs:   . Lack of Transportation (Medical): Not on file  . Lack of Transportation (Non-Medical): Not on file  Physical Activity:   . Days of Exercise per Week: Not on file  . Minutes of Exercise per Session: Not on file  Stress:   . Feeling of Stress : Not on file  Social Connections:   . Frequency of Communication with Friends and Family: Not on file  . Frequency of Social Gatherings with Friends and Family: Not on file  . Attends Religious Services: Not on file  . Active Member of Clubs or Organizations: Not on file  . Attends Archivist Meetings: Not on file  . Marital Status: Not on file  Intimate Partner Violence:   . Fear of Current or Ex-Partner: Not on file  . Emotionally Abused: Not on file  . Physically Abused: Not on file  . Sexually Abused: Not on file      ROS:  General: Negative for anorexia, weight loss, fever, chills, fatigue, weakness. Eyes: Negative for vision changes.  ENT: Negative for hoarseness, difficulty swallowing , nasal congestion. CV: Negative for chest pain, angina, palpitations, dyspnea on exertion, peripheral edema.  Respiratory: Negative for dyspnea at rest, dyspnea on exertion, cough, sputum, wheezing.  GI: See history of present illness. GU:  Negative for dysuria, hematuria, urinary incontinence, urinary frequency, nocturnal urination.  MS: Negative for joint pain, low back pain.  Derm: Negative for rash or itching.  Neuro: Negative for weakness, abnormal sensation, seizure, frequent headaches, memory loss, confusion.  Psych: Negative for anxiety, depression, suicidal ideation, hallucinations.  Endo: Negative for unusual weight change.  Heme: Negative for bruising or bleeding. Allergy: Negative for rash or hives.    Physical Examination:  BP (!) 143/82   Pulse 83   Temp (!) 97.3 F (36.3 C)   Ht 5\' 11"  (1.803 m)   Wt 150 lb 12.8 oz (68.4 kg)    BMI 21.03 kg/m    General: Well-nourished, well-developed in no acute distress.  Head: Normocephalic, atraumatic.   Eyes: Conjunctiva pink, no icterus. Mouth: masked Neck: Supple without thyromegaly, masses, or lymphadenopathy.  Lungs: Clear to auscultation bilaterally.  Heart: Regular rate and rhythm, no murmurs rubs or gallops.  Abdomen: Bowel sounds are normal, nontender, nondistended, no hepatosplenomegaly or masses, no abdominal bruits or    hernia , no rebound or guarding.   Rectal: not performed. Heme - in ed Extremities: No lower extremity edema. No clubbing or deformities.  Neuro: Alert and  oriented x 4 , grossly normal neurologically.  Skin: Warm and dry, no rash or jaundice.   Psych: Alert and cooperative, normal mood and affect.  Labs: Lab Results  Component Value Date   CREATININE 0.82 05/03/2020   BUN 8 05/03/2020   NA 137 05/03/2020   K 4.2 05/03/2020   CL 99 05/03/2020   CO2 26 05/03/2020   Lab Results  Component Value Date   ALT 33 04/16/2020   AST 22 04/16/2020   ALKPHOS 49 04/16/2020   BILITOT 0.8 04/16/2020   Lab Results  Component Value Date   WBC 7.3 05/03/2020   HGB 16.3 05/03/2020   HCT 49.9 05/03/2020   MCV 96.5 05/03/2020   PLT 269 05/03/2020   Lab Results  Component Value Date   LIPASE 69 (H) 04/16/2020     Imaging Studies: No results found.  Impression/plan:  Pleasant 39 year old gentleman presenting with postprandial upper abdominal pain occurring now for over 6 months.  Symptoms have improved with discontinuing antidepressant that he was on.  He also is avoiding spicy/acidic foods/red meat.  Continues to have mild to moderate postprandial epigastric burning/pain but not as severe as it is when he eats other foods.  Currently taking Pepcid 3 times weekly.  Wants to avoid chronic PPI therapy for fear of side effects.  Work-up showed gallbladder polyp, 3 mm in size.  Initial CT with concern for gastritis but not apparent on current CT.   Patient would benefit from upper endoscopy for further evaluation of symptoms.  Asked him to take Pepcid 10 to 20 mg twice daily for now.  Call with any acute severe symptoms.  If upper endoscopy negative, would consider HIDA to complete gallbladder work-up.  Will need to continue surveillance of gallbladder polyp with right upper quadrant ultrasound in 1 year.  CT with possible proctocolitis.  Patient was having loose stools at the time but this is since resolved.  Will discuss further with Dr. Abbey Chatters regarding need for endoscopic evaluation.  Mildly elevated lipase, somewhat nonspecific.  No CT evidence of pancreatitis.

## 2020-06-13 NOTE — Telephone Encounter (Signed)
Discussed with DR. Carver. Please let pt know that he is advising both EGD and colonoscopy at the same time due to abnormal stomach and abnormal rectosigmoid on CT, postprandial epigastric pain.   Please schedule both EGD/tCS with Abbey Chatters. ASA II.

## 2020-06-13 NOTE — Progress Notes (Signed)
Primary Care Physician:  Leonie Douglas, MD  Primary Gastroenterologist:  Elon Alas. Abbey Chatters, DO   Chief Complaint  Patient presents with  . Abdominal Pain    depending on what he eats, upper abd pain [id number] after eating certain foods    HPI:  Logan Benton is a 39 y.o. male here for further evaluation of abdominal pain.   Seen in ED in April and July for abdominal pain. CT A/P in 01/2020 with mild hyperemia and mucosal thickening within the stomach suggestive of gastritis. Abd u/s 04/2020 with 45mm gallbladder polyp. CT A/P 04/2020 with mild wall thickening around the sigmoid rectal junction.04/2020: lipase mildly elevated at 69. Other labs unremarkable, including CMET, CBC.   Patient states he was having issues with his stomach for months. Lost about 15 pounds. He felt like some of his symptoms was related to a new antidepressant.  He was on it for about 2 months and has subsequently stopped it.  He has been exercising and eating more healthy.  After his initial April visit in the ER he did take PPI for short period time.  He is concerned about potential side effects therefore does not want to take it on a regular basis.  Currently using Pepcid about 3 times per week.  His abdominal pain has improved somewhat.  As long as he stays strict to his diet and avoiding spicy, acidic foods, read meat, he has only minimal discomfort.  He does have postprandial epigastric pain and burning but not nearly as severe as before.  If he eats anything outside of the normal current diet, he has severe pain.  In July he was having loose mucousy stools, nonbloody.  CT showed probable proctocolitis.  He is now consuming high-fiber diet.  Bowel movements more formed.  Denies any blood in the stool or melena.  He has gained his weight back.  Patient states he was empirically treated with vancomycin for C. difficile back in early 2020.  He developed diarrhea after antibiotic use.  He has never completed stool studies,  EGD, colonoscopy. Grandmother IBS.   Current Outpatient Medications  Medication Sig Dispense Refill  . baclofen (LIORESAL) 10 MG tablet Take 10 mg by mouth 2 (two) times daily.     . famotidine (PEPCID) 20 MG tablet Take 20 mg by mouth daily. As needed     No current facility-administered medications for this visit.    Allergies as of 06/13/2020  . (No Known Allergies)    Past Medical History:  Diagnosis Date  . Abscessed tooth   . Anxiety   . C. difficile diarrhea    presumptive diagnosis  . Depression   . Hypertension   . Trigger finger     Past Surgical History:  Procedure Laterality Date  . BILATERAL CARPAL TUNNEL RELEASE    . SHOULDER SURGERY Right    x2  . TRIGGER FINGER RELEASE Right 2016    Family History  Problem Relation Age of Onset  . Rheum arthritis Mother   . Bladder Cancer Mother   . Kidney disease Father   . Bladder Cancer Maternal Grandfather   . Irritable bowel syndrome Paternal Grandmother   . Colon cancer Neg Hx   . Celiac disease Neg Hx     Social History   Socioeconomic History  . Marital status: Divorced    Spouse name: Not on file  . Number of children: Not on file  . Years of education: Not on file  . Highest education level:  Not on file  Occupational History  . Not on file  Tobacco Use  . Smoking status: Former Smoker    Packs/day: 0.00    Years: 0.00    Pack years: 0.00    Quit date: 11/03/2019    Years since quitting: 0.6  . Smokeless tobacco: Never Used  . Tobacco comment: nicoten gum  Vaping Use  . Vaping Use: Former  . Quit date: 03/07/2019  Substance and Sexual Activity  . Alcohol use: No  . Drug use: Not Currently  . Sexual activity: Not on file  Other Topics Concern  . Not on file  Social History Narrative  . Not on file   Social Determinants of Health   Financial Resource Strain:   . Difficulty of Paying Living Expenses: Not on file  Food Insecurity:   . Worried About Charity fundraiser in the Last Year:  Not on file  . Ran Out of Food in the Last Year: Not on file  Transportation Needs:   . Lack of Transportation (Medical): Not on file  . Lack of Transportation (Non-Medical): Not on file  Physical Activity:   . Days of Exercise per Week: Not on file  . Minutes of Exercise per Session: Not on file  Stress:   . Feeling of Stress : Not on file  Social Connections:   . Frequency of Communication with Friends and Family: Not on file  . Frequency of Social Gatherings with Friends and Family: Not on file  . Attends Religious Services: Not on file  . Active Member of Clubs or Organizations: Not on file  . Attends Archivist Meetings: Not on file  . Marital Status: Not on file  Intimate Partner Violence:   . Fear of Current or Ex-Partner: Not on file  . Emotionally Abused: Not on file  . Physically Abused: Not on file  . Sexually Abused: Not on file      ROS:  General: Negative for anorexia, weight loss, fever, chills, fatigue, weakness. Eyes: Negative for vision changes.  ENT: Negative for hoarseness, difficulty swallowing , nasal congestion. CV: Negative for chest pain, angina, palpitations, dyspnea on exertion, peripheral edema.  Respiratory: Negative for dyspnea at rest, dyspnea on exertion, cough, sputum, wheezing.  GI: See history of present illness. GU:  Negative for dysuria, hematuria, urinary incontinence, urinary frequency, nocturnal urination.  MS: Negative for joint pain, low back pain.  Derm: Negative for rash or itching.  Neuro: Negative for weakness, abnormal sensation, seizure, frequent headaches, memory loss, confusion.  Psych: Negative for anxiety, depression, suicidal ideation, hallucinations.  Endo: Negative for unusual weight change.  Heme: Negative for bruising or bleeding. Allergy: Negative for rash or hives.    Physical Examination:  BP (!) 143/82   Pulse 83   Temp (!) 97.3 F (36.3 C)   Ht 5\' 11"  (1.803 m)   Wt 150 lb 12.8 oz (68.4 kg)    BMI 21.03 kg/m    General: Well-nourished, well-developed in no acute distress.  Head: Normocephalic, atraumatic.   Eyes: Conjunctiva pink, no icterus. Mouth: masked Neck: Supple without thyromegaly, masses, or lymphadenopathy.  Lungs: Clear to auscultation bilaterally.  Heart: Regular rate and rhythm, no murmurs rubs or gallops.  Abdomen: Bowel sounds are normal, nontender, nondistended, no hepatosplenomegaly or masses, no abdominal bruits or    hernia , no rebound or guarding.   Rectal: not performed. Heme - in ed Extremities: No lower extremity edema. No clubbing or deformities.  Neuro: Alert and  oriented x 4 , grossly normal neurologically.  Skin: Warm and dry, no rash or jaundice.   Psych: Alert and cooperative, normal mood and affect.  Labs: Lab Results  Component Value Date   CREATININE 0.82 05/03/2020   BUN 8 05/03/2020   NA 137 05/03/2020   K 4.2 05/03/2020   CL 99 05/03/2020   CO2 26 05/03/2020   Lab Results  Component Value Date   ALT 33 04/16/2020   AST 22 04/16/2020   ALKPHOS 49 04/16/2020   BILITOT 0.8 04/16/2020   Lab Results  Component Value Date   WBC 7.3 05/03/2020   HGB 16.3 05/03/2020   HCT 49.9 05/03/2020   MCV 96.5 05/03/2020   PLT 269 05/03/2020   Lab Results  Component Value Date   LIPASE 69 (H) 04/16/2020     Imaging Studies: No results found.  Impression/plan:  Pleasant 39 year old gentleman presenting with postprandial upper abdominal pain occurring now for over 6 months.  Symptoms have improved with discontinuing antidepressant that he was on.  He also is avoiding spicy/acidic foods/red meat.  Continues to have mild to moderate postprandial epigastric burning/pain but not as severe as it is when he eats other foods.  Currently taking Pepcid 3 times weekly.  Wants to avoid chronic PPI therapy for fear of side effects.  Work-up showed gallbladder polyp, 3 mm in size.  Initial CT with concern for gastritis but not apparent on current CT.   Patient would benefit from upper endoscopy for further evaluation of symptoms.  Asked him to take Pepcid 10 to 20 mg twice daily for now.  Call with any acute severe symptoms.  If upper endoscopy negative, would consider HIDA to complete gallbladder work-up.  Will need to continue surveillance of gallbladder polyp with right upper quadrant ultrasound in 1 year.  CT with possible proctocolitis.  Patient was having loose stools at the time but this is since resolved.  Will discuss further with Dr. Abbey Chatters regarding need for endoscopic evaluation.  Mildly elevated lipase, somewhat nonspecific.  No CT evidence of pancreatitis.

## 2020-06-13 NOTE — Patient Instructions (Signed)
1. We will call you to schedule your procedure. I would like to discuss with Dr. Abbey Chatters regarding possible need for colonoscopy before we schedule you for an upper endoscopy. If you need both, then we could do both at the same time.  2. For now take Pepcid 10-20mg  twice daily.  3. Avoid trigger foods.  4. You may have gastritis or an ulcer. Please check out the diet recommendations below.    Peptic Ulcer Eating Plan Peptic ulcers are sores that form on the lining of the stomach, esophagus, or the part of the small intestine that is attached to the stomach (duodenum). These sores are also called stomach ulcers. When ulcers develop, they can cause a burning feeling in the stomach as well as bloating, nausea, vomiting, and poor appetite. If you have a history of peptic ulcers, it is important to keep track of what foods and drinks cause symptoms. What are tips for following this plan?   Eat a healthy, well-balanced diet. This includes: ? Fresh fruits and vegetables. Eat a variety of colors of fruits and vegetables. ? Whole grains. Try to make sure at least half of the grains you eat each day are whole grains. ? Low-fat dairy. ? Lean meat, fish, poultry, eggs, beans, and nuts. ? Healthy fats, such as olive oil, grapeseed oil, or canola oil. Try to eat less than 8 teaspoons of fats and oils each day.  Avoid foods that cause irritation or pain. These may be different for different people. Keep a food diary to identify foods that cause symptoms.  Avoid processed foods that have added salt and sugar.  Avoid drinking alcohol.  Avoid drinks with caffeine, such as cola, black tea, energy drinks, and coffee. Recommended foods Grains  Whole grains. Vegetables  All fresh or frozen vegetables. Low-sodium canned vegetables. Fruits  All fresh, frozen, or dried fruit. Fruit canned in juice. Meats and other protein foods  Lean cuts of meat. Skinless poultry. Fresh or canned fish. Eggs. Tofu. Nuts  and nut butter. Dried beans. Low-sodium canned beans. Dairy  Low-fat or nonfat (skim) milk. Nonfat or low-fat yogurt. Nonfat or low-fat cheese. Beverages  Water. Soy or nut milks. Caffeine-free soft drinks. Herbal tea. Fats and oils  Olive oil. Canola oil. Grapeseed oil. Sunflower oil. Seasoning and other foods  Low-fat salad dressing. Ketchup. Low-fat mayonnaise. All spices except pepper. Low-sodium seasoning mixes. Foods to avoid Meats and other protein foods  Fatty meats. Fried meats. Any meat that causes symptoms. Dairy  Whole milk. Ice cream. Cream. Chocolate milk. Beverages  Alcohol. Coffee. Cola and energy drinks. Black or green tea. Cocoa. Fats and oils  Butter. Lard. Ghee. Seasoning and other foods  Pepper. Hot sauce. Any seasonings or condiments that cause symptoms. Summary  Peptic ulcers can cause burning in the stomach as well as bloating, nausea, vomiting, and poor appetite. You may be able to limit symptoms by avoiding foods that make you feel worse.  Work with your dietitian or health care provider to identify foods that cause symptoms. This may include caffeinated drinks, alcohol, or pepper. This information is not intended to replace advice given to you by your health care provider. Make sure you discuss any questions you have with your health care provider. Document Revised: 09/04/2017 Document Reviewed: 11/03/2016 Elsevier Patient Education  2020 [employer].

## 2020-06-14 NOTE — Telephone Encounter (Signed)
Spoke with patient and he is scheduled for TCS/EGD 9/24 at 1:00pm. Aware will need covid test prior. Has been scheduled for 9/23 at 8:00am. Aware of location. Also aware will mail prep instructions and will send to his mychart as well.

## 2020-06-18 ENCOUNTER — Ambulatory Visit: Payer: Worker's Compensation | Admitting: Physician Assistant

## 2020-06-28 ENCOUNTER — Other Ambulatory Visit (HOSPITAL_COMMUNITY)
Admission: RE | Admit: 2020-06-28 | Discharge: 2020-06-28 | Disposition: A | Discharge status: Home / Self Care | Payer: 59 | Source: Ambulatory Visit | Attending: Internal Medicine | Admitting: Internal Medicine

## 2020-06-28 ENCOUNTER — Other Ambulatory Visit: Payer: Self-pay

## 2020-06-28 DIAGNOSIS — Z01812 Encounter for preprocedural laboratory examination: Secondary | ICD-10-CM | POA: Diagnosis not present

## 2020-06-28 DIAGNOSIS — Z20822 Contact with and (suspected) exposure to covid-19: Secondary | ICD-10-CM | POA: Diagnosis present

## 2020-06-28 LAB — SARS CORONAVIRUS 2 (TAT 6-24 HRS): SARS Coronavirus 2: NEGATIVE

## 2020-06-29 ENCOUNTER — Ambulatory Visit (HOSPITAL_COMMUNITY)
Admission: RE | Admit: 2020-06-29 | Discharge: 2020-06-29 | Disposition: A | Discharge status: Home / Self Care | Payer: 59 | Attending: Internal Medicine | Admitting: Internal Medicine

## 2020-06-29 ENCOUNTER — Ambulatory Visit (HOSPITAL_COMMUNITY): Payer: 59 | Admitting: Anesthesiology

## 2020-06-29 ENCOUNTER — Other Ambulatory Visit: Payer: Self-pay

## 2020-06-29 ENCOUNTER — Encounter (HOSPITAL_COMMUNITY)
Admission: RE | Disposition: A | Discharge status: Home / Self Care | Payer: Self-pay | Source: Home / Self Care | Attending: Internal Medicine

## 2020-06-29 ENCOUNTER — Encounter (HOSPITAL_COMMUNITY): Payer: Self-pay

## 2020-06-29 DIAGNOSIS — K209 Esophagitis, unspecified without bleeding: Secondary | ICD-10-CM | POA: Diagnosis not present

## 2020-06-29 DIAGNOSIS — Z87891 Personal history of nicotine dependence: Secondary | ICD-10-CM | POA: Insufficient documentation

## 2020-06-29 DIAGNOSIS — K30 Functional dyspepsia: Secondary | ICD-10-CM | POA: Diagnosis not present

## 2020-06-29 DIAGNOSIS — K2970 Gastritis, unspecified, without bleeding: Secondary | ICD-10-CM | POA: Diagnosis not present

## 2020-06-29 DIAGNOSIS — K2950 Unspecified chronic gastritis without bleeding: Secondary | ICD-10-CM | POA: Diagnosis not present

## 2020-06-29 DIAGNOSIS — Z8052 Family history of malignant neoplasm of bladder: Secondary | ICD-10-CM | POA: Insufficient documentation

## 2020-06-29 DIAGNOSIS — Z79899 Other long term (current) drug therapy: Secondary | ICD-10-CM | POA: Diagnosis not present

## 2020-06-29 DIAGNOSIS — I10 Essential (primary) hypertension: Secondary | ICD-10-CM | POA: Diagnosis not present

## 2020-06-29 DIAGNOSIS — F419 Anxiety disorder, unspecified: Secondary | ICD-10-CM | POA: Insufficient documentation

## 2020-06-29 DIAGNOSIS — Z8261 Family history of arthritis: Secondary | ICD-10-CM | POA: Diagnosis not present

## 2020-06-29 DIAGNOSIS — D125 Benign neoplasm of sigmoid colon: Secondary | ICD-10-CM | POA: Diagnosis not present

## 2020-06-29 DIAGNOSIS — Z8379 Family history of other diseases of the digestive system: Secondary | ICD-10-CM | POA: Diagnosis not present

## 2020-06-29 DIAGNOSIS — R933 Abnormal findings on diagnostic imaging of other parts of digestive tract: Secondary | ICD-10-CM | POA: Diagnosis present

## 2020-06-29 DIAGNOSIS — F329 Major depressive disorder, single episode, unspecified: Secondary | ICD-10-CM | POA: Insufficient documentation

## 2020-06-29 DIAGNOSIS — Z841 Family history of disorders of kidney and ureter: Secondary | ICD-10-CM | POA: Insufficient documentation

## 2020-06-29 DIAGNOSIS — K648 Other hemorrhoids: Secondary | ICD-10-CM | POA: Insufficient documentation

## 2020-06-29 DIAGNOSIS — K635 Polyp of colon: Secondary | ICD-10-CM | POA: Diagnosis not present

## 2020-06-29 HISTORY — PX: COLONOSCOPY WITH PROPOFOL: SHX5780

## 2020-06-29 HISTORY — PX: POLYPECTOMY: SHX5525

## 2020-06-29 HISTORY — PX: BIOPSY: SHX5522

## 2020-06-29 HISTORY — PX: ESOPHAGOGASTRODUODENOSCOPY (EGD) WITH PROPOFOL: SHX5813

## 2020-06-29 SURGERY — COLONOSCOPY WITH PROPOFOL
Anesthesia: General

## 2020-06-29 MED ORDER — MIDAZOLAM HCL 2 MG/2ML IJ SOLN
INTRAMUSCULAR | Status: AC
  Filled 2020-06-29: qty 2

## 2020-06-29 MED ORDER — KETAMINE HCL 50 MG/5ML IJ SOSY
PREFILLED_SYRINGE | INTRAMUSCULAR | Status: AC
  Filled 2020-06-29: qty 5

## 2020-06-29 MED ORDER — LIDOCAINE VISCOUS HCL 2 % MT SOLN
OROMUCOSAL | Status: AC
  Filled 2020-06-29: qty 15

## 2020-06-29 MED ORDER — GLYCOPYRROLATE 0.2 MG/ML IJ SOLN
0.2000 mg | Freq: Once | INTRAMUSCULAR | Status: AC
  Administered 2020-06-29: 0.2 mg via INTRAVENOUS

## 2020-06-29 MED ORDER — SUCRALFATE 1 G PO TABS: 1.0000 g | ORAL_TABLET | Freq: Four times a day (QID) | ORAL | 1 refills | Status: DC

## 2020-06-29 MED ORDER — LACTATED RINGERS IV SOLN: INTRAVENOUS | Status: DC | PRN

## 2020-06-29 MED ORDER — PROPOFOL 500 MG/50ML IV EMUL
INTRAVENOUS | Status: DC | PRN
  Administered 2020-06-29: 150 ug/kg/min via INTRAVENOUS

## 2020-06-29 MED ORDER — LIDOCAINE 2% (20 MG/ML) 5 ML SYRINGE
INTRAMUSCULAR | Status: DC | PRN
  Administered 2020-06-29: 60 mg via INTRAVENOUS

## 2020-06-29 MED ORDER — STERILE WATER FOR IRRIGATION IR SOLN
Status: DC | PRN
  Administered 2020-06-29: 100 mL

## 2020-06-29 MED ORDER — KETAMINE HCL 10 MG/ML IJ SOLN
INTRAMUSCULAR | Status: DC | PRN
  Administered 2020-06-29: 20 mg via INTRAVENOUS

## 2020-06-29 MED ORDER — CHLORHEXIDINE GLUCONATE CLOTH 2 % EX PADS: 6.0000 | MEDICATED_PAD | Freq: Once | CUTANEOUS | Status: DC

## 2020-06-29 MED ORDER — OMEPRAZOLE 40 MG PO CPDR: 40.0000 mg | DELAYED_RELEASE_CAPSULE | Freq: Two times a day (BID) | ORAL | 5 refills | Status: DC

## 2020-06-29 MED ORDER — GLYCOPYRROLATE 0.2 MG/ML IJ SOLN
INTRAMUSCULAR | Status: AC
  Filled 2020-06-29: qty 1

## 2020-06-29 MED ORDER — PROPOFOL 10 MG/ML IV BOLUS
INTRAVENOUS | Status: DC | PRN
  Administered 2020-06-29: 10 mg via INTRAVENOUS
  Administered 2020-06-29: 20 mg via INTRAVENOUS
  Administered 2020-06-29: 10 mg via INTRAVENOUS
  Administered 2020-06-29: 20 mg via INTRAVENOUS
  Administered 2020-06-29: 10 mg via INTRAVENOUS
  Administered 2020-06-29: 20 mg via INTRAVENOUS

## 2020-06-29 MED ORDER — MIDAZOLAM HCL 5 MG/5ML IJ SOLN
INTRAMUSCULAR | Status: DC | PRN
  Administered 2020-06-29: 2 mg via INTRAVENOUS

## 2020-06-29 MED ORDER — LACTATED RINGERS IV SOLN
Freq: Once | INTRAVENOUS | Status: AC
  Administered 2020-06-29: 1000 mL via INTRAVENOUS

## 2020-06-29 MED ORDER — LIDOCAINE VISCOUS HCL 2 % MT SOLN
15.0000 mL | Freq: Once | OROMUCOSAL | Status: AC
  Administered 2020-06-29: 15 mL via OROMUCOSAL

## 2020-06-29 NOTE — Anesthesia Postprocedure Evaluation (Signed)
Anesthesia Post Note  Patient: Logan Benton  Procedure(s) Performed: COLONOSCOPY WITH PROPOFOL (N/A ) ESOPHAGOGASTRODUODENOSCOPY (EGD) WITH PROPOFOL (N/A ) BIOPSY POLYPECTOMY  Patient location during evaluation: Endoscopy Anesthesia Type: MAC Level of consciousness: awake and awake and alert Pain management: pain level controlled Vital Signs Assessment: post-procedure vital signs reviewed and stable Respiratory status: spontaneous breathing Cardiovascular status: blood pressure returned to baseline Anesthetic complications: no   No complications documented.   Last Vitals:  Vitals:   06/29/20 1151 06/29/20 1246  BP: 109/63 (!) 92/57  Pulse: (!) 56 (!) 57  Resp: 12 14  Temp: 36.7 C 36.5 C  SpO2: 100% 100%    Last Pain:  Vitals:   06/29/20 1246  TempSrc: Oral  PainSc: 0-No pain                 Kyngston Pickelsimer

## 2020-06-29 NOTE — Transfer of Care (Signed)
Immediate Anesthesia Transfer of Care Note  Patient: Anh L Waddington  Procedure(s) Performed: COLONOSCOPY WITH PROPOFOL (N/A ) ESOPHAGOGASTRODUODENOSCOPY (EGD) WITH PROPOFOL (N/A ) BIOPSY POLYPECTOMY  Patient Location: Endoscopy Unit  Anesthesia Type:MAC  Level of Consciousness: awake  Airway & Oxygen Therapy: Patient Spontanous Breathing and Patient connected to nasal cannula oxygen  Post-op Assessment: Report given to RN and Post -op Vital signs reviewed and stable  Post vital signs: Reviewed and stable  Last Vitals:  Vitals Value Taken Time  BP    Temp    Pulse    Resp    SpO2      Last Pain:  Vitals:   06/29/20 1209  TempSrc:   PainSc: 0-No pain      Patients Stated Pain Goal: 7 (70/14/10 3013)  Complications: No complications documented.

## 2020-06-29 NOTE — Discharge Instructions (Addendum)
EGD Discharge instructions Please read the instructions outlined below and refer to this sheet in the next few weeks. These discharge instructions provide you with general information on caring for yourself after you leave the hospital. Your doctor may also give you specific instructions. While your treatment has been planned according to the most current medical practices available, unavoidable complications occasionally occur. If you have any problems or questions after discharge, please call your doctor.  Dr. Abbey Chatters:  [phone] ACTIVITY  You may resume your regular activity but move at a slower pace for the next 24 hours.   Take frequent rest periods for the next 24 hours.   Walking will help expel (get rid of) the air and reduce the bloated feeling in your abdomen.   No driving for 24 hours (because of the anesthesia (medicine) used during the test).   You may shower.   Do not sign any important legal documents or operate any machinery for 24 hours (because of the anesthesia used during the test).  NUTRITION  Drink plenty of fluids.   You may resume your normal diet.   Begin with a light meal and progress to your normal diet.   Avoid alcoholic beverages for 24 hours or as instructed by your caregiver.  MEDICATIONS  You may resume your normal medications. WHAT YOU CAN EXPECT TODAY  You may experience abdominal discomfort such as a feeling of fullness or "gas" pains.  FOLLOW-UP  Your doctor will discuss the results of your test with you.  SEEK IMMEDIATE MEDICAL ATTENTION IF ANY OF THE FOLLOWING OCCUR:  Excessive nausea (feeling sick to your stomach) and/or vomiting.   Severe abdominal pain and distention (swelling).   Trouble swallowing.   Temperature over 101 F (37.8 C).   Rectal bleeding or vomiting of blood.    Colonoscopy Discharge Instructions  Read the instructions outlined below and refer to this sheet in the next few weeks. These discharge instructions  provide you with general information on caring for yourself after you leave the hospital. Your doctor may also give you specific instructions. While your treatment has been planned according to the most current medical practices available, unavoidable complications occasionally occur.   ACTIVITY  You may resume your regular activity, but move at a slower pace for the next 24 hours.   Take frequent rest periods for the next 24 hours.   Walking will help get rid of the air and reduce the bloated feeling in your belly (abdomen).   No driving for 24 hours (because of the medicine (anesthesia) used during the test).    Do not sign any important legal documents or operate any machinery for 24 hours (because of the anesthesia used during the test).  NUTRITION  Drink plenty of fluids.   You may resume your normal diet.   Avoid alcoholic beverages for 24 hours or as instructed.  MEDICATIONS  You may resume your normal medications WHAT YOU CAN EXPECT TODAY  Some feelings of bloating in the abdomen.   Passage of more gas than usual.   Spotting of blood in your stool or on the toilet paper.  FINDING OUT THE RESULTS OF YOUR TEST Not all test results are available during your visit. If your test results are not back during the visit, make an appointment with your caregiver to find out the results. Do not assume everything is normal if you have not heard from your caregiver or the medical facility. It is important for you to follow up on  all of your test results.  SEEK IMMEDIATE MEDICAL ATTENTION IF:  You have more than a spotting of blood in your stool.   Your belly is swollen (abdominal distention).   You are nauseated or vomiting.   You have a temperature over 101.   You have abdominal pain or discomfort that is severe or gets worse throughout the day.   Your EGD showed moderately severe esophagitis with ulceration.  You also had a lot of inflammation in your stomach.  I took biopsies  to rule out infection with a bacteria called H. pylori.  I recommend starting omeprazole 40 mg twice daily for the next 8 weeks.  I have sent a prescription to your pharmacy.  We will need to repeat EGD in 8 weeks to evaluate for healing as well as biopsy your esophagus for Barrett's esophagus once the inflammation has subsided.  Your colonoscopy was relatively unremarkable.  I removed one small polyp.  We should have pathology results next week.  My office will contact you.  I recommend repeating colonoscopy in 7 to 10 years depending on these results.  I did not find any evidence of residual inflammation throughout your entire colon.  I hope you have a great rest of your week!  Logan Benton. Abbey Chatters, D.O. Gastroenterology and Hepatology [facility]

## 2020-06-29 NOTE — Op Note (Signed)
[facility] Patient Name: Logan Benton Procedure Date: 06/29/2020 12:26 PM MRN: [id number] Date of Birth: May 11, 1981 Attending MD: Elon Alas. Abbey Chatters DO CSN: [id number] Age: 39 Admit Type: Outpatient Procedure:                Colonoscopy Indications:              Abnormal CT of the GI tract Providers:                Elon Alas. Abbey Chatters, DO Referring MD:              Medicines:                See the Anesthesia note for documentation of the                            administered medications Complications:            No immediate complications. Estimated Blood Loss:     Estimated blood loss was minimal. Procedure:                Pre-Anesthesia Assessment:                           - The anesthesia plan was to use monitored                            anesthesia care (MAC).                           After obtaining informed consent, the colonoscope                            was passed under direct vision. Throughout the                            procedure, the patient's blood pressure, pulse, and                            oxygen saturations were monitored continuously. The                            [id number]-H190DL ([id number]) scope was introduced through                            the anus and advanced to the the cecum, identified                            by appendiceal orifice and ileocecal valve. The                            colonoscopy was performed without difficulty. The                            patient tolerated the procedure well. The quality                            of the bowel preparation  was evaluated using the                            BBPS [facility] Bowel Preparation Scale) with scores                            of: Right Colon = 2 (minor amount of residual                            staining, small fragments of stool and/or opaque                            liquid, but mucosa seen well), Transverse Colon = 3                            (entire mucosa seen well  with no residual staining,                            small fragments of stool or opaque liquid) and Left                            Colon = 3 (entire mucosa seen well with no residual                            staining, small fragments of stool or opaque                            liquid). The total BBPS score equals 8. The quality                            of the bowel preparation was good. Scope In: 12:28:30 PM Scope Out: 12:39:24 PM Scope Withdrawal Time: 0 hours 8 minutes 49 seconds  Total Procedure Duration: 0 hours 10 minutes 54 seconds  Findings:      The perianal and digital rectal examinations were normal.      A 4 mm polyp was found in the sigmoid colon. The polyp was sessile. The       polyp was removed with a cold snare. Resection and retrieval were       complete.      The exam was otherwise without abnormality.      Non-bleeding internal hemorrhoids were found during endoscopy. Impression:               - One 4 mm polyp in the sigmoid colon, removed with                            a cold snare. Resected and retrieved.                           - The examination was otherwise normal.                           - Non-bleeding internal hemorrhoids. Moderate Sedation:      Per Anesthesia Care Recommendation:           -  Patient has a contact number available for                            emergencies. The signs and symptoms of potential                            delayed complications were discussed with the                            patient. Return to normal activities tomorrow.                            Written discharge instructions were provided to the                            patient.                           - Resume previous diet.                           - Continue present medications.                           - Await pathology results.                           - Repeat colonoscopy in 7-10 years for surveillance                            based on  pathology results. Procedure Code(s):        --- Professional ---                           [phone], Colonoscopy, flexible; with removal of                            tumor(s), polyp(s), or other lesion(s) by snare                            technique Diagnosis Code(s):        --- Professional ---                           K63.5, Polyp of colon                           R93.3, Abnormal findings on diagnostic imaging of                            other parts of digestive tract CPT copyright 2019 American Medical Association. All rights reserved. The codes documented in this report are preliminary and upon coder review may  be revised to meet current compliance requirements. Elon Alas. Abbey Chatters, DO [place] Abbey Chatters, DO 06/29/2020 12:42:09 PM This report has been signed electronically. Number of Addenda: 0

## 2020-06-29 NOTE — Interval H&P Note (Signed)
History and Physical Interval Note:  06/29/2020 11:41 AM  Logan Benton  has presented today for surgery, with the diagnosis of abnormal stomach and abnormal rectosigmoid on CT, postprandial epigastric pain.  The various methods of treatment have been discussed with the patient and family. After consideration of risks, benefits and other options for treatment, the patient has consented to  Procedure(s) with comments: COLONOSCOPY WITH PROPOFOL (N/A) - 1:00pm ESOPHAGOGASTRODUODENOSCOPY (EGD) WITH PROPOFOL (N/A) as a surgical intervention.  The patient's history has been reviewed, patient examined, no change in status, stable for surgery.  I have reviewed the patient's chart and labs.  Questions were answered to the patient's satisfaction.     Eloise Harman

## 2020-06-29 NOTE — Op Note (Signed)
[facility] Patient Name: Logan Benton Procedure Date: 06/29/2020 11:28 AM MRN: [id number] Date of Birth: [birth year] Attending MD: Elon Alas. Edgar Frisk CSN: [id number] Age: 39 Admit Type: Outpatient Procedure:                Upper GI endoscopy Indications:              Epigastric abdominal pain, Functional Dyspepsia Providers:                Elon Alas. Abbey Chatters, DO, Hickman Page, [place]                            Theda Sers RN, RN Referring MD:              Medicines:                See the Anesthesia note for documentation of the                            administered medications Complications:            No immediate complications. Estimated Blood Loss:     Estimated blood loss was minimal. Procedure:                Pre-Anesthesia Assessment:                           - The anesthesia plan was to use monitored                            anesthesia care (MAC).                           After obtaining informed consent, the endoscope was                            passed under direct vision. Throughout the                            procedure, the patient's blood pressure, pulse, and                            oxygen saturations were monitored continuously. The                            GIF-H190 ([id number]) scope was introduced through the                            mouth, and advanced to the second part of duodenum.                            The upper GI endoscopy was accomplished without                            difficulty. The patient tolerated the procedure                            well. Scope  In: 12:20:31 PM Scope Out: 12:23:06 PM Total Procedure Duration: 0 hours 2 minutes 35 seconds  Findings:      LA Grade C (one or more mucosal breaks continuous between tops of 2 or       more mucosal folds, less than 75% circumference) esophagitis with no       bleeding was found in the lower third of the esophagus.      The Z-line was irregular and was found 40 cm from the  incisors.      Diffuse moderate inflammation characterized by erosions and erythema was       found in the entire examined stomach. Biopsies were taken with a cold       forceps for Helicobacter pylori testing.      The duodenal bulb, first portion of the duodenum and second portion of       the duodenum were normal. Biopsies for histology were taken with a cold       forceps for evaluation of celiac disease. Impression:               - LA Grade C esophagitis with no bleeding.                           - Z-line irregular, 40 cm from the incisors.                           - Gastritis. Biopsied.                           - Normal duodenal bulb, first portion of the                            duodenum and second portion of the duodenum.                            Biopsied. Moderate Sedation:      Per Anesthesia Care Recommendation:           - Patient has a contact number available for                            emergencies. The signs and symptoms of potential                            delayed complications were discussed with the                            patient. Return to normal activities tomorrow.                            Written discharge instructions were provided to the                            patient.                           - Resume previous diet.                           -  Continue present medications.                           - Await pathology results.                           - Repeat upper endoscopy in 8 weeks to evaluate the                            response to therapy.                           - Use a proton pump inhibitor PO BID for 8 weeks. Procedure Code(s):        --- Professional ---                           [id number], Esophagogastroduodenoscopy, flexible,                            transoral; with biopsy, single or multiple Diagnosis Code(s):        --- Professional ---                           K20.90, Esophagitis, unspecified without bleeding                            K22.8, Other specified diseases of esophagus                           K29.70, Gastritis, unspecified, without bleeding                           R10.13, Epigastric pain                           K30, Functional dyspepsia CPT copyright 2019 American Medical Association. All rights reserved. The codes documented in this report are preliminary and upon coder review may  be revised to meet current compliance requirements. Elon Alas. Abbey Chatters, DO [place] Abbey Chatters, DO 06/29/2020 12:27:11 PM This report has been signed electronically. Number of Addenda: 0

## 2020-06-29 NOTE — Anesthesia Preprocedure Evaluation (Addendum)
Anesthesia Evaluation  Patient identified by MRN, date of birth, ID band Patient awake    Reviewed: Allergy & Precautions, NPO status , Patient's Chart, lab work & pertinent test results  History of Anesthesia Complications Negative for: history of anesthetic complications  Airway Mallampati: I  TM Distance: >3 FB Neck ROM: Full    Dental  (+) Teeth Intact, Dental Advisory Given   Pulmonary former smoker,    Pulmonary exam normal breath sounds clear to auscultation       Cardiovascular Exercise Tolerance: Good hypertension, Pt. on medications Normal cardiovascular exam Rhythm:Regular Rate:Normal     Neuro/Psych PSYCHIATRIC DISORDERS Anxiety Depression    GI/Hepatic Neg liver ROS, Epigastric pain   Endo/Other  negative endocrine ROS  Renal/GU negative Renal ROS  negative genitourinary   Musculoskeletal negative musculoskeletal ROS (+)   Abdominal   Peds negative pediatric ROS (+)  Hematology negative hematology ROS (+)   Anesthesia Other Findings   Reproductive/Obstetrics negative OB ROS                            Anesthesia Physical Anesthesia Plan  ASA: II  Anesthesia Plan: General   Post-op Pain Management:    Induction: Intravenous  PONV Risk Score and Plan: TIVA  Airway Management Planned: Nasal Cannula and Natural Airway  Additional Equipment:   Intra-op Plan:   Post-operative Plan:   Informed Consent: I have reviewed the patients History and Physical, chart, labs and discussed the procedure including the risks, benefits and alternatives for the proposed anesthesia with the patient or authorized representative who has indicated his/her understanding and acceptance.     Dental advisory given  Plan Discussed with: CRNA and Surgeon  Anesthesia Plan Comments:         Anesthesia Quick Evaluation

## 2020-07-02 LAB — SURGICAL PATHOLOGY

## 2020-07-04 ENCOUNTER — Encounter (HOSPITAL_COMMUNITY): Payer: Self-pay | Admitting: Internal Medicine

## 2020-07-06 ENCOUNTER — Telehealth: Payer: Self-pay | Admitting: Internal Medicine

## 2020-07-06 NOTE — Telephone Encounter (Signed)
Called pt and informed him of results.

## 2020-07-06 NOTE — Telephone Encounter (Signed)
PATIENT CALLED AND SAID SOMEONE FROM HERE WANTED TO SPEAK TO HIM   PLEASE CALL BACK

## 2020-07-09 ENCOUNTER — Encounter: Payer: Self-pay | Admitting: *Deleted

## 2020-07-28 IMAGING — DX CHEST - 2 VIEW
2 series · 2 of 2 positions shown · non-contrast
Comparison: Radiographs July 05, 2017.

CLINICAL DATA: Chest pain.

EXAM:
CHEST - 2 VIEW

[chest pa]
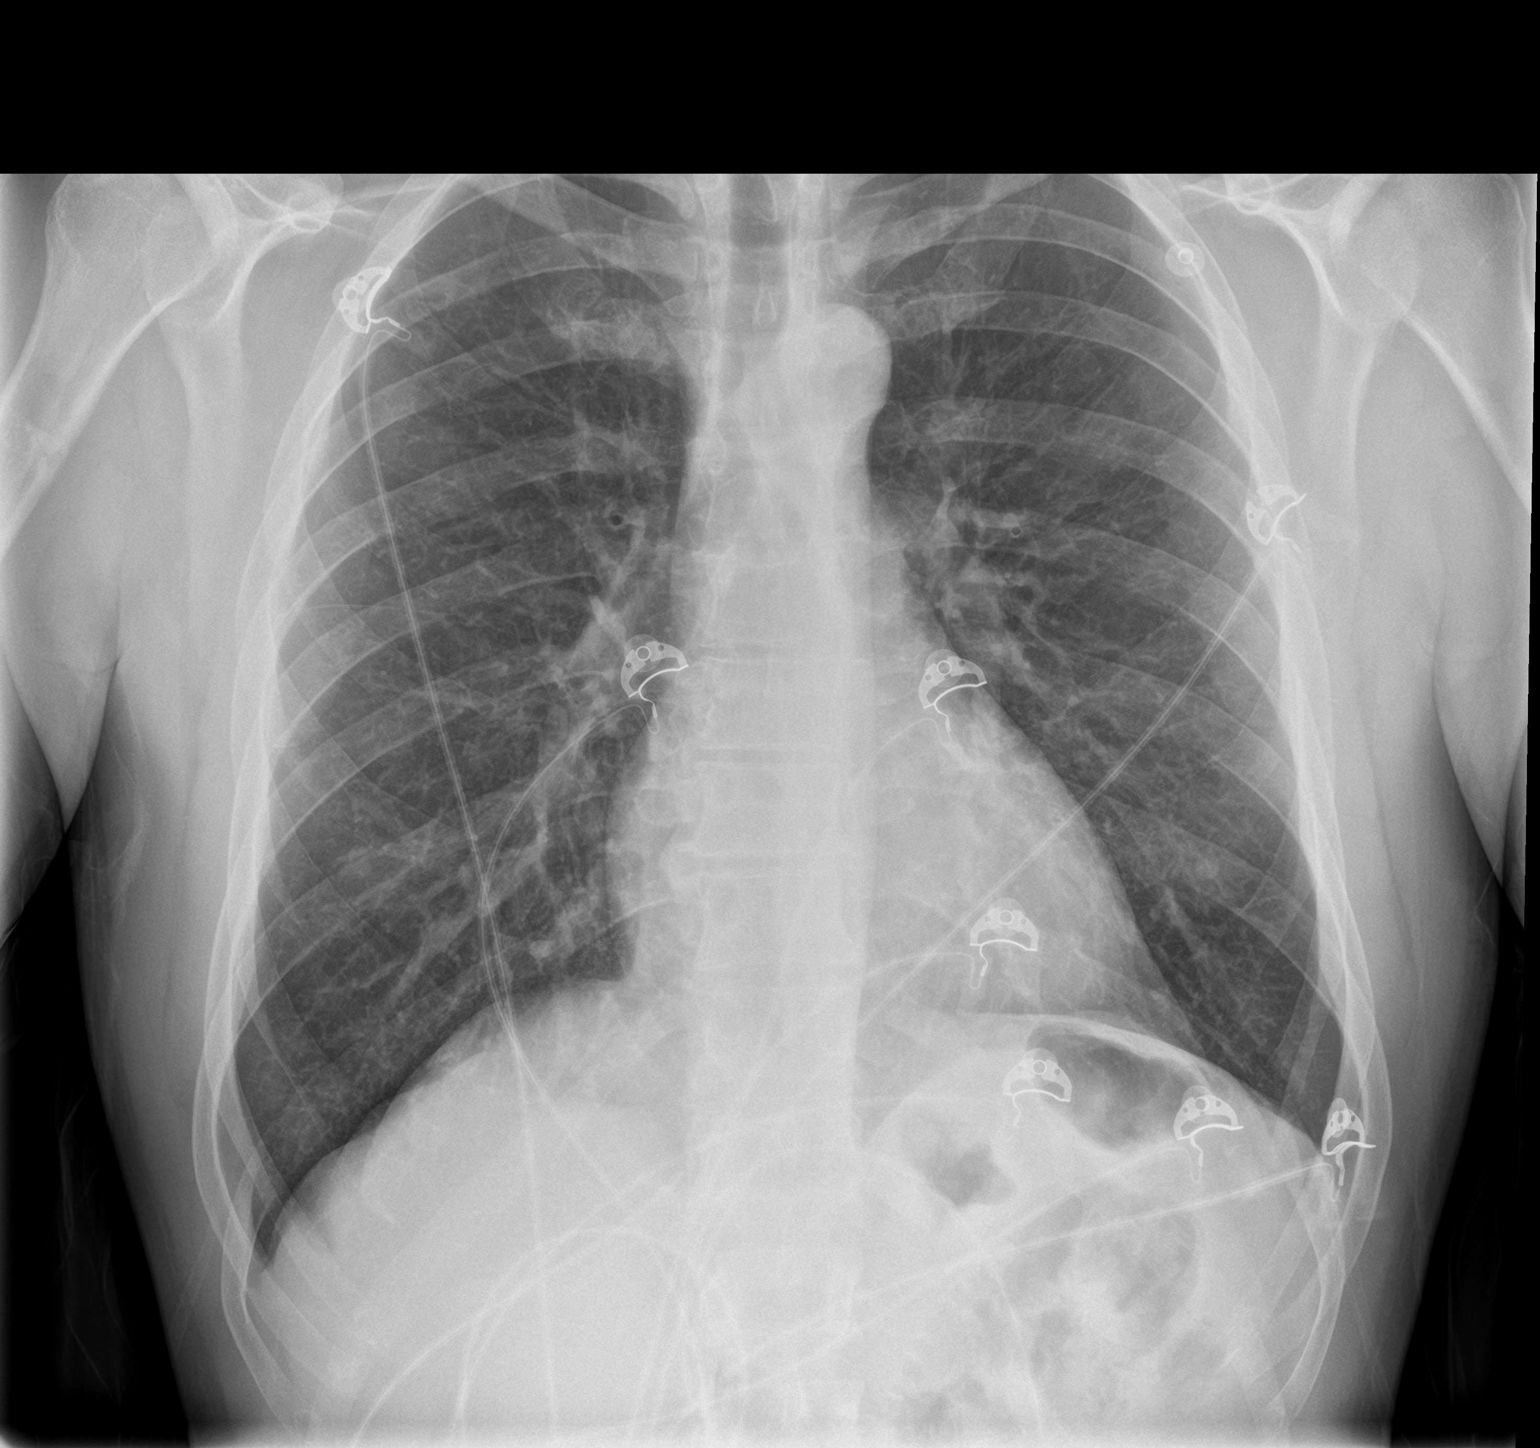

[chest lat]
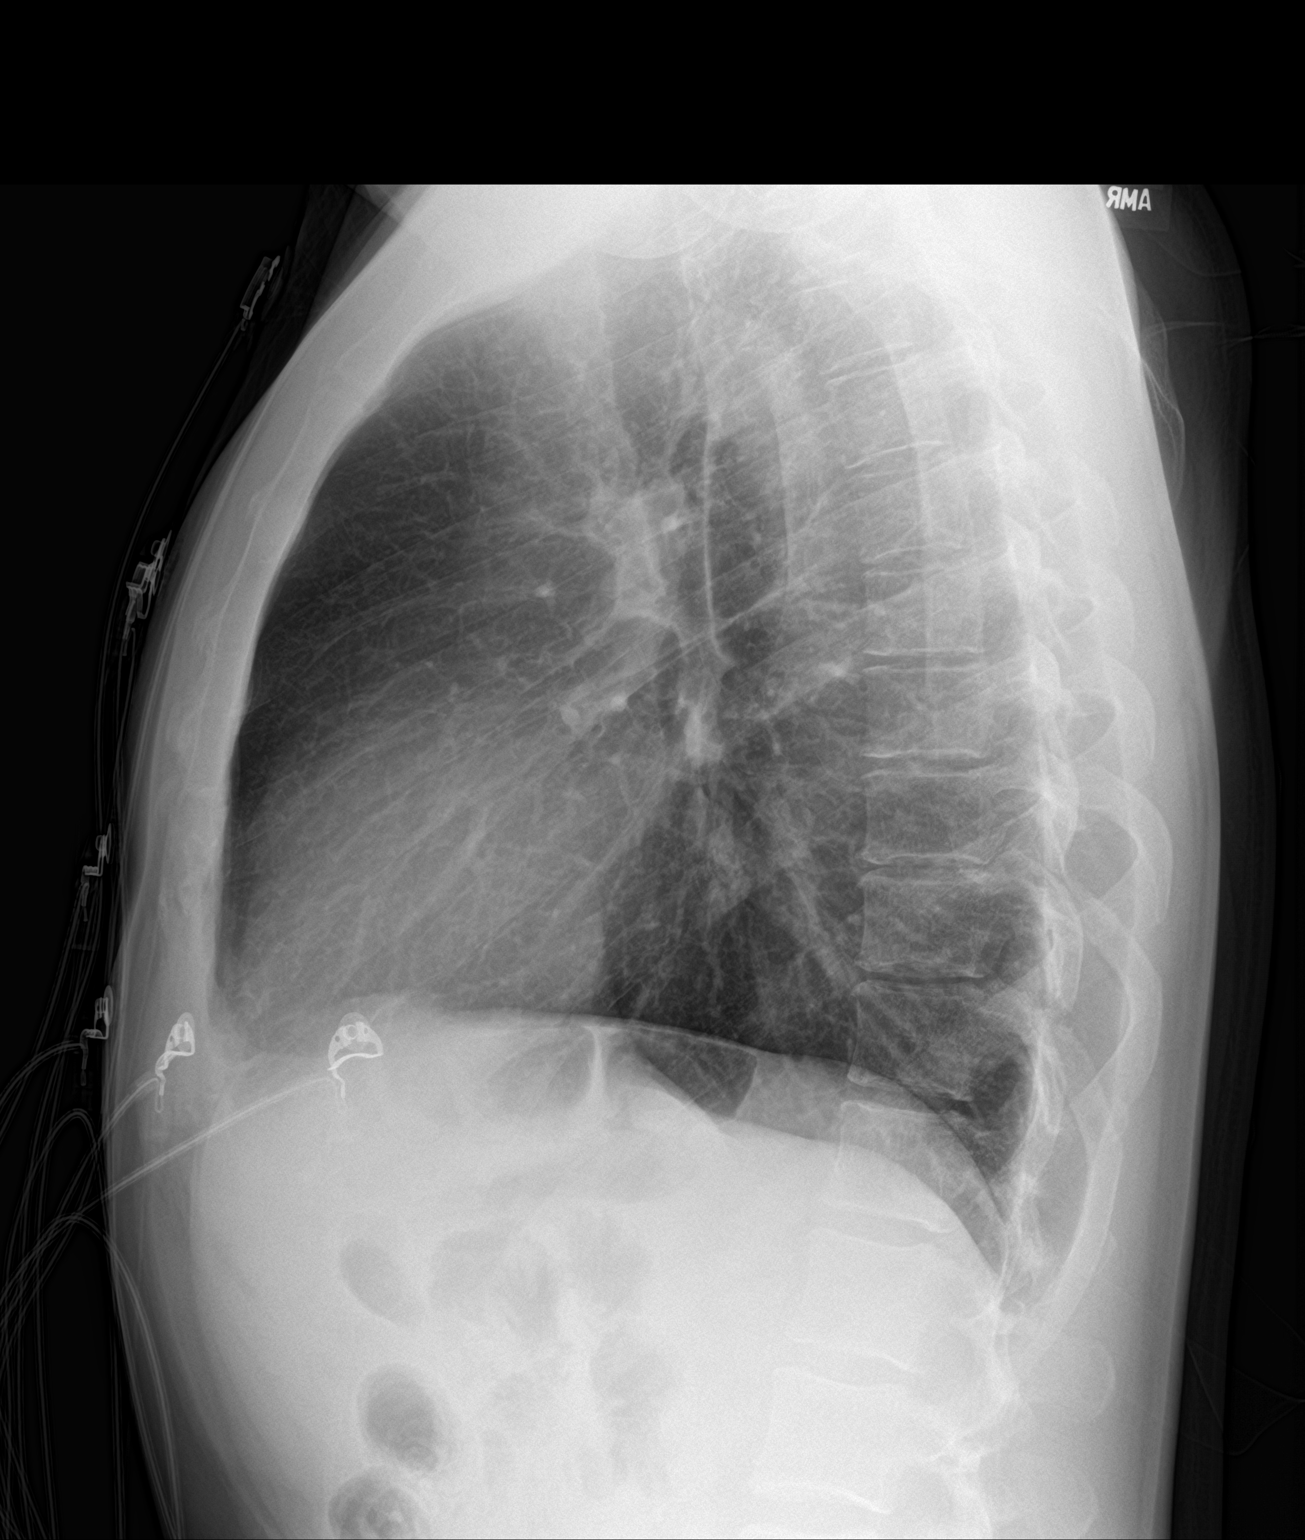

[2 of 2 positions shown; findings below may reference images not displayed]

FINDINGS: The heart size and mediastinal contours are within normal limits.
Both lungs are clear. No pneumothorax or pleural effusion is noted.
The visualized skeletal structures are unremarkable.
IMPRESSION: No active cardiopulmonary disease.

## 2020-08-06 ENCOUNTER — Telehealth: Payer: Self-pay | Admitting: Gastroenterology

## 2020-08-06 NOTE — Telephone Encounter (Signed)
Please NIC for RUQ u/s in 04/2021  Dx: gallbladder polyp

## 2020-08-06 NOTE — Telephone Encounter (Signed)
On recall  °

## 2020-08-17 ENCOUNTER — Emergency Department (HOSPITAL_COMMUNITY)
Admission: EM | Admit: 2020-08-17 | Discharge: 2020-08-17 | Disposition: A | Discharge status: Home / Self Care | Payer: 59 | Attending: Emergency Medicine | Admitting: Emergency Medicine

## 2020-08-17 ENCOUNTER — Encounter (HOSPITAL_COMMUNITY): Payer: Self-pay | Admitting: Emergency Medicine

## 2020-08-17 ENCOUNTER — Other Ambulatory Visit: Payer: Self-pay

## 2020-08-17 DIAGNOSIS — I10 Essential (primary) hypertension: Secondary | ICD-10-CM | POA: Diagnosis not present

## 2020-08-17 DIAGNOSIS — Z87891 Personal history of nicotine dependence: Secondary | ICD-10-CM | POA: Insufficient documentation

## 2020-08-17 DIAGNOSIS — S86101A Unspecified injury of other muscle(s) and tendon(s) of posterior muscle group at lower leg level, right leg, initial encounter: Secondary | ICD-10-CM | POA: Diagnosis present

## 2020-08-17 DIAGNOSIS — Z79899 Other long term (current) drug therapy: Secondary | ICD-10-CM | POA: Diagnosis not present

## 2020-08-17 DIAGNOSIS — S86911A Strain of unspecified muscle(s) and tendon(s) at lower leg level, right leg, initial encounter: Secondary | ICD-10-CM | POA: Diagnosis not present

## 2020-08-17 DIAGNOSIS — S86811A Strain of other muscle(s) and tendon(s) at lower leg level, right leg, initial encounter: Secondary | ICD-10-CM

## 2020-08-17 DIAGNOSIS — Y9341 Activity, dancing: Secondary | ICD-10-CM | POA: Insufficient documentation

## 2020-08-17 DIAGNOSIS — X501XXA Overexertion from prolonged static or awkward postures, initial encounter: Secondary | ICD-10-CM | POA: Insufficient documentation

## 2020-08-17 MED ORDER — IBUPROFEN 400 MG PO TABS
400.0000 mg | ORAL_TABLET | Freq: Once | ORAL | Status: AC
  Administered 2020-08-17: 400 mg via ORAL
  Filled 2020-08-17: qty 1

## 2020-08-17 MED ORDER — HYDROCODONE-ACETAMINOPHEN 5-325 MG PO TABS
1.0000 | ORAL_TABLET | Freq: Once | ORAL | Status: AC
  Administered 2020-08-17: 1 via ORAL
  Filled 2020-08-17: qty 1

## 2020-08-17 NOTE — ED Provider Notes (Signed)
[facility] EMERGENCY DEPARTMENT Provider Note   CSN: [id number] Arrival date & time: 08/17/20  0303     History Chief Complaint  Patient presents with  . Leg Pain    Logan Benton is a 39 y.o. male.  The history is provided by the patient.  Leg Pain Location:  Leg Leg location:  L lower leg Pain details:    Quality:  Cramping   Radiates to:  Does not radiate   Severity:  Severe   Onset quality:  Sudden   Duration:  14 hours   Timing:  Constant   Progression:  Worsening Chronicity:  New Relieved by:  Nothing Worsened by:  Bearing weight Associated symptoms: no back pain, no fever and no muscle weakness    Patient presents with right calf pain.  Patient reports he has had pain in the right calf recently due to his running while working out.  He reports that yesterday during the day he was dancing in the kitchen when he heard a pop in his right calf and had immediate pain.  He is able to ambulate but is difficult.  He then went to work for over 12 hours and then presented to the ER.  No other falls or injuries reported    Past Medical History:  Diagnosis Date  . Abscessed tooth   . Anxiety   . C. difficile diarrhea    presumptive diagnosis  . Depression   . Hypertension   . Trigger finger     Patient Active Problem List   Diagnosis Date Noted  . Abdominal pain, epigastric 06/13/2020  . Proctocolitis 06/13/2020  . Gallbladder polyp 06/13/2020    Past Surgical History:  Procedure Laterality Date  . BILATERAL CARPAL TUNNEL RELEASE    . BIOPSY  06/29/2020   Procedure: BIOPSY;  Surgeon: Eloise Harman, DO;  [facility]: [facility];  Service: Endoscopy;;  . COLONOSCOPY WITH PROPOFOL N/A 06/29/2020   Procedure: COLONOSCOPY WITH PROPOFOL;  Surgeon: Eloise Harman, DO;  Location: [facility];  Service: Endoscopy;  Laterality: N/A;  1:00pm  . ESOPHAGOGASTRODUODENOSCOPY (EGD) WITH PROPOFOL N/A 06/29/2020   Procedure: ESOPHAGOGASTRODUODENOSCOPY (EGD) WITH  PROPOFOL;  Surgeon: Eloise Harman, DO;  [facility]: [facility];  Service: Endoscopy;  Laterality: N/A;  . POLYPECTOMY  06/29/2020   Procedure: POLYPECTOMY;  Surgeon: Eloise Harman, DO;  [facility];  Service: Endoscopy;;  . SHOULDER SURGERY Right    x2  . TRIGGER FINGER RELEASE Right 2016       Family History  Problem Relation Age of Onset  . Rheum arthritis Mother   . Bladder Cancer Mother   . Kidney disease Father   . Bladder Cancer Maternal Grandfather   . Irritable bowel syndrome Paternal Grandmother   . Colon cancer Neg Hx   . Celiac disease Neg Hx     Social History   Tobacco Use  . Smoking status: Former Smoker    Packs/day: 0.00    Years: 0.00    Pack years: 0.00    Quit date: 11/03/2019    Years since quitting: 0.7  . Smokeless tobacco: Never Used  . Tobacco comment: nicoten gum  Vaping Use  . Vaping Use: Former  . Quit date: 03/07/2019  Substance Use Topics  . Alcohol use: No  . Drug use: Not Currently    Home Medications Prior to Admission medications   Medication Sig Start Date End Date Taking? Authorizing Provider  baclofen (LIORESAL) 10 MG tablet  Take 10 mg by mouth 2 (two) times daily.  04/10/20   [provider]  famotidine (PEPCID) 20 MG tablet Take 20 mg by mouth daily. As needed Patient not taking: Reported on 06/21/2020    [provider]  lisinopril (ZESTRIL) 5 MG tablet Take 5 mg by mouth at bedtime. 06/19/20   [provider]  Multiple Vitamin (MULTIVITAMIN WITH MINERALS) TABS tablet Take 1 tablet by mouth daily.    [provider]  omeprazole (PRILOSEC) 40 MG capsule Take 1 capsule (40 mg total) by mouth 2 (two) times daily before a meal. 06/29/20 12/26/20  Eloise Harman, DO  sucralfate (CARAFATE) 1 g tablet Take 1 tablet (1 g total) by mouth 4 (four) times daily. 06/29/20 06/29/21  Eloise Harman, DO    Allergies    Patient has no known allergies.  Review of Systems   Review of Systems   Constitutional: Negative for fever.  Respiratory: Negative for shortness of breath.   Cardiovascular: Negative for chest pain.  Musculoskeletal: Positive for arthralgias. Negative for back pain.  All other systems reviewed and are negative.   Physical Exam Updated Vital Signs BP 131/78   Pulse 79   Temp 98.4 F (36.9 C) (Oral)   Resp 15   Ht 1.803 m (5\' 11" )   Wt 66.7 kg   SpO2 97%   BMI 20.50 kg/m   Physical Exam CONSTITUTIONAL: Well developed/well nourished HEAD: Normocephalic/atraumatic EYES: EOMI/PERRL ENMT: Mucous membranes moist NECK: supple no meningeal signs SPINE/BACK:entire spine nontender CV: S1/S2 noted, no murmurs/rubs/gallops noted LUNGS: Lungs are clear to auscultation bilaterally, no apparent distress ABDOMEN: soft, nontender, no rebound or guarding, bowel sounds noted throughout abdomen GU:no cva tenderness NEURO: Pt is awake/alert/appropriate, moves all extremitiesx4.  No facial droop.   EXTREMITIES: pulses normal/equal, full ROM Right calf-small amount of bruising noted to the central portion of the right calf.  No erythema.  The calf is soft to touch.  No crepitus to the calf  distal pulses are equal/intact.  Right Achilles is intact.  There is no defect noted to the Achilles or any bruising to the Achilles.  He has full range of motion of right knee and ankle.  There is no bony tenderness SKIN: warm, color normal PSYCH: no abnormalities of mood noted, alert and oriented to situation  ED Results / Procedures / Treatments   Labs (all labs ordered are listed, but only abnormal results are displayed) Labs Reviewed - No data to display  EKG None  Radiology No results found.  Procedures Procedures   Medications Ordered in ED Medications  ibuprofen (ADVIL) tablet 400 mg (400 mg Oral Given 08/17/20 0509)  HYDROcodone-acetaminophen (NORCO/VICODIN) 5-325 MG per tablet 1 tablet (1 tablet Oral Given 08/17/20 2683)    ED Course  I have reviewed the  triage vital signs and the nursing notes.      MDM Rules/Calculators/A&P                          Patient with likely right calf strain.  The Achilles appears intact.  Will place on crutches and follow-up with Ortho if no improvement. Advised to keep leg elevated, he can use ice. Patient agreeable with plan Final Clinical Impression(s) / ED Diagnoses Final diagnoses:  Strain of calf muscle, right, initial encounter    Rx / DC Orders ED Discharge Orders    None       Ripley Fraise, MD 08/17/20 [phone]

## 2020-08-17 NOTE — ED Triage Notes (Signed)
Pt c/o right leg pain x 1 week, and states "something popped" in his leg yesterday. Pt ambulatory to triage.

## 2020-08-25 ENCOUNTER — Other Ambulatory Visit: Payer: Self-pay

## 2020-08-25 ENCOUNTER — Ambulatory Visit
Admission: EM | Admit: 2020-08-25 | Discharge: 2020-08-25 | Disposition: A | Discharge status: Home / Self Care | Payer: 59 | Attending: Emergency Medicine | Admitting: Emergency Medicine

## 2020-08-25 ENCOUNTER — Encounter: Payer: Self-pay | Admitting: Emergency Medicine

## 2020-08-25 DIAGNOSIS — Z1152 Encounter for screening for COVID-19: Secondary | ICD-10-CM

## 2020-08-25 DIAGNOSIS — R52 Pain, unspecified: Secondary | ICD-10-CM

## 2020-08-25 DIAGNOSIS — R5383 Other fatigue: Secondary | ICD-10-CM | POA: Diagnosis not present

## 2020-08-25 DIAGNOSIS — R519 Headache, unspecified: Secondary | ICD-10-CM | POA: Diagnosis not present

## 2020-08-25 MED ORDER — DEXAMETHASONE 4 MG PO TABS: 4.0000 mg | ORAL_TABLET | Freq: Every day | ORAL | 0 refills | Status: AC

## 2020-08-25 NOTE — ED Triage Notes (Addendum)
Has mental confusion, dizziness, diarrhea, tickle in his throat, fatigue x 1 week

## 2020-08-25 NOTE — Discharge Instructions (Signed)
  COVID testing ordered.  It will take between 2-7 days for test results.  Someone will contact you regarding abnormal results.    In the meantime: You should remain isolated in your home for 10 days from symptom onset AND greater than 24 hours after symptoms resolution (absence of fever without the use of fever-reducing medication and improvement in respiratory symptoms), whichever is longer Get plenty of rest and push fluids Decadron was prescribed/take as directed Use OTC medications like ibuprofen or tylenol as needed fever or pain Use medications daily for symptom relief Call or go to the ED if you have any new or worsening symptoms such as fever, worsening cough, shortness of breath, chest tightness, chest pain, turning blue, changes in mental status, etc..Marland Kitchen

## 2020-08-25 NOTE — ED Provider Notes (Signed)
[place]   [id number] 08/25/20 Arrival Time: 1119   CC: COVID symptoms  SUBJECTIVE: History from: patient.  Logan Benton is a 39 y.o. male who presented to the urgent care for complaint of fatigue, headache, body aches, dizziness, and itchy throat for the past 1 week.  Denies sick exposure to COVID, flu or strep.  Denies recent travel.  Has tried OTC medication without relief.  Denies aggravating factors.  Denies previous symptoms in the past.   Denies fever, chills, fatigue, sinus pain, rhinorrhea, sore throat, SOB, wheezing, chest pain, nausea, changes in bowel or bladder habits.      ROS: As per HPI.  All other pertinent ROS negative.      Past Medical History:  Diagnosis Date  . Abscessed tooth   . Anxiety   . C. difficile diarrhea    presumptive diagnosis  . Depression   . Hypertension   . Trigger finger    Past Surgical History:  Procedure Laterality Date  . BILATERAL CARPAL TUNNEL RELEASE    . BIOPSY  06/29/2020   Procedure: BIOPSY;  Surgeon: Eloise Harman, DO;  [facility];  Service: Endoscopy;;  . COLONOSCOPY WITH PROPOFOL N/A 06/29/2020   Procedure: COLONOSCOPY WITH PROPOFOL;  Surgeon: Eloise Harman, DO;  [facility];  Service: Endoscopy;  Laterality: N/A;  1:00pm  . ESOPHAGOGASTRODUODENOSCOPY (EGD) WITH PROPOFOL N/A 06/29/2020   Procedure: ESOPHAGOGASTRODUODENOSCOPY (EGD) WITH PROPOFOL;  Surgeon: Eloise Harman, DO;  [facility];  Service: Endoscopy;  Laterality: N/A;  . POLYPECTOMY  06/29/2020   Procedure: POLYPECTOMY;  Surgeon: Eloise Harman, DO;  [facility];  Service: Endoscopy;;  . SHOULDER SURGERY Right    x2  . TRIGGER FINGER RELEASE Right 2016   No Known Allergies No current facility-administered medications on file prior to encounter.   Current Outpatient Medications on File Prior to Encounter  Medication Sig Dispense Refill  . acetaminophen (TYLENOL) 500 MG tablet Take  500-1,000 mg by mouth every 6 (six) hours as needed (for pain.).    Marland Kitchen Ascorbic Acid (VITAMIN C GUMMIE PO) Take 3 tablets by mouth daily.    . baclofen (LIORESAL) 10 MG tablet Take 10 mg by mouth 2 (two) times daily.     Marland Kitchen lisinopril (ZESTRIL) 5 MG tablet Take 5 mg by mouth at bedtime.    . Multiple Vitamins-Minerals (ADULT ONE DAILY GUMMIES PO) Take 2 tablets by mouth daily.    Marland Kitchen omeprazole (PRILOSEC) 40 MG capsule Take 1 capsule (40 mg total) by mouth 2 (two) times daily before a meal. (Patient taking differently: Take 40 mg by mouth daily before breakfast. ) 60 capsule 5  . sucralfate (CARAFATE) 1 g tablet Take 1 tablet (1 g total) by mouth 4 (four) times daily. (Patient not taking: Reported on 08/24/2020) 120 tablet 1   Social History   Socioeconomic History  . Marital status: Divorced    Spouse name: Not on file  . Number of children: Not on file  . Years of education: Not on file  . Highest education level: Not on file  Occupational History  . Not on file  Tobacco Use  . Smoking status: Former Smoker    Packs/day: 0.00    Years: 0.00    Pack years: 0.00    Quit date: 11/03/2019    Years since quitting: 0.8  . Smokeless tobacco: Never Used  . Tobacco comment: nicoten gum  Vaping Use  . Vaping Use:  Former  . Quit date: 03/07/2019  Substance and Sexual Activity  . Alcohol use: No  . Drug use: Not Currently  . Sexual activity: Not on file  Other Topics Concern  . Not on file  Social History Narrative  . Not on file   Social Determinants of Health   Financial Resource Strain:   . Difficulty of Paying Living Expenses: Not on file  Food Insecurity:   . Worried About Charity fundraiser in the Last Year: Not on file  . Ran Out of Food in the Last Year: Not on file  Transportation Needs:   . Lack of Transportation (Medical): Not on file  . Lack of Transportation (Non-Medical): Not on file  Physical Activity:   . Days of Exercise per Week: Not on file  . Minutes of Exercise  per Session: Not on file  Stress:   . Feeling of Stress : Not on file  Social Connections:   . Frequency of Communication with Friends and Family: Not on file  . Frequency of Social Gatherings with Friends and Family: Not on file  . Attends Religious Services: Not on file  . Active Member of Clubs or Organizations: Not on file  . Attends Archivist Meetings: Not on file  . Marital Status: Not on file  Intimate Partner Violence:   . Fear of Current or Ex-Partner: Not on file  . Emotionally Abused: Not on file  . Physically Abused: Not on file  . Sexually Abused: Not on file   Family History  Problem Relation Age of Onset  . Rheum arthritis Mother   . Bladder Cancer Mother   . Kidney disease Father   . Bladder Cancer Maternal Grandfather   . Irritable bowel syndrome Paternal Grandmother   . Colon cancer Neg Hx   . Celiac disease Neg Hx     OBJECTIVE:  Vitals:   08/25/20 1126  BP: 131/80  Pulse: 74  Resp: 15  Temp: 98.2 F (36.8 C)  SpO2: 98%     General appearance: alert; appears fatigued, but nontoxic; speaking in full sentences and tolerating own secretions HEENT: NCAT; Ears: EACs clear, TMs pearly gray; Eyes: PERRL.  EOM grossly intact. Sinuses: nontender; Nose: nares patent without rhinorrhea, Throat: oropharynx clear, tonsils non erythematous or enlarged, uvula midline  Neck: supple without LAD Lungs: unlabored respirations, symmetrical air entry; cough: absent; no respiratory distress; CTAB Heart: regular rate and rhythm.  Radial pulses 2+ symmetrical bilaterally Skin: warm and dry Psychological: alert and cooperative; normal mood and affect  LABS:  No results found for this or any previous visit (from the past 24 hour(s)).   ASSESSMENT & PLAN:  1. Body aches   2. Other fatigue   3. Encounter for screening for COVID-19   4. Acute nonintractable headache, unspecified headache type     Meds ordered this encounter  Medications  . dexamethasone  (DECADRON) 4 MG tablet    Sig: Take 1 tablet (4 mg total) by mouth daily for 7 days.    Dispense:  7 tablet    Refill:  0    Discharge instructions  COVID testing ordered.  It will take between 2-7 days for test results.  Someone will contact you regarding abnormal results.    In the meantime: You should remain isolated in your home for 10 days from symptom onset AND greater than 24 hours after symptoms resolution (absence of fever without the use of fever-reducing medication and improvement in respiratory symptoms), whichever  is longer Get plenty of rest and push fluids Decadron was prescribed/take as directed Use OTC medications like ibuprofen or tylenol as needed fever or pain Use medications daily for symptom relief Call or go to the ED if you have any new or worsening symptoms such as fever, worsening cough, shortness of breath, chest tightness, chest pain, turning blue, changes in mental status, etc...   Reviewed expectations re: course of current medical issues. Questions answered. Outlined signs and symptoms indicating need for more acute intervention. Patient verbalized understanding. After Visit Summary given.         Emerson Monte, [place] 08/25/20 1251

## 2020-08-26 LAB — NOVEL CORONAVIRUS, NAA: SARS-CoV-2, NAA: NOT DETECTED

## 2020-08-26 LAB — SARS-COV-2, NAA 2 DAY TAT

## 2020-08-28 ENCOUNTER — Telehealth: Payer: Self-pay

## 2020-08-28 NOTE — Telephone Encounter (Signed)
Called pt, COVID test rescheduled to tomorrow at 8:00am.

## 2020-08-28 NOTE — Telephone Encounter (Signed)
Melanie at [facility] called office, pt needs covid test tomorrow for procedure scheduled for 09/03/20. COVID testing is closed 08/31/20.   Tried to call pt, no answer, LMOVM for return call.

## 2020-08-29 ENCOUNTER — Other Ambulatory Visit (HOSPITAL_COMMUNITY)
Admission: RE | Admit: 2020-08-29 | Discharge: 2020-08-29 | Disposition: A | Discharge status: Home / Self Care | Payer: 59 | Source: Ambulatory Visit | Attending: Internal Medicine | Admitting: Internal Medicine

## 2020-08-29 ENCOUNTER — Other Ambulatory Visit: Payer: Self-pay

## 2020-08-29 DIAGNOSIS — Z20822 Contact with and (suspected) exposure to covid-19: Secondary | ICD-10-CM | POA: Insufficient documentation

## 2020-08-29 DIAGNOSIS — Z01812 Encounter for preprocedural laboratory examination: Secondary | ICD-10-CM | POA: Diagnosis present

## 2020-08-29 LAB — SARS CORONAVIRUS 2 (TAT 6-24 HRS): SARS Coronavirus 2: NEGATIVE

## 2020-08-31 ENCOUNTER — Other Ambulatory Visit (HOSPITAL_COMMUNITY): Payer: 59

## 2020-09-03 ENCOUNTER — Ambulatory Visit (HOSPITAL_COMMUNITY): Payer: 59 | Admitting: Certified Registered"

## 2020-09-03 ENCOUNTER — Other Ambulatory Visit: Payer: Self-pay

## 2020-09-03 ENCOUNTER — Other Ambulatory Visit (HOSPITAL_COMMUNITY): Payer: 59

## 2020-09-03 ENCOUNTER — Ambulatory Visit (HOSPITAL_COMMUNITY)
Admission: RE | Admit: 2020-09-03 | Discharge: 2020-09-03 | Disposition: A | Discharge status: Home / Self Care | Payer: 59 | Attending: Internal Medicine | Admitting: Internal Medicine

## 2020-09-03 ENCOUNTER — Encounter (HOSPITAL_COMMUNITY)
Admission: RE | Disposition: A | Discharge status: Home / Self Care | Payer: Self-pay | Source: Home / Self Care | Attending: Internal Medicine

## 2020-09-03 ENCOUNTER — Encounter (HOSPITAL_COMMUNITY): Payer: Self-pay

## 2020-09-03 DIAGNOSIS — K2270 Barrett's esophagus without dysplasia: Secondary | ICD-10-CM | POA: Insufficient documentation

## 2020-09-03 DIAGNOSIS — K449 Diaphragmatic hernia without obstruction or gangrene: Secondary | ICD-10-CM | POA: Diagnosis not present

## 2020-09-03 DIAGNOSIS — K30 Functional dyspepsia: Secondary | ICD-10-CM | POA: Insufficient documentation

## 2020-09-03 DIAGNOSIS — Z87891 Personal history of nicotine dependence: Secondary | ICD-10-CM | POA: Insufficient documentation

## 2020-09-03 DIAGNOSIS — Z79899 Other long term (current) drug therapy: Secondary | ICD-10-CM | POA: Insufficient documentation

## 2020-09-03 DIAGNOSIS — Z8719 Personal history of other diseases of the digestive system: Secondary | ICD-10-CM | POA: Diagnosis present

## 2020-09-03 DIAGNOSIS — K209 Esophagitis, unspecified without bleeding: Secondary | ICD-10-CM

## 2020-09-03 DIAGNOSIS — Z09 Encounter for follow-up examination after completed treatment for conditions other than malignant neoplasm: Secondary | ICD-10-CM | POA: Insufficient documentation

## 2020-09-03 HISTORY — PX: BIOPSY: SHX5522

## 2020-09-03 HISTORY — PX: ESOPHAGOGASTRODUODENOSCOPY (EGD) WITH PROPOFOL: SHX5813

## 2020-09-03 SURGERY — ESOPHAGOGASTRODUODENOSCOPY (EGD) WITH PROPOFOL
Anesthesia: General

## 2020-09-03 MED ORDER — LACTATED RINGERS IV SOLN: INTRAVENOUS | Status: DC

## 2020-09-03 MED ORDER — PROPOFOL 500 MG/50ML IV EMUL
INTRAVENOUS | Status: DC | PRN
  Administered 2020-09-03: 125 ug/kg/min via INTRAVENOUS

## 2020-09-03 MED ORDER — STERILE WATER FOR IRRIGATION IR SOLN
Status: DC | PRN
  Administered 2020-09-03: 1.5 mL

## 2020-09-03 MED ORDER — PROPOFOL 10 MG/ML IV BOLUS
INTRAVENOUS | Status: DC | PRN
  Administered 2020-09-03: 200 mg via INTRAVENOUS

## 2020-09-03 MED ORDER — OMEPRAZOLE 20 MG PO CPDR: 20.0000 mg | DELAYED_RELEASE_CAPSULE | Freq: Every day | ORAL | 11 refills | Status: DC

## 2020-09-03 MED ORDER — GLYCOPYRROLATE 0.2 MG/ML IJ SOLN
INTRAMUSCULAR | Status: AC
  Filled 2020-09-03: qty 1

## 2020-09-03 MED ORDER — LIDOCAINE VISCOUS HCL 2 % MT SOLN
OROMUCOSAL | Status: AC
  Filled 2020-09-03: qty 15

## 2020-09-03 MED ORDER — GLYCOPYRROLATE 0.2 MG/ML IJ SOLN
0.2000 mg | Freq: Once | INTRAMUSCULAR | Status: AC
  Administered 2020-09-03: 0.2 mg via INTRAVENOUS

## 2020-09-03 MED ORDER — LIDOCAINE HCL (CARDIAC) PF 50 MG/5ML IV SOSY
PREFILLED_SYRINGE | INTRAVENOUS | Status: DC | PRN
  Administered 2020-09-03: 50 mg via INTRAVENOUS

## 2020-09-03 MED ORDER — LIDOCAINE VISCOUS HCL 2 % MT SOLN
15.0000 mL | Freq: Once | OROMUCOSAL | Status: AC
  Administered 2020-09-03: 15 mL via OROMUCOSAL

## 2020-09-03 NOTE — Addendum Note (Signed)
Addendum  created 09/03/20 0805 by Vista Deck, CRNA   Child order released for a procedure order, Clinical Note Signed, Intraprocedure Blocks edited

## 2020-09-03 NOTE — Anesthesia Procedure Notes (Signed)
Date/Time: 09/03/2020 7:28 AM Performed by: Vista Deck, CRNA Pre-anesthesia Checklist: Patient identified, Emergency Drugs available, Suction available, Timeout performed and Patient being monitored Patient Re-evaluated:Patient Re-evaluated prior to induction Oxygen Delivery Method: Nasal Cannula

## 2020-09-03 NOTE — Transfer of Care (Signed)
Immediate Anesthesia Transfer of Care Note  Patient: Logan Benton  Procedure(s) Performed: ESOPHAGOGASTRODUODENOSCOPY (EGD) WITH PROPOFOL (N/A ) BIOPSY  Patient Location: Endoscopy Unit  Anesthesia Type:General  Level of Consciousness: awake, alert  and patient cooperative  Airway & Oxygen Therapy: Patient Spontanous Breathing  Post-op Assessment: Report given to RN and Post -op Vital signs reviewed and stable  Post vital signs: Reviewed and stable  Last Vitals:  Vitals Value Taken Time  BP    Temp    Pulse    Resp    SpO2      Last Pain:  Vitals:   09/03/20 0728  TempSrc:   PainSc: 0-No pain      Patients Stated Pain Goal: 7 (36/06/77 0340)  Complications: No complications documented.

## 2020-09-03 NOTE — Op Note (Signed)
[facility] Patient Name: Logan Benton Procedure Date: 09/03/2020 7:07 AM MRN: [id number] Date of Birth: 01/20/1981 Attending MD: Elon Alas. Abbey Chatters DO CSN: [id number] Age: 39 Admit Type: Inpatient Procedure:                Upper GI endoscopy Indications:              Functional Dyspepsia, Suspected Barrett's                            esophagus, Follow-up of esophagitis Providers:                Elon Alas. Abbey Chatters, DO, Caprice Kluver, Casimer Bilis, Technician Referring MD:              Medicines:                See the Anesthesia note for documentation of the                            administered medications Complications:            No immediate complications. Estimated Blood Loss:     Estimated blood loss was minimal. Procedure:                Pre-Anesthesia Assessment:                           - The anesthesia plan was to use monitored                            anesthesia care (MAC).                           After obtaining informed consent, the endoscope was                            passed under direct vision. Throughout the                            procedure, the patient's blood pressure, pulse, and                            oxygen saturations were monitored continuously. The                            [id number]-H190 ([id number]) scope was introduced through the                            mouth, and advanced to the second part of duodenum.                            The upper GI endoscopy was accomplished without                            difficulty. The patient tolerated the procedure  well. Scope In: 7:32:13 AM Scope Out: 7:34:04 AM Total Procedure Duration: 0 hours 1 minute 51 seconds  Findings:      There is no endoscopic evidence of esophagitis in the entire esophagus.      A small hiatal hernia was present.      The esophagus and gastroesophageal junction were examined with white       light and  narrow band imaging (NBI) from a forward view and retroflexed       position. There were esophageal mucosal changes suggestive of       short-segment Barrett's esophagus. These changes involved the mucosa       extending to the Z-line. Two tongues of salmon-colored mucosa were       present. The maximum longitudinal extent of these esophageal mucosal       changes was 2 cm in length. Mucosa was biopsied with a cold forceps for       histology.      The entire examined stomach was normal.      The duodenal bulb, first portion of the duodenum and second portion of       the duodenum were normal. Impression:               - Small hiatal hernia.                           - Esophageal mucosal changes suggestive of                            short-segment Barrett's esophagus. Biopsied.                           - Normal stomach.                           - Normal duodenal bulb, first portion of the                            duodenum and second portion of the duodenum. Moderate Sedation:      Per Anesthesia Care Recommendation:           - Patient has a contact number available for                            emergencies. The signs and symptoms of potential                            delayed complications were discussed with the                            patient. Return to normal activities tomorrow.                            Written discharge instructions were provided to the                            patient.                           -  Resume previous diet.                           - Continue present medications.                           - Await pathology results.                           - Repeat upper endoscopy in 3 years for                            surveillance based on pathology results.                           - Return to GI clinic PRN.                           - Use a proton pump inhibitor PO daily. Procedure Code(s):        --- Professional ---                            [phone], Esophagogastroduodenoscopy, flexible,                            transoral; with biopsy, single or multiple Diagnosis Code(s):        --- Professional ---                           K44.9, Diaphragmatic hernia without obstruction or                            gangrene                           K22.8, Other specified diseases of esophagus                           K30, Functional dyspepsia                           K20.90, Esophagitis, unspecified without bleeding CPT copyright 2019 [employer]. All rights reserved. The codes documented in this report are preliminary and upon coder review may  be revised to meet current compliance requirements. Elon Alas. Abbey Chatters, DO [place] Abbey Chatters, DO 09/03/2020 8:14:34 AM This report has been signed electronically. Number of Addenda: 0

## 2020-09-03 NOTE — H&P (Signed)
Primary Care Physician:  Leonie Douglas, MD Primary Gastroenterologist:  Dr. Abbey Chatters  Pre-Procedure History & Physical: HPI:  Logan Benton is a 39 y.o. male is here for an EGD due to history of LA Grade C esophagitis seen on EGD 8-10 weeks ago. Patient feeling much better on PPI therapy. GI symptoms have improved. Needs evaluation for healing as well as for possible Barrett's.  Patient denies any family history of colorectal cancer.  No melena or hematochezia.  No abdominal pain or unintentional weight loss.  No change in bowel habits.  Overall feels well from a GI standpoint.  Past Medical History:  Diagnosis Date  . Abscessed tooth   . Anxiety   . C. difficile diarrhea    presumptive diagnosis  . Depression   . Hypertension   . Trigger finger     Past Surgical History:  Procedure Laterality Date  . BILATERAL CARPAL TUNNEL RELEASE    . BIOPSY  06/29/2020   Procedure: BIOPSY;  Surgeon: Eloise Harman, DO;  [facility];  Service: Endoscopy;;  . COLONOSCOPY WITH PROPOFOL N/A 06/29/2020   Procedure: COLONOSCOPY WITH PROPOFOL;  Surgeon: Eloise Harman, DO;  [facility];  Service: Endoscopy;  Laterality: N/A;  1:00pm  . ESOPHAGOGASTRODUODENOSCOPY (EGD) WITH PROPOFOL N/A 06/29/2020   Procedure: ESOPHAGOGASTRODUODENOSCOPY (EGD) WITH PROPOFOL;  Surgeon: Eloise Harman, DO;  [facility];  Service: Endoscopy;  Laterality: N/A;  . POLYPECTOMY  06/29/2020   Procedure: POLYPECTOMY;  Surgeon: Eloise Harman, DO;  [facility];  Service: Endoscopy;;  . SHOULDER SURGERY Right    x2  . TRIGGER FINGER RELEASE Right 2016    Prior to Admission medications   Medication Sig Start Date End Date Taking? Authorizing Provider  acetaminophen (TYLENOL) 500 MG tablet Take 500-1,000 mg by mouth every 6 (six) hours as needed (for pain.).   Yes [provider]  Ascorbic Acid (VITAMIN C GUMMIE PO) Take 3 tablets by mouth daily.   Yes  [provider]  baclofen (LIORESAL) 10 MG tablet Take 10 mg by mouth 2 (two) times daily.  04/10/20  Yes [provider]  lisinopril (ZESTRIL) 5 MG tablet Take 5 mg by mouth at bedtime. 06/19/20  Yes [provider]  Multiple Vitamins-Minerals (ADULT ONE DAILY GUMMIES PO) Take 2 tablets by mouth daily.   Yes [provider]  omeprazole (PRILOSEC) 40 MG capsule Take 1 capsule (40 mg total) by mouth 2 (two) times daily before a meal. Patient taking differently: Take 40 mg by mouth daily before breakfast.  06/29/20 12/26/20 Yes Despina Boan, Elon Alas, DO  sucralfate (CARAFATE) 1 g tablet Take 1 tablet (1 g total) by mouth 4 (four) times daily. Patient not taking: Reported on 08/24/2020 06/29/20 06/29/21  Eloise Harman, DO    Allergies as of 07/09/2020  . (No Known Allergies)    Family History  Problem Relation Age of Onset  . Rheum arthritis Mother   . Bladder Cancer Mother   . Kidney disease Father   . Bladder Cancer Maternal Grandfather   . Irritable bowel syndrome Paternal Grandmother   . Colon cancer Neg Hx   . Celiac disease Neg Hx     Social History   Socioeconomic History  . Marital status: Divorced    Spouse name: Not on file  . Number of children: Not on file  . Years of education: Not on file  . Highest education level: Not on file  Occupational History  .  Not on file  Tobacco Use  . Smoking status: Former Smoker    Packs/day: 0.00    Years: 0.00    Pack years: 0.00    Quit date: 11/03/2019    Years since quitting: 0.8  . Smokeless tobacco: Never Used  . Tobacco comment: nicoten gum  Vaping Use  . Vaping Use: Former  . Quit date: 03/07/2019  Substance and Sexual Activity  . Alcohol use: No  . Drug use: Not Currently  . Sexual activity: Not on file  Other Topics Concern  . Not on file  Social History Narrative  . Not on file   Social Determinants of Health   Financial Resource Strain:   . Difficulty of Paying Living Expenses:  Not on file  Food Insecurity:   . Worried About Charity fundraiser in the Last Year: Not on file  . Ran Out of Food in the Last Year: Not on file  Transportation Needs:   . Lack of Transportation (Medical): Not on file  . Lack of Transportation (Non-Medical): Not on file  Physical Activity:   . Days of Exercise per Week: Not on file  . Minutes of Exercise per Session: Not on file  Stress:   . Feeling of Stress : Not on file  Social Connections:   . Frequency of Communication with Friends and Family: Not on file  . Frequency of Social Gatherings with Friends and Family: Not on file  . Attends Religious Services: Not on file  . Active Member of Clubs or Organizations: Not on file  . Attends Archivist Meetings: Not on file  . Marital Status: Not on file  Intimate Partner Violence:   . Fear of Current or Ex-Partner: Not on file  . Emotionally Abused: Not on file  . Physically Abused: Not on file  . Sexually Abused: Not on file    Review of Systems: See HPI, otherwise negative ROS  Impression/Plan: Logan Benton is here for an EGD due to history of LA grade C esophagitis to evaluate healing and biopsy for Barrett's.  The risks of the procedure including infection, bleed, or perforation as well as benefits, limitations, alternatives and imponderables have been reviewed with the patient. Questions have been answered. All parties agreeable.

## 2020-09-03 NOTE — Discharge Instructions (Addendum)
EGD Discharge instructions Please read the instructions outlined below and refer to this sheet in the next few weeks. These discharge instructions provide you with general information on caring for yourself after you leave the hospital. Your doctor may also give you specific instructions. While your treatment has been planned according to the most current medical practices available, unavoidable complications occasionally occur. If you have any problems or questions after discharge, please call your doctor. ACTIVITY  You may resume your regular activity but move at a slower pace for the next 24 hours.   Take frequent rest periods for the next 24 hours.   Walking will help expel (get rid of) the air and reduce the bloated feeling in your abdomen.   No driving for 24 hours (because of the anesthesia (medicine) used during the test).   You may shower.   Do not sign any important legal documents or operate any machinery for 24 hours (because of the anesthesia used during the test).  NUTRITION  Drink plenty of fluids.   You may resume your normal diet.   Begin with a light meal and progress to your normal diet.   Avoid alcoholic beverages for 24 hours or as instructed by your caregiver.  MEDICATIONS  You may resume your normal medications unless your caregiver tells you otherwise.  WHAT YOU CAN EXPECT TODAY  You may experience abdominal discomfort such as a feeling of fullness or "gas" pains.  FOLLOW-UP  Your doctor will discuss the results of your test with you.  SEEK IMMEDIATE MEDICAL ATTENTION IF ANY OF THE FOLLOWING OCCUR:  Excessive nausea (feeling sick to your stomach) and/or vomiting.   Severe abdominal pain and distention (swelling).   Trouble swallowing.   Temperature over 101 F (37.8 C).   Rectal bleeding or vomiting of blood.    Hernia, Adult     A hernia happens when tissue inside your body pushes out through a weak spot in your belly muscles (abdominal  wall). This makes a round lump (bulge). The lump may be:  In a scar from surgery that was done in your belly (incisional hernia).  Near your belly button (umbilical hernia).  In your groin (inguinal hernia). Your groin is the area where your leg meets your lower belly (abdomen). This kind of hernia could also be: ? In your scrotum, if you are male. ? In folds of skin around your vagina, if you are male.  In your upper thigh (femoral hernia).  Inside your belly (hiatal hernia). This happens when your stomach slides above the muscle between your belly and your chest (diaphragm). If your hernia is small and it does not cause pain, you may not need treatment. If your hernia is large or it causes pain, you may need surgery. Follow these instructions at home: Activity  Avoid stretching or overusing (straining) the muscles near your hernia. Straining can happen when you: ? Lift something heavy. ? Poop (have a bowel movement).  Do not lift anything that is heavier than 10 lb (4.5 kg), or the limit that you are told, until your doctor says that it is safe.  Use the strength of your legs when you lift something heavy. Do not use only your back muscles to lift. General instructions  Do these things if told by your doctor so you do not have trouble pooping (constipation): ? Drink enough fluid to keep your pee (urine) pale yellow. ? Eat foods that are high in fiber. These include fresh fruits and vegetables,  whole grains, and beans. ? Limit foods that are high in fat and processed sugars. These include foods that are fried or sweet. ? Take medicine for trouble pooping.  When you cough, try to cough gently.  You may try to push your hernia in by very gently pressing on it when you are lying down. Do not try to force the bulge back in if it will not push in easily.  If you are overweight, work with your doctor to lose weight safely.  Do not use any products that have nicotine or tobacco in  them. These include cigarettes and e-cigarettes. If you need help quitting, ask your doctor.  If you will be having surgery (hernia repair), watch your hernia for changes in shape, size, or color. Tell your doctor if you see any changes.  Take over-the-counter and prescription medicines only as told by your doctor.  Keep all follow-up visits as told by your doctor. Contact a doctor if:  You get new pain, swelling, or redness near your hernia.  You poop fewer times in a week than normal.  You have trouble pooping.  You have poop (stool) that is more dry than normal.  You have poop that is harder or larger than normal. Get help right away if:  You have a fever.  You have belly pain that gets worse.  You feel sick to your stomach (nauseous).  You throw up (vomit).  Your hernia cannot be pushed in by very gently pressing on it when you are lying down. Do not try to force the bulge back in if it will not push in easily.  Your hernia: ? Changes in shape or size. ? Changes color. ? Feels hard or it hurts when you touch it. These symptoms may represent a serious problem that is an emergency. Do not wait to see if the symptoms will go away. Get medical help right away. Call your local emergency services (911 in the [place] Summary  A hernia happens when tissue inside your body pushes out through a weak spot in the belly muscles. This creates a bulge.  If your hernia is small and it does not hurt, you may not need treatment. If your hernia is large or it hurts, you may need surgery.  If you will be having surgery, watch your hernia for changes in shape, size, or color. Tell your doctor about any changes. This information is not intended to replace advice given to you by your health care provider. Make sure you discuss any questions you have with your health care provider. Document Revised: 01/13/2019 Document Reviewed: 06/24/2017 Elsevier Patient Education  [employer].   EGD showed that your esophagitis has healed.  Does appear that you may have underlying Barrett's esophagus which I biopsied as well as a small hiatal hernia.  Your stomach and small bowel looks normal.  Await pathology results.  My office will contact you.  We will plan on repeat EGD in 3 years pending pathology results.  Okay to decrease to omeprazole 20 mg daily.  Follow-up with GI as needed.  I hope you have a great rest of your week!  Elon Alas. Abbey Chatters, D.O. Gastroenterology and Hepatology [facility]

## 2020-09-03 NOTE — Anesthesia Postprocedure Evaluation (Signed)
Anesthesia Post Note  Patient: Logan Benton  Procedure(s) Performed: ESOPHAGOGASTRODUODENOSCOPY (EGD) WITH PROPOFOL (N/A ) BIOPSY  Patient location during evaluation: Endoscopy Anesthesia Type: General Level of consciousness: awake and alert and patient cooperative Pain management: satisfactory to patient Vital Signs Assessment: post-procedure vital signs reviewed and stable Respiratory status: spontaneous breathing Cardiovascular status: stable Postop Assessment: no apparent nausea or vomiting Anesthetic complications: no   No complications documented.   Last Vitals:  Vitals:   09/03/20 0647 09/03/20 0742  BP: 119/68 112/63  Pulse: 84 70  Resp: 12 18  Temp: 37.1 C 36.4 C  SpO2: 100% 97%    Last Pain:  Vitals:   09/03/20 0742  TempSrc: Oral  PainSc: 0-No pain                 Krew Hortman

## 2020-09-03 NOTE — Anesthesia Preprocedure Evaluation (Signed)
Anesthesia Evaluation  Patient identified by MRN, date of birth, ID band Patient awake    Reviewed: Allergy & Precautions, H&P , NPO status , Patient's Chart, lab work & pertinent test results, reviewed documented beta blocker date and time   Airway Mallampati: II  TM Distance: >3 FB Neck ROM: full    Dental no notable dental hx.    Pulmonary neg pulmonary ROS, former smoker,    Pulmonary exam normal breath sounds clear to auscultation       Cardiovascular Exercise Tolerance: Good hypertension, negative cardio ROS   Rhythm:regular Rate:Normal     Neuro/Psych PSYCHIATRIC DISORDERS Anxiety Depression negative neurological ROS     GI/Hepatic negative GI ROS, Neg liver ROS,   Endo/Other  negative endocrine ROS  Renal/GU negative Renal ROS  negative genitourinary   Musculoskeletal   Abdominal   Peds  Hematology negative hematology ROS (+)   Anesthesia Other Findings   Reproductive/Obstetrics negative OB ROS                             Anesthesia Physical  Anesthesia Plan  ASA: II  Anesthesia Plan: General   Post-op Pain Management:    Induction:   PONV Risk Score and Plan: Propofol infusion  Airway Management Planned:   Additional Equipment:   Intra-op Plan:   Post-operative Plan:   Informed Consent: I have reviewed the patients History and Physical, chart, labs and discussed the procedure including the risks, benefits and alternatives for the proposed anesthesia with the patient or authorized representative who has indicated his/her understanding and acceptance.     Dental Advisory Given  Plan Discussed with: CRNA  Anesthesia Plan Comments:         Anesthesia Quick Evaluation  

## 2020-09-04 LAB — SURGICAL PATHOLOGY

## 2020-09-06 ENCOUNTER — Encounter (HOSPITAL_COMMUNITY): Payer: Self-pay | Admitting: Internal Medicine

## 2020-09-07 NOTE — Progress Notes (Signed)
Dear Mr. Cisnero  We have tried to reach you on several occasions regarding your results. Please call our office at [phone] at your earliest convenience.    Thank you' Floria Raveling, CMA

## 2020-12-05 ENCOUNTER — Telehealth: Payer: Self-pay | Admitting: Internal Medicine

## 2020-12-05 NOTE — Telephone Encounter (Signed)
Per 08/06/2020 phone note Please NIC for RUQ u/s in 04/2021  Dx: gallbladder polyp   thanks

## 2020-12-05 NOTE — Telephone Encounter (Signed)
Recall for ultrasound 

## 2020-12-15 ENCOUNTER — Emergency Department (HOSPITAL_COMMUNITY)
Admission: EM | Admit: 2020-12-15 | Discharge: 2020-12-15 | Disposition: A | Discharge status: Home / Self Care | Payer: 59 | Attending: Emergency Medicine | Admitting: Emergency Medicine

## 2020-12-15 ENCOUNTER — Emergency Department (HOSPITAL_COMMUNITY): Payer: 59

## 2020-12-15 ENCOUNTER — Other Ambulatory Visit: Payer: Self-pay

## 2020-12-15 ENCOUNTER — Encounter (HOSPITAL_COMMUNITY): Payer: Self-pay | Admitting: Emergency Medicine

## 2020-12-15 DIAGNOSIS — B820 Intestinal helminthiasis, unspecified: Secondary | ICD-10-CM | POA: Insufficient documentation

## 2020-12-15 DIAGNOSIS — Z79899 Other long term (current) drug therapy: Secondary | ICD-10-CM | POA: Insufficient documentation

## 2020-12-15 DIAGNOSIS — R140 Abdominal distension (gaseous): Secondary | ICD-10-CM | POA: Diagnosis present

## 2020-12-15 DIAGNOSIS — I10 Essential (primary) hypertension: Secondary | ICD-10-CM | POA: Insufficient documentation

## 2020-12-15 DIAGNOSIS — Z87891 Personal history of nicotine dependence: Secondary | ICD-10-CM | POA: Diagnosis not present

## 2020-12-15 LAB — COMPREHENSIVE METABOLIC PANEL
ALT: 36 U/L (ref 0–44)
AST: 27 U/L (ref 15–41)
Albumin: 4.3 g/dL (ref 3.5–5.0)
Alkaline Phosphatase: 41 U/L (ref 38–126)
Anion gap: 8 (ref 5–15)
BUN: 15 mg/dL (ref 6–20)
CO2: 26 mmol/L (ref 22–32)
Calcium: 9.3 mg/dL (ref 8.9–10.3)
Chloride: 101 mmol/L (ref 98–111)
Creatinine, Ser: 0.95 mg/dL (ref 0.61–1.24)
GFR, Estimated: >60 mL/min (ref >60)
Glucose, Bld: 99 mg/dL (ref 70–99)
Potassium: 3.9 mmol/L (ref 3.5–5.1)
Sodium: 135 mmol/L (ref 135–145)
Total Bilirubin: 0.9 mg/dL (ref 0.3–1.2)
Total Protein: 6.9 g/dL (ref 6.5–8.1)

## 2020-12-15 LAB — URINALYSIS, ROUTINE W REFLEX MICROSCOPIC
Bilirubin Urine: NEGATIVE
Glucose, UA: NEGATIVE mg/dL
Hgb urine dipstick: NEGATIVE
Ketones, ur: NEGATIVE mg/dL
Leukocytes,Ua: NEGATIVE
Nitrite: NEGATIVE
Protein, ur: NEGATIVE mg/dL
Specific Gravity, Urine: 1.003 — ABNORMAL LOW (ref 1.005–1.030)
pH: 6 (ref 5.0–8.0)

## 2020-12-15 LAB — CBC WITH DIFFERENTIAL/PLATELET
Abs Immature Granulocytes: 0.01 10*3/uL (ref 0.00–0.07)
Basophils Absolute: 0 10*3/uL (ref 0.0–0.1)
Basophils Relative: 0 %
Eosinophils Absolute: 0 10*3/uL (ref 0.0–0.5)
Eosinophils Relative: 1 %
HCT: 44.9 % (ref 39.0–52.0)
Hemoglobin: 14.9 g/dL (ref 13.0–17.0)
Immature Granulocytes: 0 %
Lymphocytes Relative: 32 %
Lymphs Abs: 1.9 10*3/uL (ref 0.7–4.0)
MCH: 31.4 pg (ref 26.0–34.0)
MCHC: 33.2 g/dL (ref 30.0–36.0)
MCV: 94.7 fL (ref 80.0–100.0)
Monocytes Absolute: 0.6 10*3/uL (ref 0.1–1.0)
Monocytes Relative: 10 %
Neutro Abs: 3.5 10*3/uL (ref 1.7–7.7)
Neutrophils Relative %: 57 %
Platelets: 251 10*3/uL (ref 150–400)
RBC: 4.74 MIL/uL (ref 4.22–5.81)
RDW: 12.2 % (ref 11.5–15.5)
WBC: 6.1 10*3/uL (ref 4.0–10.5)
nRBC: 0 % (ref 0.0–0.2)

## 2020-12-15 LAB — LIPASE, BLOOD: Lipase: 23 U/L (ref 11–51)

## 2020-12-15 MED ORDER — PRAZIQUANTEL 600 MG PO TABS: ORAL_TABLET | ORAL | 0 refills | Status: DC

## 2020-12-15 NOTE — ED Provider Notes (Signed)
[facility] EMERGENCY DEPARTMENT Provider Note   CSN: [id number] Arrival date & time: 12/15/20  1305     History Chief Complaint  Patient presents with  . Abdominal Pain    Logan Benton is a 40 y.o. male presenting for evaluation of suspected intestinal parasites.  He reports being treated for pinworms (threadworms) by his pcp having had multiple doses of albendazole, but continues to see white fragments in his stool.  He now is seeing thicker, fatter "segments" and notes that his cat had to be treated several months ago for tapeworm.  He endorses increased hunger, abdominal bloating and a 10-12 lb weight loss since his symptoms initially began.  His pcp is arranging GI evaluation currently.  He has not had stool studies performed, but tx for pinworms based on hx. He denies fevers or chills, n/v, has had no blood in stools.  The history is provided by the patient.       Past Medical History:  Diagnosis Date  . Abscessed tooth   . Anxiety   . C. difficile diarrhea    presumptive diagnosis  . Depression   . Hypertension   . Trigger finger     Patient Active Problem List   Diagnosis Date Noted  . Abdominal pain, epigastric 06/13/2020  . Proctocolitis 06/13/2020  . Gallbladder polyp 06/13/2020    Past Surgical History:  Procedure Laterality Date  . BILATERAL CARPAL TUNNEL RELEASE    . BIOPSY  06/29/2020   Procedure: BIOPSY;  Surgeon: Eloise Harman, DO;  [facility];  Service: Endoscopy;;  . BIOPSY  09/03/2020   Procedure: BIOPSY;  Surgeon: Eloise Harman, DO;  [facility];  Service: Endoscopy;;  . COLONOSCOPY WITH PROPOFOL N/A 06/29/2020   Procedure: COLONOSCOPY WITH PROPOFOL;  Surgeon: Eloise Harman, DO;  [facility];  Service: Endoscopy;  Laterality: N/A;  1:00pm  . ESOPHAGOGASTRODUODENOSCOPY (EGD) WITH PROPOFOL N/A 06/29/2020   Procedure: ESOPHAGOGASTRODUODENOSCOPY (EGD) WITH PROPOFOL;  Surgeon: Eloise Harman, DO;   [facility];  Service: Endoscopy;  Laterality: N/A;  . ESOPHAGOGASTRODUODENOSCOPY (EGD) WITH PROPOFOL N/A 09/03/2020   Procedure: ESOPHAGOGASTRODUODENOSCOPY (EGD) WITH PROPOFOL;  Surgeon: Eloise Harman, DO;  [facility];  Service: Endoscopy;  Laterality: N/A;  7:30am  . POLYPECTOMY  06/29/2020   Procedure: POLYPECTOMY;  Surgeon: Eloise Harman, DO;  [facility];  Service: Endoscopy;;  . SHOULDER SURGERY Right    x2  . TRIGGER FINGER RELEASE Right 2016       Family History  Problem Relation Age of Onset  . Rheum arthritis Mother   . Bladder Cancer Mother   . Kidney disease Father   . Bladder Cancer Maternal Grandfather   . Irritable bowel syndrome Paternal Grandmother   . Colon cancer Neg Hx   . Celiac disease Neg Hx     Social History   Tobacco Use  . Smoking status: Former Smoker    Packs/day: 0.00    Years: 0.00    Pack years: 0.00    Quit date: 11/03/2019    Years since quitting: 1.1  . Smokeless tobacco: Never Used  . Tobacco comment: nicoten gum  Vaping Use  . Vaping Use: Former  . Quit date: 03/07/2019  Substance Use Topics  . Alcohol use: No  . Drug use: Not Currently    Home Medications Prior to Admission medications   Medication Sig Start Date End Date Taking? Authorizing Provider  acetaminophen (TYLENOL)  500 MG tablet Take 500-1,000 mg by mouth every 6 (six) hours as needed (for pain.).   Yes [provider]  Ascorbic Acid (VITAMIN C GUMMIE PO) Take 3 tablets by mouth daily.   Yes [provider]  baclofen (LIORESAL) 10 MG tablet Take 10 mg by mouth 2 (two) times daily.  04/10/20  Yes [provider]  lisinopril (ZESTRIL) 5 MG tablet Take 5 mg by mouth at bedtime. 06/19/20  Yes [provider]  Multiple Vitamins-Minerals (ADULT ONE DAILY GUMMIES PO) Take 2 tablets by mouth daily.   Yes [provider]  omeprazole (PRILOSEC) 20 MG capsule Take 1 capsule (20 mg total) by mouth  daily. 09/03/20 09/03/21 Yes Eloise Harman, DO  praziquantel (BILTRICIDE) 600 MG tablet Take both tablets by mouth once with a meal 12/15/20  Yes Idol, Almyra Free, PA-C    Allergies    Patient has no known allergies.  Review of Systems   Review of Systems  Constitutional: Positive for unexpected weight change. Negative for chills and fever.  HENT: Negative for congestion and sore throat.   Eyes: Negative.   Respiratory: Negative for chest tightness and shortness of breath.   Cardiovascular: Negative for chest pain.  Gastrointestinal: Negative for abdominal pain and nausea.       Negative except as mentioned in HPI.   Genitourinary: Negative.   Musculoskeletal: Negative for arthralgias, joint swelling and neck pain.  Skin: Negative.  Negative for rash and wound.  Neurological: Negative for dizziness, weakness, light-headedness, numbness and headaches.  Psychiatric/Behavioral: Negative.   All other systems reviewed and are negative.   Physical Exam Updated Vital Signs BP 109/69   Pulse 60   Temp 98.7 F (37.1 C) (Oral)   Resp 18   Ht 5\' 11"  (1.803 m)   Wt 66.5 kg   SpO2 100%   BMI 20.43 kg/m   Physical Exam Vitals and nursing note reviewed.  Constitutional:      Appearance: He is well-developed.  HENT:     Head: Normocephalic and atraumatic.  Eyes:     Conjunctiva/sclera: Conjunctivae normal.  Cardiovascular:     Rate and Rhythm: Normal rate and regular rhythm.     Heart sounds: Normal heart sounds.  Pulmonary:     Effort: Pulmonary effort is normal.     Breath sounds: Normal breath sounds. No wheezing.  Abdominal:     General: Bowel sounds are normal. There is no distension.     Palpations: Abdomen is soft.     Tenderness: There is no abdominal tenderness. There is no guarding or rebound.  Musculoskeletal:        General: Normal range of motion.     Cervical back: Normal range of motion.  Skin:    General: Skin is warm and dry.  Neurological:     Mental  Status: He is alert.     ED Results / Procedures / Treatments   Labs (all labs ordered are listed, but only abnormal results are displayed) Labs Reviewed  URINALYSIS, ROUTINE W REFLEX MICROSCOPIC - Abnormal; Notable for the following components:      Result Value   Color, Urine STRAW (*)    Specific Gravity, Urine 1.003 (*)    All other components within normal limits  CBC WITH DIFFERENTIAL/PLATELET  COMPREHENSIVE METABOLIC PANEL  LIPASE, BLOOD    EKG None  Radiology DG Abdomen Acute W/Chest  Result Date: 12/15/2020 CLINICAL DATA:  Abdominal pain EXAM: DG ABDOMEN ACUTE WITH 1 VIEW CHEST COMPARISON:  Chest radiograph May 31, 2019; CT abdomen and pelvis April 16, 2020 FINDINGS: PA chest: Lungs are clear. Heart size and pulmonary vascularity are normal. No adenopathy. Supine and upright abdomen: Moderate stool present in colon. There is no bowel dilatation or air-fluid level to suggest bowel obstruction. No appreciable free air. No abnormal calcifications. IMPRESSION: Moderate stool in colon. No bowel obstruction or free air. Lungs clear. Electronically Signed   By: Lowella Grip III M.D.   On: 12/15/2020 15:30    Procedures Procedures   Medications Ordered in ED Medications - No data to display  ED Course  I have reviewed the triage vital signs and the nursing notes.  Pertinent labs & imaging results that were available during my care of the patient were reviewed by me and considered in my medical decision making (see chart for details).    MDM Rules/Calculators/A&P                          Pt with reported continued suspected woms identified in his stool with known potential exposure to tapeworms.  He was unable to produce a stool sample here, stool studies ordered - pt to return to our lab asap.  Advised to continue with plan to see GI.  In the interim, will cover him with praziquantel specific for tapeworm.   The patient appears reasonably screened and/or stabilized  for discharge and I doubt any other medical condition or other [facility] requiring further screening, evaluation, or treatment in the ED at this time prior to discharge.  Final Clinical Impression(s) / ED Diagnoses Final diagnoses:  Intestinal worms    Rx / DC Orders ED Discharge Orders         Ordered    praziquantel (BILTRICIDE) 600 MG tablet        12/15/20 1722           Evalee Jefferson, PA-C 12/17/20 1247    Horton, Alvin Critchley, DO 12/24/20 1513

## 2020-12-15 NOTE — ED Triage Notes (Addendum)
Pt to the ED with abdominal pain for the past 2 wks.   Pt has been diagnosed with an intestinal parasite (thread worms) and has a referral to GI.

## 2020-12-15 NOTE — Discharge Instructions (Addendum)
Your history suggests a possible tapeworm infection which should be treated with the prescription given.  If you are able to provide a stool sample in the container given, please return this to our lab (ideally collect the sample before you take this medicine).  Plan to see the GI specialist you are being referred to by your primary MD.

## 2020-12-15 NOTE — ED Notes (Signed)
Pt unable to give stool sample at this time.

## 2020-12-17 ENCOUNTER — Telehealth: Payer: Self-pay

## 2020-12-17 NOTE — Telephone Encounter (Signed)
noted 

## 2020-12-17 NOTE — Telephone Encounter (Signed)
This pt was seen in the ED over the weekend. Pt will need a Rx sent to [facility] for Biltricide 600 mg I order to get a PA started for this pt.

## 2020-12-17 NOTE — Telephone Encounter (Signed)
I spoke with Logan Benton about this pt and Logan Benton sent message to Logan Benton about this pt. Pt needs to be seen and Logan Benton also spoke with the pt and informed him that without his stool results, we will not be able to treat him for any parasites. And he is aware that he may get referred to ID. He is very uncomfortable and would like to be examined and see if there is anything that can help his symptoms he is having. He will be at his appointment tomorrow.

## 2020-12-17 NOTE — Telephone Encounter (Signed)
I have not seen this patient since 06/2020.   Patient seen in ED 12/15/20, ED note not available but instructions provided to patient were to take stated medication, return stool specimen, and see PCP asap.   He saw his PCP on 12/13/20 for same concerns.   I am unable to write him for this medication without him being seen and having documented positive stool. We have never seen him for this problem.   I would suggest he call ED regarding medication coverage issues since they wrote the RX or he can follow up with his PCP who saw him for same last week.

## 2020-12-17 NOTE — Telephone Encounter (Signed)
I'm not sure that we need to see him as his PCP is already addressing the parasite issue. Patient just submitted stool specimen to PCP and very unlikely results will be available before his appt tomorrow.   I would advise wait for results of his stool testing and if negative, then we can address any GI concerns. Otherwise PCP can treat for parasites.

## 2020-12-17 NOTE — Telephone Encounter (Signed)
Spoke with Almyra Free about this pt and was advised to bring in for a visit. First available is tomorrow (12/18/2020) with Neil Crouch @ 9:30 am. Pt has been made aware of this appt and he was able to get a stool sample to his PCP today and hopefully results will be in for the visit.

## 2020-12-17 NOTE — Telephone Encounter (Signed)
Pt spent his day at [facility] hospital Saturday. Pt was given a medication Biltricide 600 mg to treat tapeworms. Per pt the ED wants him to take this medication ASAP and the medication requires a PA. Pt states that he got the tapeworms from his cat. Pt states that he is willing to pay the cost after the PA is completed. Pt's pharmacy is [place], [place].

## 2020-12-18 ENCOUNTER — Other Ambulatory Visit: Payer: Self-pay

## 2020-12-18 ENCOUNTER — Ambulatory Visit: Payer: 59 | Admitting: Gastroenterology

## 2020-12-18 ENCOUNTER — Encounter: Payer: Self-pay | Admitting: Gastroenterology

## 2020-12-18 VITALS — BP 143/72 | HR 69 | Temp 97.7°F | Ht 71.0 in | Wt 148.2 lb

## 2020-12-18 DIAGNOSIS — R1013 Epigastric pain: Secondary | ICD-10-CM

## 2020-12-18 DIAGNOSIS — R634 Abnormal weight loss: Secondary | ICD-10-CM | POA: Diagnosis not present

## 2020-12-18 DIAGNOSIS — R112 Nausea with vomiting, unspecified: Secondary | ICD-10-CM

## 2020-12-18 DIAGNOSIS — B839 Helminthiasis, unspecified: Secondary | ICD-10-CM

## 2020-12-18 NOTE — Progress Notes (Signed)
Primary Care Physician: Leonie Douglas, MD  Primary Gastroenterologist:  Elon Alas. Abbey Chatters, DO   Chief Complaint  Patient presents with  . Abdominal Pain    Epigastric region, lots of pressure, feels like a weight is sitting there all the time  . Diarrhea    2-3 times per day    HPI: Logan Benton is a 40 y.o. male here at the request of Dr. Leonie Douglas for further evaluation of ?tapeworm or roundworm infection.  Symptoms started back in 10/2020 with anal itching that was worse at night. He did tape test on himself and reports seeing worms. He thought he might have pinworms. Over the course of past two months, his PCP has had him alternate Pyrantel and Nabendazole. Patient states that he has continued to see worms in his stool, describing initially as white fragments, then 1/2 inch long white worms at least 20 in each stool on a daily basis. Usually more in morning stools. Over the past two weeks, seeing larger "segments" of worms measuring two inches long. He has started having increased GI symptoms. Complains of abdominal pain, associated with vomiting. Has had some hematemesis. States the back of his throat is irritated. No melena, brbpr. Abdominal pain worse with meals. Will last a couple of hours. Eating bland foods with no processed sugars. Has lost 10-12 pounds, well documented. He has intense itching over the abdomen that wakes him up from sleep. No visible rash. Complains of significant fatigue. BMs 2-3 daily, [place] 7. No solid stool in over a month. Patient states he bags his clothes after work and washes immediately. He washes his bed sheets daily.    Patient seen in ED 3/12. Given a prescription for praziquantel to cover for tapeworm but required a prior authorization. Patient reports his PCP did not feel comfortable prescribing it and requested he follow up with [place]. Labs done in ED as outlined below. Stool specimen submitted yesterday to his PCP but those results  are not available. Patient states there were visible worms in the stool.    Patient states he took in a stray cat 08/2020. Treated for tapeworm, confirmed in 09/2020. Now on preventative course. Recheck last month and cat clear of worms.      EGD November 2021 Small hiatal hernia. - Esophageal mucosal changes suggestive of short-segment Barrett's esophagus. Biopsied with intestinal metaplasia consistent with Barrett's esophagus. - Normal stomach. - Normal duodenal bulb, first portion of the duodenum and second portion of the Duodenum. -Repeat EGD in 3 years  EGD September 2021 LA Grade C esophagitis with no bleeding. - Z-line irregular, 40 cm from the incisors. - Gastritis. Biopsied with mild chronic gastritis. - Normal duodenal bulb, first portion of the duodenum and second portion of the duodenum. Biopsied with benign mucosa, no villous blunting.  Colonoscopy September 2021 -4 mm polyp was found in the sigmoid colon. The polyp was sessile. The polyp was removed with a cold snare. -The exam was otherwise without abnormality. -Non-bleeding internal hemorrhoids -Repeat colonoscopy in 7 years  Limited ultrasound July 2021: Incidental note of a 3 mm gallbladder polyp, otherwise unremarkable study.  CT abdomen pelvis with contrast July 2021: Findings suggestive of mild proctocolitis.  Current Outpatient Medications  Medication Sig Dispense Refill  . acetaminophen (TYLENOL) 500 MG tablet Take 500-1,000 mg by mouth every 6 (six) hours as needed (for pain.).    Marland Kitchen Ascorbic Acid (VITAMIN C GUMMIE PO) Take 3 tablets by mouth daily.    Marland Kitchen  baclofen (LIORESAL) 10 MG tablet Take 10 mg by mouth 2 (two) times daily.     Marland Kitchen EMVERM 100 MG chewable tablet Chew 100 mg by mouth. Every 2 weeks    . lisinopril (ZESTRIL) 5 MG tablet Take 5 mg by mouth at bedtime.    . Multiple Vitamins-Minerals (ADULT ONE DAILY GUMMIES PO) Take 2 tablets by mouth daily.    Marland Kitchen omeprazole (PRILOSEC) 20 MG capsule Take 1  capsule (20 mg total) by mouth daily. 30 capsule 11  . pyrantel pamoate 50 MG/ML SUSP Take by mouth. Alternates every 2 weeks     No current facility-administered medications for this visit.    Allergies as of 12/18/2020  . (No Known Allergies)    ROS:  General: Negative for anorexia,  fever, chills, weakness.see hpi ENT: Negative for hoarseness, difficulty swallowing , nasal congestion. CV: Negative for chest pain, angina, palpitations, dyspnea on exertion, peripheral edema.  Respiratory: Negative for dyspnea at rest, dyspnea on exertion, cough, sputum, wheezing.  GI: See history of present illness. GU:  Negative for dysuria, hematuria, urinary incontinence, urinary frequency, nocturnal urination.  Endo: see hpi  Physical Examination:   BP (!) 143/72   Pulse 69   Temp 97.7 F (36.5 C)   Ht 5\' 11"  (1.803 m)   Wt 148 lb 3.2 oz (67.2 kg)   BMI 20.67 kg/m   General: Well-nourished, well-developed in no acute distress.  Eyes: No icterus. Mouth: Oropharyngeal mucosa moist and pink , no lesions erythema or exudate. Lungs: Clear to auscultation bilaterally.  Heart: Regular rate and rhythm, no murmurs rubs or gallops.  Abdomen: Bowel sounds are normal, nontender, nondistended, no hepatosplenomegaly or masses, no abdominal bruits or hernia , no rebound or guarding.   Extremities: No lower extremity edema. No clubbing or deformities. Neuro: Alert and oriented x 4   Skin: Warm and dry, no jaundice.   Psych: Alert and cooperative, normal mood and affect.  Labs:  Lab Results  Component Value Date   CREATININE 0.95 12/15/2020   BUN 15 12/15/2020   NA 135 12/15/2020   K 3.9 12/15/2020   CL 101 12/15/2020   CO2 26 12/15/2020   Lab Results  Component Value Date   ALT 36 12/15/2020   AST 27 12/15/2020   ALKPHOS 41 12/15/2020   BILITOT 0.9 12/15/2020   Lab Results  Component Value Date   WBC 6.1 12/15/2020   HGB 14.9 12/15/2020   HCT 44.9 12/15/2020   MCV 94.7 12/15/2020    PLT 251 12/15/2020   Lab Results  Component Value Date   LIPASE 23 12/15/2020     Imaging Studies: DG Abdomen Acute W/Chest  Result Date: 12/15/2020 CLINICAL DATA:  Abdominal pain EXAM: DG ABDOMEN ACUTE WITH 1 VIEW CHEST COMPARISON:  Chest radiograph May 31, 2019; CT abdomen and pelvis April 16, 2020 FINDINGS: PA chest: Lungs are clear. Heart size and pulmonary vascularity are normal. No adenopathy. Supine and upright abdomen: Moderate stool present in colon. There is no bowel dilatation or air-fluid level to suggest bowel obstruction. No appreciable free air. No abnormal calcifications. IMPRESSION: Moderate stool in colon. No bowel obstruction or free air. Lungs clear. Electronically Signed   By: Lowella Grip III M.D.   On: 12/15/2020 15:30    Assessment/Plan:  40 y/o male presenting with 52-month history of progressive abdominal pain associated with intermittent vomiting, diarrhea, 10 to 12 pound weight loss.  Patient reports seeing worm segments in his stools for the past 2 months.  Treated by PCP over that period of time with alternating mebendazole and pyrantel, still on medication. Patient states his symptoms have only gotten worse. Patient reports took in a stray cat back in November, treated for tapeworm in December. He submitted for stool specimen yesterday, results pending.  1. Will await stool results. Treatment based on results.  2. Offered supportive care with antiemetics, patient is not interested.  3. He will call with any worsening symptoms in the interim.

## 2020-12-18 NOTE — Telephone Encounter (Signed)
noted 

## 2020-12-18 NOTE — Patient Instructions (Signed)
We will contact you when your stool results are available. Please call Thursday if you have not heard from [place].

## 2020-12-19 ENCOUNTER — Emergency Department (HOSPITAL_COMMUNITY): Payer: 59

## 2020-12-19 ENCOUNTER — Emergency Department (HOSPITAL_COMMUNITY)
Admission: EM | Admit: 2020-12-19 | Discharge: 2020-12-19 | Disposition: A | Discharge status: Home / Self Care | Payer: 59 | Attending: Emergency Medicine | Admitting: Emergency Medicine

## 2020-12-19 ENCOUNTER — Encounter (HOSPITAL_COMMUNITY): Payer: Self-pay

## 2020-12-19 ENCOUNTER — Other Ambulatory Visit: Payer: Self-pay

## 2020-12-19 DIAGNOSIS — R0602 Shortness of breath: Secondary | ICD-10-CM | POA: Insufficient documentation

## 2020-12-19 DIAGNOSIS — R0789 Other chest pain: Secondary | ICD-10-CM | POA: Diagnosis not present

## 2020-12-19 DIAGNOSIS — M79605 Pain in left leg: Secondary | ICD-10-CM | POA: Insufficient documentation

## 2020-12-19 DIAGNOSIS — Z5321 Procedure and treatment not carried out due to patient leaving prior to being seen by health care provider: Secondary | ICD-10-CM | POA: Diagnosis not present

## 2020-12-19 NOTE — ED Triage Notes (Signed)
Pt to er, pt states that he is here for some chest pain, states that his pain started about two hours ago when he was getting his hair cut, states that the pain seem to come and go, states that there is always a pressure, but the pain randomly gets better and worse, pt states that he is also having some L leg pain and some shortness of breath. Pt talking in full sentences, skin pink, warm and dry.

## 2020-12-20 ENCOUNTER — Telehealth: Payer: Self-pay | Admitting: Internal Medicine

## 2020-12-20 NOTE — Telephone Encounter (Signed)
Returned pt's call and was advised that his PCP in [place] has not contacted him regarding his results yet. I asked the pt who is Logan Benton and he said its the Dr he see's in [place].

## 2020-12-20 NOTE — Telephone Encounter (Signed)
His PCP is Dr. Marlon Pel at [facility].   Please request stool study results from them. They may not be available yet. We have not received anything from them yet.

## 2020-12-20 NOTE — Telephone Encounter (Signed)
patient was supposed to let leslie know if he got his stool sample results from [facility] and he did not

## 2020-12-21 NOTE — Telephone Encounter (Signed)
Noted. Logan Benton please request a copy of stool results from PCP.

## 2020-12-21 NOTE — Telephone Encounter (Signed)
Spoke with the pt and he is checking his portal every 2 hours and he hasn't seen any results yet. I advised the pt I will call to see if PCP has it

## 2020-12-21 NOTE — Telephone Encounter (Signed)
Requested from [facility] (PCP)

## 2020-12-25 NOTE — Telephone Encounter (Signed)
Phoned the pt was advised he got his results and they were negative. So I advised him we are still waiting on his office to send results to [place].

## 2020-12-25 NOTE — Telephone Encounter (Signed)
I have requested again ASAP

## 2020-12-25 NOTE — Telephone Encounter (Signed)
I still have not seen any stool results on this guy.   Please let pt know we are still waiting on results.   Please contact PCP and ask for results again.

## 2020-12-27 NOTE — Telephone Encounter (Signed)
Please let pt know that we received his stool results. No ova or parasites seen.   It is important to know that sometimes it could take up to 3 stool specimens to obtain positive results. Patient stated that he could see worm segments in the specimen that he submitted. We can offer the following. HIGHLY RECOMMEND NUMBER 1,2,3.  1. Submit additional stool and worm segments for testing at [facility].  I could not enter test via epic.  Would need TWO additional stool specimens, collected 2 to 3 days apart sent for test #3950 (ova and parasites examination). 2. Would recommend GI profile at [facility] (PCR testing for multiple GI pathogens).  3. Patient could retrieve several worm segments from stool and submit for test #8219 (parasite identification, worm).  He would need to discuss with [facility] the appropriate collection container to submit and he went to pick up stool container. 4. In addition, any of the following could interfere with testing: bismuth, barium (wait 7-10 days), antimicrobial agents (wait 2 weeks), gallbladder dye (wait 3 weeks after procedure). 5. If he is not interested in submitting stool specimen, we could refer to infectious diseases for further management. 6. If he is not interested in submitting stool specimen or infectious diseases, would offer CT abdomen pelvis with contrast to further evaluate abdominal pain, vomiting, diarrhea, weight loss.

## 2020-12-28 NOTE — Telephone Encounter (Signed)
Phoned and spoke with the pt, got down to #5 and he said he had to call me back later or Monday because he was at a Dr's appt.

## 2020-12-28 NOTE — Telephone Encounter (Signed)
Noted and will see Monday what decision the pt has made.

## 2020-12-28 NOTE — Telephone Encounter (Signed)
Noted. Pt said that he was given nystatin oral suspension x 4 times daily and diflucan.

## 2020-12-28 NOTE — Telephone Encounter (Signed)
noted 

## 2020-12-28 NOTE — Telephone Encounter (Signed)
Noted. Updated kristin.

## 2020-12-28 NOTE — Telephone Encounter (Signed)
Did urgent care address his "yeast" problem? He did not have any signs or symptoms of yeast issues when I saw him 12/18/20, at that time worried about having worms.   He can keep ov Monday. Nothing that needs to be acutely addressed today.   Currently scheduled with Kristen for Monday. I will bring her up to date on pending issues.

## 2020-12-28 NOTE — Telephone Encounter (Addendum)
Pt called back and requested an appt.  He said he went to Urgent Care and they told him that he had yeast in his mouth, esophagus, and probably his stomach.  Scheduled ov for Monday.  Informed pt to have Urgent Care send [place] the ov note since this was a new problem. Spoke to Wilhoit and she informed me to see if we should address information below at his ov Monday or if he needs to come in.  Routing to Neil Crouch, [place].

## 2020-12-29 NOTE — Progress Notes (Signed)
Referring Provider: Leonie Douglas, MD Primary Care Physician:  Leonie Douglas, MD Primary GI Physician: Dr. Abbey Chatters  Chief Complaint  Patient presents with  . Follow-up    Had reaction to nystatin. Wants to discuss the yeast infection. Took the diflucan but still having some lingering symtpoms    HPI:   Logan Benton is a 40 y.o. male presenting today for follow up of abdominal pain, nausea with vomiting, weight loss, concern for parasitic infection, and yeast infection.   Patient was seen in our office 12/18/2020 for epigastric abdominal pain, nausea with vomiting, weight loss, and reported worms in his stool.  Symptoms started back in January 2022 when he thought he had pinworms.  PCP treated him with mebendazole and pyrantel, still on medication.  He reported seeing larger segments of worms over the last couple weeks and had increasing GI symptoms including abdominal pain, associated vomiting and some hematemesis.  No BRBPR or melena.  Pain worsened with meals lasting a  couple of hours.  He was eating bland foods with no processed sugars.  Lost 10 to 12 pounds.  Intense itching over the abdomen waking him from sleep.  Significant fatigue.  2-3 Bristol 7 stools daily.  He had a submitted a stool sample with PCP yesterday with results pending. Prior labs in the ED on 3/12 with CBC, CMP, Lipase wnl. Plan to await stool results.  Offered antiemetic, but patient declined.   Received stool results from PCP.  No ova or parasites seen.  Offered several options for patient including resubmitting 2 more stool samples, submitting parasite specifically for identification, completing GI profile, referring to ID, or proceeding with CT.  Patient called requesting an appointment stating he went to Urgent Care and they told him that he had yeast in his mouth, esophagus, and probably his stomach. He was prescribed diflucan and nystatin oral suspension QID by urgent care. He was scheduled to be seen  today.   Evaluated in the emergency room at [facility] 3/25 for diffuse body pains extending into his legs.  He reported being on multiple courses of worm medications for intestinal worms.  Had also been on a Z-Pak and a course of doxycycline "just in case".  Took fluconazole the night before and was on a nystatin suspension due to possible yeast infection throughout his body.  Also taking baclofen for sciatic and leg pain.  He had experienced some weight loss possibly secondary to worms, but was now back up to 154 pounds.  Labs remarkable for TSH 0.506 (L), CBC within normal limits, CMP remarkable for sodium 132 (L), calcium 8.2 (L).  UDS positive for cannabis.  Chest x-ray was negative.  He was deemed stable to discharge.   Today:  He was evaluated by urgent care last week and was told he had thrush.  He took Diflucan 200 mg x 1 on Thursday.  Also been prescribed nystatin but states this gave him hives.  Provider at urgent care than prescribed 1 additional dose of Diflucan which she took on Saturday.  Overall, feels thrush is improving.  Prior to Diflucan, he was having pain in his throat as well as pain with swallowing.  Since taking Diflucan, this has improved but he still has mild discomfort/scratchy sensation in his throat.  Also with a little white discoloration on his tongue. Denies dysphagia or GERD symptoms on omeprazole 20 mg daily.  Reports he had been empirically treated with doxycycline a couple weeks ago for an STD though  STD testing was negative.  Reports he has been having a lot of diarrhea with yellow discharge and itching around his rectum.  Today is the first day he had a semisolid stool in a couple of months.  When having diarrhea, he would have 1-2 Bristol 7 stools.  No urgency.  This morning, he had Bristol 5 stool.  He is no longer seeing worms in his stool.  States he may not have actually been seeing worms the last few weeks.  Thinks he was making himself paranoid and googling  all of his symptoms.  No blood in the stool or black stools.   Upper abdominal cramping has essentially resolved.  It was occurring after eating.  Now, he may have a slight twinge once or twice a day, but nothing significant.  Nausea/vomiting resolved.  Denies NSAIDs.  Still following a low/no sugar diet.   Last dose of prantel about 2 weeks ago.  Upon further discussion today, patient notes that his constellation of GI symptoms including diarrhea, abdominal pain, nausea, vomiting seem to start after starting antiparasitic medications.  Past Medical History:  Diagnosis Date  . Abscessed tooth   . Anxiety   . C. difficile diarrhea    presumptive diagnosis  . Depression   . Hypertension   . Trigger finger     Past Surgical History:  Procedure Laterality Date  . BILATERAL CARPAL TUNNEL RELEASE    . BIOPSY  06/29/2020   Procedure: BIOPSY;  Surgeon: Eloise Harman, DO;  Location: [facility];  Service: Endoscopy;;  . BIOPSY  09/03/2020   Procedure: BIOPSY;  Surgeon: Eloise Harman, DO;  Location: [facility];  Service: Endoscopy;;  . COLONOSCOPY WITH PROPOFOL N/A 06/29/2020   Dr. Abbey Chatters: 4 mm tubular adenoma removed, nonbleeding internal hemorrhoids.  Repeat colonoscopy in 7 years.  . ESOPHAGOGASTRODUODENOSCOPY (EGD) WITH PROPOFOL N/A 06/29/2020   Dr. Abbey Chatters: LA grade C esophagitis, gastritis with mild chronic gastritis on path, small bowel biopsy unremarkable.  . ESOPHAGOGASTRODUODENOSCOPY (EGD) WITH PROPOFOL N/A 09/03/2020   Dr. Abbey Chatters: Esophageal mucosal changes suggestive of short segment Barrett's esophagus, biopsies consistent.  Next EGD in 3 years  . POLYPECTOMY  06/29/2020   Procedure: POLYPECTOMY;  Surgeon: Eloise Harman, DO;  Location: [facility];  Service: Endoscopy;;  . SHOULDER SURGERY Right    x2  . TRIGGER FINGER RELEASE Right 2016    Current Outpatient Medications  Medication Sig Dispense Refill  . acetaminophen (TYLENOL) 500 MG tablet Take 500-1,000  mg by mouth every 6 (six) hours as needed (for pain.).    Marland Kitchen Ascorbic Acid (VITAMIN C GUMMIE PO) Take 3 tablets by mouth daily.    . baclofen (LIORESAL) 10 MG tablet Take 10 mg by mouth 2 (two) times daily.     Marland Kitchen EMVERM 100 MG chewable tablet Chew 100 mg by mouth. Every 2 weeks    . lisinopril (ZESTRIL) 5 MG tablet Take 5 mg by mouth at bedtime.    . Multiple Vitamins-Minerals (ADULT ONE DAILY GUMMIES PO) Take 2 tablets by mouth daily.    Marland Kitchen omeprazole (PRILOSEC) 20 MG capsule Take 1 capsule (20 mg total) by mouth daily. 30 capsule 11  . Probiotic Product (PROBIOTIC-10 PO) Take by mouth in the morning and at bedtime.    . pyrantel pamoate 50 MG/ML SUSP Take by mouth. Alternates every 2 weeks     No current facility-administered medications for this visit.    Allergies as of 12/31/2020  . (No Known  Allergies)    Family History  Problem Relation Age of Onset  . Rheum arthritis Mother   . Bladder Cancer Mother   . Kidney disease Father   . Bladder Cancer Maternal Grandfather   . Irritable bowel syndrome Paternal Grandmother   . Colon cancer Neg Hx   . Celiac disease Neg Hx     Social History   Socioeconomic History  . Marital status: Divorced    Spouse name: Not on file  . Number of children: Not on file  . Years of education: Not on file  . Highest education level: Not on file  Occupational History  . Not on file  Tobacco Use  . Smoking status: Former Smoker    Packs/day: 0.00    Years: 0.00    Pack years: 0.00    Quit date: 11/03/2019    Years since quitting: 1.1  . Smokeless tobacco: Never Used  . Tobacco comment: nicoten gum  Vaping Use  . Vaping Use: Former  . Quit date: 03/07/2019  Substance and Sexual Activity  . Alcohol use: No  . Drug use: Not Currently  . Sexual activity: Not on file  Other Topics Concern  . Not on file  Social History Narrative  . Not on file   Social Determinants of Health   Financial Resource Strain: Not on file  Food Insecurity: Not  on file  Transportation Needs: Not on file  Physical Activity: Not on file  Stress: Not on file  Social Connections: Not on file    Review of Systems: Gen: Denies fever, chills, cold or flulike symptoms, presyncope, syncope. CV: Denies chest pain or palpitations. Resp: Denies dyspnea or cough. GI: See HPI Derm: See HPI Heme: See HPI  Physical Exam: BP 135/79   Pulse 61   Temp (!) 97.1 F (36.2 C)   Ht 5\' 11"  (1.803 m)   Wt 148 lb 3.2 oz (67.2 kg)   BMI 20.67 kg/m  General:   Alert and oriented. No distress noted. Pleasant and cooperative.  Head:  Normocephalic and atraumatic. Eyes:  Conjuctiva clear without scleral icterus. Mouth:  Oral mucosa pink and moist. Slight white coating on back of tongue. No white lesions on buccal mucosa or posterior pharynx.  Heart:  S1, S2 present without murmurs appreciated. Lungs:  Clear to auscultation bilaterally. No wheezes, rales, or rhonchi. No distress.  Abdomen:  +BS, soft, non-tender and non-distended. No rebound or guarding. No HSM or masses noted. Msk:  Symmetrical without gross deformities. Normal posture. Skin: No rash.  Extremities:  Without edema. Neurologic:  Alert and  oriented x4 Psych: Normal mood and affect.   Assessment: 40 year old male presenting today for follow-up of multiple GI symptoms including epigastric abdominal pain, nausea with vomiting, diarrhea, and weight loss which was all in the setting of reported intestinal parasite infection though stool testing was negative with PCP.  Initially thought he may have pinworms and then later thought he may have a tapeworm from a stray cat.  He had reported seeing multiple segments of worms in his stools. PCP had treated him empirically with mebendazole and pyrantel. Last dose of pyrantel was about 2 weeks ago. Recent evaluated by urgent care and was told he had yeast in his mouth and esophagus s/p Diflucan 200 mg x 1 on 3/24 and again on 3/26.  He had also been prescribed  nystatin, but reports breaking out in hives when taking nystatin.  Prior to Diflucan, he was having pain and burning in his  throat as well as pain with swallowing.  Since taking Diflucan, he has actually noticed improvement in all of his GI symptoms.  Nausea and vomiting have resolved, appetite is much improved and reports he is gaining some weight back though according to our scales, his weight is stable. Upper abdominal pain significantly improved with only mild "twinges" a couple times a day.  Diarrhea is also improving.  States stools actually had some form this morning which is the first time in a couple of months.  He is also no longer seeing worms in his stools. Oral thrush has improved, but still with some white on the back of his tongue and scratchy throat though odynophagia has resolved. Denies dysphagia or GERD on omeprazole 20 mg daily.  Suspect patient may very well have had a parasitic infection.  After further discussion with patient today, it seems that most of his GI symptoms started after antiparasitic medications. Notably, common side effects to mebendazole and pyrantel include nausea, vomiting, diarrhea, and abdominal pain. Suspect medication effect was likely the primary driver of most of his GI symptoms. As he still has some lingering oropharyngeal candidal symptoms, I will prescribed a 7 day course of diflucan. Otherwise, we will hold off on any further evaluation as he has had significant improvement. Plan to follow-up in 4-6 weeks.   Plan: 1.  Diflucan 200 mg daily x7 days. 2.  Continue omeprazole 20 mg daily. 3.  Monitor for any persistent epigastric abdominal pain or diarrhea and let us know if this occurs. 4.  Follow-up in 4 to 6 weeks.    Aliene Altes, PA-C [facility] Gastroenterology 12/31/2020

## 2020-12-31 ENCOUNTER — Encounter: Payer: Self-pay | Admitting: Gastroenterology

## 2020-12-31 ENCOUNTER — Ambulatory Visit: Payer: 59 | Admitting: Gastroenterology

## 2020-12-31 ENCOUNTER — Other Ambulatory Visit: Payer: Self-pay

## 2020-12-31 VITALS — BP 135/79 | HR 61 | Temp 97.1°F | Ht 71.0 in | Wt 148.2 lb

## 2020-12-31 DIAGNOSIS — B839 Helminthiasis, unspecified: Secondary | ICD-10-CM

## 2020-12-31 DIAGNOSIS — R1013 Epigastric pain: Secondary | ICD-10-CM | POA: Diagnosis not present

## 2020-12-31 DIAGNOSIS — K219 Gastro-esophageal reflux disease without esophagitis: Secondary | ICD-10-CM

## 2020-12-31 DIAGNOSIS — R634 Abnormal weight loss: Secondary | ICD-10-CM

## 2020-12-31 DIAGNOSIS — R197 Diarrhea, unspecified: Secondary | ICD-10-CM

## 2020-12-31 DIAGNOSIS — K2270 Barrett's esophagus without dysplasia: Secondary | ICD-10-CM | POA: Insufficient documentation

## 2020-12-31 DIAGNOSIS — B370 Candidal stomatitis: Secondary | ICD-10-CM | POA: Diagnosis not present

## 2020-12-31 MED ORDER — FLUCONAZOLE 100 MG PO TABS: 200.0000 mg | ORAL_TABLET | Freq: Every day | ORAL | 0 refills | Status: DC

## 2020-12-31 NOTE — Telephone Encounter (Signed)
noted 

## 2020-12-31 NOTE — Patient Instructions (Signed)
Take Diflucan 200 mg daily x7 days then stop.  Continue omeprazole 20 mg daily.  Continue to monitor abdominal pain and loose bowel movements and let us know if this persist.  We will plan to follow-up with you in 4-6 weeks.  Please call with questions or concerns prior.   Aliene Altes, PA-C [facility] Gastroenterology

## 2021-01-22 ENCOUNTER — Telehealth: Payer: Self-pay | Admitting: Internal Medicine

## 2021-01-22 NOTE — Telephone Encounter (Signed)
Pt said he called this morning and was following up to see if a nurse or PA was available to speak with him. I told him there was a phone note and the nurse was on another line. He said it was urgent that he spoke to someone today before we close. [phone]

## 2021-01-22 NOTE — Telephone Encounter (Signed)
[phone]  Please call patient, he is having discomfort in his abdomen and wants to talk to a nurse,

## 2021-01-23 ENCOUNTER — Other Ambulatory Visit: Payer: Self-pay | Admitting: Gastroenterology

## 2021-01-23 ENCOUNTER — Telehealth: Payer: Self-pay | Admitting: Gastroenterology

## 2021-01-23 DIAGNOSIS — R197 Diarrhea, unspecified: Secondary | ICD-10-CM

## 2021-01-23 DIAGNOSIS — B89 Unspecified parasitic disease: Secondary | ICD-10-CM

## 2021-01-23 DIAGNOSIS — B839 Helminthiasis, unspecified: Secondary | ICD-10-CM

## 2021-01-23 NOTE — Addendum Note (Signed)
Addended by: Hassan Rowan on: 01/23/2021 01:02 PM   Modules accepted: Orders

## 2021-01-23 NOTE — Telephone Encounter (Signed)
Urgent referral sent to infectious disease via Epic.

## 2021-01-23 NOTE — Telephone Encounter (Signed)
I have been talking with patient today through my chart due to concerns for recurrent parasitic infection. See separate messages. I have placed orders for stool testing with [facility]. Patient reported he picked up a stool kit and is not sure if he has everything he needs. Can we call the lab to ensure they have all the orders I have requested and that patient did receive the correct kits?  1.  He needs ova and parasite testing x3. Each test will need to be 2-3 days apart.  2.  He needs testing for parasite ID where he submits several worm segments to the lab and they identify it.  3.  Also ordered GI profile panel, PCR.

## 2021-01-23 NOTE — Telephone Encounter (Signed)
fowarding to Dena to place orders as stated per Aliene Altes PA-C

## 2021-01-23 NOTE — Telephone Encounter (Signed)
[facility] Clinical Pool: Please refer to infections disease ASAP. Persistent/recurrent parasitic infection of unknown etiology. Failing prior treatment.

## 2021-01-24 NOTE — Telephone Encounter (Signed)
Great, thanks

## 2021-01-24 NOTE — Telephone Encounter (Signed)
Spoke with Yolanda at [facility].  She confirmed lab orders.  Called local [place] [facility] as well.  The person that answered the phone said that the pt did receive the necessary items to complete the test.

## 2021-01-25 LAB — PARASITE ID, WORM

## 2021-01-27 LAB — GI PROFILE, STOOL, PCR

## 2021-01-28 ENCOUNTER — Telehealth: Payer: Self-pay | Admitting: *Deleted

## 2021-01-28 ENCOUNTER — Other Ambulatory Visit: Payer: Self-pay

## 2021-01-28 DIAGNOSIS — B89 Unspecified parasitic disease: Secondary | ICD-10-CM

## 2021-01-28 NOTE — Telephone Encounter (Signed)
Noted. Will address under result note. 

## 2021-01-28 NOTE — Telephone Encounter (Signed)
Received fax from [facility] for GI Profile, stool, PCR "Test not performed. Specimen received over filled. "

## 2021-01-29 LAB — OVA AND PARASITE EXAMINATION

## 2021-02-05 ENCOUNTER — Ambulatory Visit (INDEPENDENT_AMBULATORY_CARE_PROVIDER_SITE_OTHER): Payer: 59 | Admitting: Internal Medicine

## 2021-02-05 ENCOUNTER — Encounter: Payer: Self-pay | Admitting: Internal Medicine

## 2021-02-05 ENCOUNTER — Other Ambulatory Visit: Payer: Self-pay

## 2021-02-05 VITALS — BP 146/78 | HR 65 | Temp 97.5°F | Wt 149.0 lb

## 2021-02-05 DIAGNOSIS — B839 Helminthiasis, unspecified: Secondary | ICD-10-CM | POA: Diagnosis not present

## 2021-02-05 NOTE — Progress Notes (Signed)
[facility]      Reason for Consult: concern for parasitic infection    Referring Physician: Dr. Marlon Pel    Patient ID: Logan Benton, male    DOB: 21-Dec-1980, 40 y.o.   MRN: [id number]  HPI:   Here for concern for parasitic infection since January.   He states he noted perianal itching and did a home tape test and reports he found multiple pinworms on the tape.  He was given antiparasitic medictaion by his PCP x 2 and had also taken over the counter antiparasitic medications.  He states after treatment, he noted large worms in his stool and took more antiparasitic treatments he reports as mebendazole (? Albendazole) at higher doses and more OTC antiparasitic treatments.  He then began having abdominal pain, bloating, diarrhea,headache, vomiting.  He has also been concerned with 'Candida overgrowth syndrome' which he read on the internet and thinks he has continuous yeast infection of his intestines and has been getting interimittent courses of fluconazole for this.  He does report thrush as well.  He states he feels better in regards to abdominal pain, bloating while on fluconazole and he attributes this to treating the intestinal yeast.  He was referred to GI who he saw 12/18/20, 12/31/20 and has another appointment tomorrow.  Stool sample for O and P and a sample felt to be worms were submitted to micro and negative for parasites.  He attributes this to his 'deworming' treatments before the sample. He has been off treatment for 1 week.     Past Medical History:  Diagnosis Date  . Abscessed tooth   . Anxiety   . C. difficile diarrhea    presumptive diagnosis  . Depression   . Hypertension   . Trigger finger     Prior to Admission medications   Medication Sig Start Date End Date Taking? Authorizing Provider  acetaminophen (TYLENOL) 500 MG tablet Take 500-1,000 mg by mouth every 6 (six) hours as needed (for pain.).    [provider]  Ascorbic  Acid (VITAMIN C GUMMIE PO) Take 3 tablets by mouth daily.    [provider]  baclofen (LIORESAL) 10 MG tablet Take 10 mg by mouth 2 (two) times daily.  04/10/20   [provider]  fluconazole (DIFLUCAN) 100 MG tablet Take 2 tablets (200 mg total) by mouth daily. 12/31/20   Erenest Rasher, PA-C  lisinopril (ZESTRIL) 5 MG tablet Take 5 mg by mouth at bedtime. 06/19/20   [provider]  Multiple Vitamins-Minerals (ADULT ONE DAILY GUMMIES PO) Take 2 tablets by mouth daily.    [provider]  omeprazole (PRILOSEC) 20 MG capsule Take 1 capsule (20 mg total) by mouth daily. 09/03/20 09/03/21  Eloise Harman, DO  Probiotic Product (PROBIOTIC-10 PO) Take by mouth in the morning and at bedtime.    [provider]    Allergies  Allergen Reactions  . Nystatin Hives    Social History   Tobacco Use  . Smoking status: Former Smoker    Packs/day: 0.00    Years: 0.00    Pack years: 0.00    Quit date: 11/03/2019    Years since quitting: 1.2  . Smokeless tobacco: Never Used  . Tobacco comment: nicoten gum  Vaping Use  . Vaping Use: Former  . Quit date: 03/07/2019  Substance Use Topics  . Alcohol use: No  . Drug use: Not Currently    Family History  Problem Relation Age  of Onset  . Rheum arthritis Mother   . Bladder Cancer Mother   . Kidney disease Father   . Bladder Cancer Maternal Grandfather   . Irritable bowel syndrome Paternal Grandmother   . Colon cancer Neg Hx   . Celiac disease Neg Hx   . Inflammatory bowel disease Neg Hx     Review of Systems  Constitutional: positive for malaise and fatigue or negative for fevers and chills Respiratory: negative for cough or sputum Cardiovascular: positive for chest pressure/discomfort, negative for palpitations Gastrointestinal: positive for nausea, diarrhea, bloating, vomiting Musculoskeletal: negative for myalgias and arthralgias Behavioral/Psych: positive for anxiety All other systems  reviewed and are negative    Constitutional: in no apparent distress There were no vitals filed for this visit. EYES: anicteric ENMT:no thrush noted Cardiovascular: Cor RRR Respiratory: normal respiratory effort GI: soft, nt Musculoskeletal: no pedal edema noted Skin: negatives: no rash Neuro: non-focal  Labs: Lab Results  Component Value Date   WBC 6.1 12/15/2020   HGB 14.9 12/15/2020   HCT 44.9 12/15/2020   MCV 94.7 12/15/2020   PLT 251 12/15/2020    Lab Results  Component Value Date   CREATININE 0.95 12/15/2020   BUN 15 12/15/2020   NA 135 12/15/2020   K 3.9 12/15/2020   CL 101 12/15/2020   CO2 26 12/15/2020    Lab Results  Component Value Date   ALT 36 12/15/2020   AST 27 12/15/2020   ALKPHOS 41 12/15/2020   BILITOT 0.9 12/15/2020   INR 0.99 10/03/2010     Assessment: gastroentestinal distress.  I reviewed the symptoms and findings to date.  I emphasized that, in regards to parasites, there is no current evidence for parasitic infection and I cannot verify what was found previously.  He did show me stool samples that he felt contained parasitic worms in them but I was unable to distinguish this.  I am concerned that some of his symptoms are related to mulitple medications he has taken which include albendazole, OTC deworming medications, doxycycline, nystatin, fluconazole, azithromycin.  At this time he is off medications and I discussed him staying off all of these and see what symptoms and findings manifest off of treatment.  He will then get a new stool sample next week and rtc to see me in 1 week.  In regards to Candida overgrowth, I discussed that his symptoms of abdominal pain, stomach itching, not c/w Candidal infection and findings of yeast infection include thrush and dysphagia, not what he is experiencing, despite feeling better on fluconazole.   He will remain off this as well for the week and can reassess his mouth for any signs of thrush.   Continued  perianal itching.  Unlikely continued pinworms.  ? Hemorrhoid. None reported.   Plan: 1) stop treatments 2) O and P after 1 week 3) rtc in 1 week to reassess thrush, other symptoms

## 2021-02-06 ENCOUNTER — Ambulatory Visit (INDEPENDENT_AMBULATORY_CARE_PROVIDER_SITE_OTHER): Payer: 59 | Admitting: Internal Medicine

## 2021-02-06 ENCOUNTER — Encounter: Payer: Self-pay | Admitting: Internal Medicine

## 2021-02-06 VITALS — BP 120/73 | HR 59 | Temp 97.3°F | Ht 71.0 in | Wt 150.0 lb

## 2021-02-06 DIAGNOSIS — B370 Candidal stomatitis: Secondary | ICD-10-CM

## 2021-02-06 LAB — GI PROFILE, STOOL, PCR

## 2021-02-06 NOTE — Progress Notes (Signed)
Referring Provider: Leonie Douglas, MD Primary Care Physician:  Leonie Douglas, MD Primary GI:  Dr. Abbey Chatters  Chief Complaint  Patient presents with  . Follow-up    L sided pain and cramping, itching sensation in stomach    HPI:   Logan Benton is a 40 y.o. male who presents to the clinic today for follow-up visit.  Patient seen previously for epigastric pain, nausea and vomiting, weight loss, and self-reported worms in his stool.  Patient states his symptoms started back in January 2022 when he thought he had pinworm infection.  Has been treated with mebendazole and pyrantel.  Has had multiple O&P studies negative.  Recently turned in full GI panel PCR 2 days ago and we are awaiting results.  He has also been on azithromycin and course of doxycycline.  Is also been treated with fluconazole and nystatin suspension due to possible oral thrush.  States his diarrhea is improved though he continues to have yellow discharge.  He is no longer seeing worms in his stools.  He saw infectious disease yesterday who told him to stop taking all medications and recheck stool studies next week.  Past Medical History:  Diagnosis Date  . Abscessed tooth   . Anxiety   . C. difficile diarrhea    presumptive diagnosis  . Depression   . Hypertension   . Trigger finger     Past Surgical History:  Procedure Laterality Date  . BILATERAL CARPAL TUNNEL RELEASE    . BIOPSY  06/29/2020   Procedure: BIOPSY;  Surgeon: Eloise Harman, DO;  [facility];  Service: Endoscopy;;  . BIOPSY  09/03/2020   Procedure: BIOPSY;  Surgeon: Eloise Harman, DO;  Location: [facility];  Service: Endoscopy;;  . COLONOSCOPY WITH PROPOFOL N/A 06/29/2020   Dr. Abbey Chatters: 4 mm tubular adenoma removed, nonbleeding internal hemorrhoids.  Repeat colonoscopy in 7 years.  . ESOPHAGOGASTRODUODENOSCOPY (EGD) WITH PROPOFOL N/A 06/29/2020   Dr. Abbey Chatters: LA grade C esophagitis, gastritis with mild chronic gastritis on  path, small bowel biopsy unremarkable.  . ESOPHAGOGASTRODUODENOSCOPY (EGD) WITH PROPOFOL N/A 09/03/2020   Dr. Abbey Chatters: Esophageal mucosal changes suggestive of short segment Barrett's esophagus, biopsies consistent.  Next EGD in 3 years  . POLYPECTOMY  06/29/2020   Procedure: POLYPECTOMY;  Surgeon: Eloise Harman, DO;  [facility]: [facility];  Service: Endoscopy;;  . SHOULDER SURGERY Right    x2  . TRIGGER FINGER RELEASE Right 2016    Current Outpatient Medications  Medication Sig Dispense Refill  . acetaminophen (TYLENOL) 500 MG tablet Take 500-1,000 mg by mouth as needed (for pain.).    Marland Kitchen baclofen (LIORESAL) 10 MG tablet Take 10 mg by mouth 2 (two) times daily.     Marland Kitchen lisinopril (ZESTRIL) 5 MG tablet Take 5 mg by mouth at bedtime.    . Multiple Vitamins-Minerals (ADULT ONE DAILY GUMMIES PO) Take 2 tablets by mouth daily.    . Probiotic Product (PROBIOTIC-10 PO) Take by mouth in the morning and at bedtime.     No current facility-administered medications for this visit.    Allergies as of 02/06/2021  . (No Active Allergies)    Family History  Problem Relation Age of Onset  . Rheum arthritis Mother   . Bladder Cancer Mother   . Kidney disease Father   . Bladder Cancer Maternal Grandfather   . Irritable bowel syndrome Paternal Grandmother   . Colon cancer Neg Hx   . Celiac disease Neg Hx   .  Inflammatory bowel disease Neg Hx     Social History   Socioeconomic History  . Marital status: Divorced    Spouse name: Not on file  . Number of children: Not on file  . Years of education: Not on file  . Highest education level: Not on file  Occupational History  . Not on file  Tobacco Use  . Smoking status: Former Smoker    Packs/day: 0.00    Years: 0.00    Pack years: 0.00    Quit date: 11/03/2019    Years since quitting: 1.2  . Smokeless tobacco: Never Used  . Tobacco comment: nicoten gum  Vaping Use  . Vaping Use: Former  . Quit date: 03/07/2019  Substance and Sexual  Activity  . Alcohol use: No  . Drug use: Not Currently  . Sexual activity: Not on file  Other Topics Concern  . Not on file  Social History Narrative  . Not on file   Social Determinants of Health   Financial Resource Strain: Not on file  Food Insecurity: Not on file  Transportation Needs: Not on file  Physical Activity: Not on file  Stress: Not on file  Social Connections: Not on file    Subjective: Review of Systems  Constitutional: Positive for malaise/fatigue. Negative for chills and fever.  HENT: Negative for congestion and hearing loss.   Eyes: Negative for blurred vision and double vision.  Respiratory: Negative for cough and shortness of breath.   Cardiovascular: Negative for chest pain and palpitations.  Gastrointestinal: Positive for abdominal pain, diarrhea and nausea. Negative for blood in stool, constipation, heartburn, melena and vomiting.  Genitourinary: Negative for dysuria and urgency.  Musculoskeletal: Negative for joint pain and myalgias.  Skin: Negative for itching and rash.  Neurological: Negative for dizziness and headaches.  Psychiatric/Behavioral: Negative for depression. The patient is not nervous/anxious.      Objective: BP 120/73   Pulse (!) 59   Temp (!) 97.3 F (36.3 C) (Temporal)   Ht 5\' 11"  (1.803 m)   Wt 150 lb (68 kg)   BMI 20.92 kg/m  Physical Exam Constitutional:      Appearance: Normal appearance.  HENT:     Head: Normocephalic and atraumatic.  Eyes:     Extraocular Movements: Extraocular movements intact.     Conjunctiva/sclera: Conjunctivae normal.  Cardiovascular:     Rate and Rhythm: Normal rate and regular rhythm.  Pulmonary:     Effort: Pulmonary effort is normal.     Breath sounds: Normal breath sounds.  Abdominal:     General: Bowel sounds are normal.     Palpations: Abdomen is soft.  Musculoskeletal:        General: Normal range of motion.     Cervical back: Normal range of motion and neck supple.  Skin:     General: Skin is warm.  Neurological:     General: No focal deficit present.     Mental Status: He is alert and oriented to person, place, and time.  Psychiatric:        Mood and Affect: Mood normal.        Behavior: Behavior normal.      Assessment: *Epigastric abdominal pain *Nausea with intermittent vomiting *Diarrhea *Barrett's esophagus  Plan: Agree with infectious disease and stopping all medications currently.  We do not have any firm diagnosis of previous parasitic infection with stool studies.  Recommended continued follow-up with infectious disease next week with repeat testing.  We may need to  consider repeat imaging at some point though we will monitor his clinical course for now.  Patient is inquiring about repeat endoscopy today which we will also hold off on for now.  Patient does have biopsy-proven short segment Barrett's esophagus which I discussed in depth with him today.  Would recommend that he be on daily PPI to prevent progression of this but he would like to hold off for now.  We will tentatively plan a repeat EGD in 3 years for surveillance purposes.  Patient follow-up in 3 months.  02/06/2021 1:44 PM   Disclaimer: This note was dictated with voice recognition software. Similar sounding words can inadvertently be transcribed and may not be corrected upon review.

## 2021-02-06 NOTE — Patient Instructions (Signed)
Agree with infectious disease to hold off on any further medications for now.  Lets wait and see what your further stool studies show.  If you are still having symptoms on follow-up visit in 3 months we will consider repeat imaging versus endoscopy/colonoscopy.  We will plan on surveillance endoscopy in 3 years for Barrett's esophagus.   At [facility] we value your feedback. You may receive a survey about your visit today. Please share your experience as we strive to create trusting relationships with our patients to provide genuine, compassionate, quality care.  We appreciate your understanding and patience as we review any laboratory studies, imaging, and other diagnostic tests that are ordered as we care for you. Our office policy is 5 business days for review of these results, and any emergent or urgent results are addressed in a timely manner for your best interest. If you do not hear from our office in 1 week, please contact us.   We also encourage the use of MyChart, which contains your medical information for your review as well. If you are not enrolled in this feature, an access code is on this after visit summary for your convenience. Thank you for allowing [place] to be involved in your care.  It was great to see you today!  I hope you have a great rest of your spring!!    Elon Alas. Abbey Chatters, D.O. Gastroenterology and Hepatology [facility]

## 2021-02-07 ENCOUNTER — Other Ambulatory Visit: Payer: 59

## 2021-02-07 ENCOUNTER — Other Ambulatory Visit: Payer: Self-pay

## 2021-02-07 DIAGNOSIS — B839 Helminthiasis, unspecified: Secondary | ICD-10-CM

## 2021-02-11 ENCOUNTER — Encounter (HOSPITAL_COMMUNITY): Payer: Self-pay | Admitting: Emergency Medicine

## 2021-02-11 ENCOUNTER — Other Ambulatory Visit: Payer: Self-pay

## 2021-02-11 ENCOUNTER — Emergency Department (HOSPITAL_COMMUNITY)
Admission: EM | Admit: 2021-02-11 | Discharge: 2021-02-11 | Disposition: A | Discharge status: Home / Self Care | Payer: 59 | Attending: Emergency Medicine | Admitting: Emergency Medicine

## 2021-02-11 DIAGNOSIS — R1013 Epigastric pain: Secondary | ICD-10-CM | POA: Insufficient documentation

## 2021-02-11 DIAGNOSIS — Z87891 Personal history of nicotine dependence: Secondary | ICD-10-CM | POA: Insufficient documentation

## 2021-02-11 DIAGNOSIS — R1010 Upper abdominal pain, unspecified: Secondary | ICD-10-CM | POA: Insufficient documentation

## 2021-02-11 DIAGNOSIS — I10 Essential (primary) hypertension: Secondary | ICD-10-CM | POA: Insufficient documentation

## 2021-02-11 DIAGNOSIS — R110 Nausea: Secondary | ICD-10-CM | POA: Insufficient documentation

## 2021-02-11 DIAGNOSIS — Z79899 Other long term (current) drug therapy: Secondary | ICD-10-CM | POA: Diagnosis not present

## 2021-02-11 LAB — COMPREHENSIVE METABOLIC PANEL
ALT: 33 U/L (ref 0–44)
AST: 23 U/L (ref 15–41)
Albumin: 4.4 g/dL (ref 3.5–5.0)
Alkaline Phosphatase: 40 U/L (ref 38–126)
Anion gap: 7 (ref 5–15)
BUN: 10 mg/dL (ref 6–20)
CO2: 29 mmol/L (ref 22–32)
Calcium: 9.3 mg/dL (ref 8.9–10.3)
Chloride: 101 mmol/L (ref 98–111)
Creatinine, Ser: 0.93 mg/dL (ref 0.61–1.24)
GFR, Estimated: >60 mL/min (ref >60)
Glucose, Bld: 109 mg/dL — ABNORMAL HIGH (ref 70–99)
Potassium: 4.6 mmol/L (ref 3.5–5.1)
Sodium: 137 mmol/L (ref 135–145)
Total Bilirubin: 0.8 mg/dL (ref 0.3–1.2)
Total Protein: 7.1 g/dL (ref 6.5–8.1)

## 2021-02-11 LAB — CBC
HCT: 47.9 % (ref 39.0–52.0)
Hemoglobin: 15.9 g/dL (ref 13.0–17.0)
MCH: 32.4 pg (ref 26.0–34.0)
MCHC: 33.2 g/dL (ref 30.0–36.0)
MCV: 97.6 fL (ref 80.0–100.0)
Platelets: 243 10*3/uL (ref 150–400)
RBC: 4.91 MIL/uL (ref 4.22–5.81)
RDW: 12.5 % (ref 11.5–15.5)
WBC: 5.4 10*3/uL (ref 4.0–10.5)
nRBC: 0 % (ref 0.0–0.2)

## 2021-02-11 LAB — LIPASE, BLOOD: Lipase: 32 U/L (ref 11–51)

## 2021-02-11 MED ORDER — ACETAMINOPHEN 500 MG PO TABS
1000.0000 mg | ORAL_TABLET | Freq: Once | ORAL | Status: DC
  Filled 2021-02-11: qty 2

## 2021-02-11 MED ORDER — PANTOPRAZOLE SODIUM 40 MG PO TBEC: 40.0000 mg | DELAYED_RELEASE_TABLET | Freq: Every day | ORAL | 0 refills | Status: DC

## 2021-02-11 MED ORDER — FAMOTIDINE 20 MG PO TABS
20.0000 mg | ORAL_TABLET | Freq: Once | ORAL | Status: AC
  Administered 2021-02-11: 20 mg via ORAL
  Filled 2021-02-11: qty 1

## 2021-02-11 MED ORDER — ALUM & MAG HYDROXIDE-SIMETH 200-200-20 MG/5ML PO SUSP
30.0000 mL | Freq: Once | ORAL | Status: AC
  Administered 2021-02-11: 30 mL via ORAL
  Filled 2021-02-11: qty 30

## 2021-02-11 NOTE — ED Notes (Signed)
This nurse spoke with pt and explained that the most recent provider he spoke with at 1408 was in fact his doctor. Explained the MD did discuss with him the results and POC for discharge. Pt still refuses discharge and wants to speak with the doctor. Communication sent to MD.

## 2021-02-11 NOTE — ED Provider Notes (Signed)
Emergency Medicine Provider Triage Evaluation Note  Logan Benton , a 40 y.o. male  was evaluated in triage.  Pt complains of upper abd pain for past 1-2 months.   Review of Systems  Positive: abd pain Negative: fever  Physical Exam  BP (!) 147/89 (BP Location: Right Arm)   Pulse 61   Temp 97.8 F (36.6 C) (Oral)   Resp 16   Ht 1.803 m (5\' 11" )   Wt 68 kg   SpO2 100%   BMI 20.92 kg/m  Gen:   Awake, no distress   Resp:  Normal effort  Abd:   Soft, non tender.   Medical Decision Making  Medically screening exam initiated at 11:46 AM.  Appropriate orders placed.  Logan Benton was informed that the remainder of the evaluation will be completed by another provider, this initial triage assessment does not replace that evaluation, and the importance of remaining in the ED until their evaluation is complete.  Labs ordered. Will move to treatment room once available.      Lajean Saver, MD 02/11/21 1147

## 2021-02-11 NOTE — ED Notes (Signed)
Pt states pain in left lower abd. Advised I would tell his RN

## 2021-02-11 NOTE — Discharge Instructions (Addendum)
It was our pleasure to provide your ER care today - we hope that you feel better.  Your lab work looks good/normal.  Prior imaging tests were also reviewed and overall look good, as does your most recent O and P test.   Follow up closely with your doctors - ID doctor tomorrow as planned, and with your Gi doctor as well.  You may try protonix (acid blocker therapy).  If feeling bloated/gas, may try gas-x or similar medication for symptom relief.   Return to ER if worse, new symptoms, fevers, persistent vomiting, or other concern.

## 2021-02-11 NOTE — ED Provider Notes (Addendum)
[facility] EMERGENCY DEPARTMENT Provider Note   CSN: [id number] Arrival date & time: 02/11/21  1040     History Chief Complaint  Patient presents with  . Abdominal Pain    Logan Benton is a 40 y.o. male.  Patient c/o epigastric/upper abdominal pain in past 1-2 months. Symptoms gradual onset, constant, moderate, persistent, without specific exacerbating or alleviating factors. +nausea. No vomiting. No abd distension. Notes poor appetite. Symptoms not temporally related to eating or post-eating. Is having normal bms. States at/around onset symptoms did not 'small worms' in stool - was placed on several meds for same (although patient reports two negative O&P results, and no confirmation of worms noted in medical records).  Pt also notes prior gi eval for abd pain - states was told possible Barretts esophagus, but otherwise denies specific gi diagnosis. No definite hx pud, duodenal ulcers, gallstones, or pancreatitis. No back/flank pain. No gu c/o. No fever or chills. Denies chest pain or discomfort. No sob. No cough or uri symptoms.  States has seen ED, pcp, gi and ID for same, and that he has appt with ID doctor tomorrow.   The history is provided by the patient.  Abdominal Pain Associated symptoms: no chest pain, no cough, no dysuria, no fever, no shortness of breath and no sore throat        Past Medical History:  Diagnosis Date  . Abscessed tooth   . Anxiety   . C. difficile diarrhea    presumptive diagnosis  . Depression   . Hypertension   . Trigger finger     Patient Active Problem List   Diagnosis Date Noted  . Diarrhea 12/31/2020  . Gastroesophageal reflux disease 12/31/2020  . Oral candidiasis 12/31/2020  . N&V (nausea and vomiting) 12/18/2020  . Loss of weight 12/18/2020  . Worms in stool 12/18/2020  . Abdominal pain, epigastric 06/13/2020  . Proctocolitis 06/13/2020  . Gallbladder polyp 06/13/2020    Past Surgical History:  Procedure Laterality Date  .  BILATERAL CARPAL TUNNEL RELEASE    . BIOPSY  06/29/2020   Procedure: BIOPSY;  Surgeon: Eloise Harman, DO;  [facility]: [facility];  Service: Endoscopy;;  . BIOPSY  09/03/2020   Procedure: BIOPSY;  Surgeon: Eloise Harman, DO;  [facility];  Service: Endoscopy;;  . COLONOSCOPY WITH PROPOFOL N/A 06/29/2020   Dr. Abbey Chatters: 4 mm tubular adenoma removed, nonbleeding internal hemorrhoids.  Repeat colonoscopy in 7 years.  . ESOPHAGOGASTRODUODENOSCOPY (EGD) WITH PROPOFOL N/A 06/29/2020   Dr. Abbey Chatters: LA grade C esophagitis, gastritis with mild chronic gastritis on path, small bowel biopsy unremarkable.  . ESOPHAGOGASTRODUODENOSCOPY (EGD) WITH PROPOFOL N/A 09/03/2020   Dr. Abbey Chatters: Esophageal mucosal changes suggestive of short segment Barrett's esophagus, biopsies consistent.  Next EGD in 3 years  . POLYPECTOMY  06/29/2020   Procedure: POLYPECTOMY;  Surgeon: Eloise Harman, DO;  [facility]: [facility];  Service: Endoscopy;;  . SHOULDER SURGERY Right    x2  . TRIGGER FINGER RELEASE Right 2016       Family History  Problem Relation Age of Onset  . Rheum arthritis Mother   . Bladder Cancer Mother   . Kidney disease Father   . Bladder Cancer Maternal Grandfather   . Irritable bowel syndrome Paternal Grandmother   . Colon cancer Neg Hx   . Celiac disease Neg Hx   . Inflammatory bowel disease Neg Hx     Social History   Tobacco Use  . Smoking status: Former Smoker  Packs/day: 0.00    Years: 0.00    Pack years: 0.00    Quit date: 11/03/2019    Years since quitting: 1.2  . Smokeless tobacco: Never Used  . Tobacco comment: nicoten gum  Vaping Use  . Vaping Use: Former  . Quit date: 03/07/2019  Substance Use Topics  . Alcohol use: No  . Drug use: Not Currently    Home Medications Prior to Admission medications   Medication Sig Start Date End Date Taking? Authorizing Provider  acetaminophen (TYLENOL) 500 MG tablet Take 500-1,000 mg by mouth as needed (for pain.).     [provider]  baclofen (LIORESAL) 10 MG tablet Take 10 mg by mouth 2 (two) times daily.  04/10/20   [provider]  lisinopril (ZESTRIL) 5 MG tablet Take 5 mg by mouth at bedtime. 06/19/20   [provider]  Multiple Vitamins-Minerals (ADULT ONE DAILY GUMMIES PO) Take 2 tablets by mouth daily.    [provider]  Probiotic Product (PROBIOTIC-10 PO) Take by mouth in the morning and at bedtime.    [provider]    Allergies    Patient has no known allergies.  Review of Systems   Review of Systems  Constitutional: Negative for fever.  HENT: Negative for sore throat.   Eyes: Negative for redness.  Respiratory: Negative for cough and shortness of breath.   Cardiovascular: Negative for chest pain.  Gastrointestinal: Positive for abdominal pain.  Genitourinary: Negative for dysuria and flank pain.  Musculoskeletal: Negative for back pain and neck pain.  Skin: Negative for rash.  Neurological: Negative for headaches.  Hematological: Does not bruise/bleed easily.  Psychiatric/Behavioral: Negative for confusion.    Physical Exam Updated Vital Signs BP (!) 147/89 (BP Location: Right Arm)   Pulse 61   Temp 97.8 F (36.6 C) (Oral)   Resp 16   Ht 1.803 m (5\' 11" )   Wt 68 kg   SpO2 100%   BMI 20.92 kg/m   Physical Exam Vitals and nursing note reviewed.  Constitutional:      Appearance: Normal appearance. He is well-developed.  HENT:     Head: Atraumatic.     Nose: Nose normal.     Mouth/Throat:     Mouth: Mucous membranes are moist.     Pharynx: Oropharynx is clear.  Eyes:     General: No scleral icterus.    Conjunctiva/sclera: Conjunctivae normal.  Neck:     Trachea: No tracheal deviation.  Cardiovascular:     Rate and Rhythm: Normal rate and regular rhythm.     Pulses: Normal pulses.     Heart sounds: Normal heart sounds. No murmur heard. No friction rub. No gallop.   Pulmonary:     Effort: Pulmonary effort is normal. No  accessory muscle usage or respiratory distress.     Breath sounds: Normal breath sounds.  Abdominal:     General: Bowel sounds are normal. There is no distension.     Palpations: Abdomen is soft. There is no mass.     Tenderness: There is no abdominal tenderness. There is no guarding or rebound.     Hernia: No hernia is present.  Genitourinary:    Comments: No cva tenderness. Musculoskeletal:        General: No swelling or tenderness.     Cervical back: Normal range of motion and neck supple. No rigidity.  Skin:    General: Skin is warm and dry.     Findings: No rash.  Neurological:  Mental Status: He is alert.     Comments: Alert, speech clear.   Psychiatric:     Comments: Mildly anxious appearing.      ED Results / Procedures / Treatments   Labs (all labs ordered are listed, but only abnormal results are displayed) Results for orders placed or performed during the hospital encounter of 02/11/21  CBC  Result Value Ref Range   WBC 5.4 4.0 - 10.5 K/uL   RBC 4.91 4.22 - 5.81 MIL/uL   Hemoglobin 15.9 13.0 - 17.0 g/dL   HCT 47.9 39.0 - 52.0 %   MCV 97.6 80.0 - 100.0 fL   MCH 32.4 26.0 - 34.0 pg   MCHC 33.2 30.0 - 36.0 g/dL   RDW 12.5 11.5 - 15.5 %   Platelets 243 150 - 400 K/uL   nRBC 0.0 0.0 - 0.2 %  Comprehensive metabolic panel  Result Value Ref Range   Sodium 137 135 - 145 mmol/L   Potassium 4.6 3.5 - 5.1 mmol/L   Chloride 101 98 - 111 mmol/L   CO2 29 22 - 32 mmol/L   Glucose, Bld 109 (H) 70 - 99 mg/dL   BUN 10 6 - 20 mg/dL   Creatinine, Ser 0.93 0.61 - 1.24 mg/dL   Calcium 9.3 8.9 - 10.3 mg/dL   Total Protein 7.1 6.5 - 8.1 g/dL   Albumin 4.4 3.5 - 5.0 g/dL   AST 23 15 - 41 U/L   ALT 33 0 - 44 U/L   Alkaline Phosphatase 40 38 - 126 U/L   Total Bilirubin 0.8 0.3 - 1.2 mg/dL   GFR, Estimated >60 >60 mL/min   Anion gap 7 5 - 15  Lipase, blood  Result Value Ref Range   Lipase 32 11 - 51 U/L    EKG None  Radiology No results  found.  Procedures Procedures   Medications Ordered in ED Medications - No data to display  ED Course  I have reviewed the triage vital signs and the nursing notes.  Pertinent labs & imaging results that were available during my care of the patient were reviewed by me and considered in my medical decision making (see chart for details).    MDM Rules/Calculators/A&P                         Labs sent.   Reviewed nursing notes and prior charts for additional history.  Recent O and P test and gi panel noted to be negative. Prior CT and u/s for upper abd pain also neg for acute process. Prior EGD last year without significant acute process noted.   Labs reviewed/interpreted by me - chem normal.  Pepcid, maalox given for symptom relief.   Patient appears upset that todays labs normal, and that more tests not being done - I discussed that with ct x2 and u/s this past year for similar symptoms being neg acute, two O and P results neg, and that patient has close ID f/u (tomrrow), and followed closely by GI that additional ED testing likely not helpful/value added for him. Discussed could try ppi, outpt f/u.  ?possible delusional parasitosis, vs other organic syndrome.   Return precautions provided.      Final Clinical Impression(s) / ED Diagnoses Final diagnoses:  None    Rx / DC Orders ED Discharge Orders    None         Lajean Saver, MD 02/11/21 1415

## 2021-02-11 NOTE — ED Notes (Signed)
Went to discharge patient and the patient states he did not know that he was being discharged and states he had not been updated on test results and that a PA told him he would go speak with the doctor. Patient refusing to be discharged at this time due to not being informed.

## 2021-02-11 NOTE — ED Notes (Signed)
MD speaking with pt again.

## 2021-02-11 NOTE — ED Notes (Signed)
MD has been to bedside to discuss test results and POC.

## 2021-02-11 NOTE — ED Notes (Signed)
Pt declined e-signature at discharge.

## 2021-02-11 NOTE — ED Triage Notes (Signed)
Pt c/o upper abd with "back pressure" x 3 weeks, reports he is seen by an infectious disease clinic and sees Dr. Abbey Chatters for GI and was recommended to come to ER

## 2021-02-12 ENCOUNTER — Encounter: Payer: Self-pay | Admitting: Internal Medicine

## 2021-02-12 ENCOUNTER — Ambulatory Visit (INDEPENDENT_AMBULATORY_CARE_PROVIDER_SITE_OTHER): Payer: 59 | Admitting: Internal Medicine

## 2021-02-12 VITALS — BP 130/80 | HR 66 | Temp 98.1°F | Wt 145.0 lb

## 2021-02-12 DIAGNOSIS — B839 Helminthiasis, unspecified: Secondary | ICD-10-CM | POA: Diagnosis not present

## 2021-02-12 DIAGNOSIS — R197 Diarrhea, unspecified: Secondary | ICD-10-CM | POA: Diagnosis not present

## 2021-02-12 DIAGNOSIS — B370 Candidal stomatitis: Secondary | ICD-10-CM | POA: Diagnosis not present

## 2021-02-12 NOTE — Assessment & Plan Note (Signed)
Checking the O and P but no results yet.  Previously was negative but had been on treatment.  I discussed with him that since he has been off treatment and with his symptomatology, I would anticipate a high burden of parasites in stool if that was the current cause of his symptoms and therefore the O and P would be clearly positive.  If negative though, will have him do a second sample but if negative x 2, no further sampling indicated.  I discussed that these may be residual symptoms of a post infectious nature similar to post infectious IBD from bacterial/traveler's diarrhea.

## 2021-02-12 NOTE — Assessment & Plan Note (Signed)
This has resolved at this time, no further treatment indicated.

## 2021-02-12 NOTE — Assessment & Plan Note (Signed)
As above, if no parasites identified, may be post infectious vs other etiology and will continue to follow up with GI and his PCP for this if above negative.

## 2021-02-12 NOTE — Progress Notes (Signed)
   Subjective:    Patient ID: Logan Benton, male    DOB: August 15, 1981, 40 y.o.   MRN: [id number]  HPI He is here for follow up of GI symptoms.   He went to the ED yesterday with considerable worsening of symptoms, particularly gastrointestinal pain and feelings of bloating.  He has continued diarrhea.  He has felt a band of pain in his abdomen flank to flank around the front.  Still feels like something is 'moving' inside.  No further thrush.  Continues to have severe fatigue though does force himself to exercise.  Trying to eat well despite poor appetite.  Stool sample for O and P turned in to the lab on Thursday, no results yet.     Review of Systems  Constitutional: Positive for fatigue. Negative for fever and unexpected weight change.  Gastrointestinal: Positive for abdominal distention, abdominal pain and diarrhea. Negative for nausea.  Skin: Negative for rash.       Objective:   Physical Exam HENT:     Mouth/Throat:     Mouth: Mucous membranes are moist.     Pharynx: Oropharynx is clear. No oropharyngeal exudate or posterior oropharyngeal erythema.  Eyes:     General: No scleral icterus. Abdominal:     General: Abdomen is flat. There is no distension.     Palpations: Abdomen is soft.     Tenderness: There is no abdominal tenderness. There is no guarding or rebound.  Neurological:     General: No focal deficit present.     Mental Status: He is alert.  Psychiatric:        Mood and Affect: Mood normal.           Assessment & Plan:

## 2021-02-13 ENCOUNTER — Other Ambulatory Visit: Payer: 59

## 2021-02-13 ENCOUNTER — Other Ambulatory Visit: Payer: Self-pay

## 2021-02-13 DIAGNOSIS — R197 Diarrhea, unspecified: Secondary | ICD-10-CM

## 2021-02-13 DIAGNOSIS — B839 Helminthiasis, unspecified: Secondary | ICD-10-CM

## 2021-02-14 ENCOUNTER — Telehealth: Payer: Self-pay

## 2021-02-14 LAB — OVA AND PARASITE EXAMINATION
CONCENTRATE RESULT:: NONE SEEN
MICRO NUMBER:: 11854414
SPECIMEN QUALITY:: ADEQUATE
TRICHROME RESULT:: NONE SEEN

## 2021-02-14 NOTE — Telephone Encounter (Signed)
Patient returning call, RN advised him office will call him once results have come back. Patient verbalized understanding and has no further questions.   Beryle Flock, RN

## 2021-02-14 NOTE — Telephone Encounter (Signed)
Patient left voicemail requesting recent lab results.   Beryle Flock, RN

## 2021-02-14 NOTE — Telephone Encounter (Signed)
Attempted to call patient to let him know we are still waiting on the results from the 02/07/21 specimen, no answer. Left HIPAA compliant voicemail requesting callback.   Beryle Flock, RN

## 2021-02-19 LAB — OVA AND PARASITE EXAMINATION
CONCENTRATE RESULT:: NONE SEEN
MICRO NUMBER:: 11878414
SPECIMEN QUALITY:: ADEQUATE
TRICHROME RESULT:: NONE SEEN

## 2021-02-20 ENCOUNTER — Telehealth: Payer: Self-pay

## 2021-02-20 NOTE — Telephone Encounter (Signed)
-----   Message from Thayer Headings, MD sent at 02/20/2021 11:33 AM EDT ----- Can you please let him know that his recent ova and parasite exam is again negative for parasites. No active parasites at this time and no treatment indicated.  He should continue to work with his PCP regarding his symptoms. thanks

## 2021-02-20 NOTE — Telephone Encounter (Signed)
Patient informed of test results; no parasites or ova present. Encouraged to follow up with PCP for symptom management. No further questions/concerns at this time.   Keyarra Rendall Lorita Officer, RN

## 2021-02-26 ENCOUNTER — Other Ambulatory Visit: Payer: Self-pay

## 2021-02-26 ENCOUNTER — Ambulatory Visit (INDEPENDENT_AMBULATORY_CARE_PROVIDER_SITE_OTHER): Payer: 59 | Admitting: Internal Medicine

## 2021-02-26 ENCOUNTER — Encounter: Payer: Self-pay | Admitting: Internal Medicine

## 2021-02-26 VITALS — BP 151/80 | HR 65 | Temp 98.4°F | Resp 18 | Ht 71.0 in | Wt 150.1 lb

## 2021-02-26 DIAGNOSIS — Z7689 Persons encountering health services in other specified circumstances: Secondary | ICD-10-CM | POA: Diagnosis not present

## 2021-02-26 DIAGNOSIS — I10 Essential (primary) hypertension: Secondary | ICD-10-CM

## 2021-02-26 DIAGNOSIS — M5430 Sciatica, unspecified side: Secondary | ICD-10-CM

## 2021-02-26 DIAGNOSIS — K22719 Barrett's esophagus with dysplasia, unspecified: Secondary | ICD-10-CM

## 2021-02-26 DIAGNOSIS — B370 Candidal stomatitis: Secondary | ICD-10-CM

## 2021-02-26 DIAGNOSIS — B839 Helminthiasis, unspecified: Secondary | ICD-10-CM | POA: Diagnosis not present

## 2021-02-26 DIAGNOSIS — R197 Diarrhea, unspecified: Secondary | ICD-10-CM

## 2021-02-26 MED ORDER — FLUCONAZOLE 200 MG PO TABS: 200.0000 mg | ORAL_TABLET | Freq: Every day | ORAL | 0 refills | Status: DC

## 2021-02-26 NOTE — Assessment & Plan Note (Signed)
Was on Pantoprazole, states he had discussion with GI about stopping it.

## 2021-02-26 NOTE — Assessment & Plan Note (Signed)
Intermittent, s/p ?parasitic treatment On Probiotics Follow up with GI for evaluation for possible SIBO

## 2021-02-26 NOTE — Assessment & Plan Note (Signed)
Care established History and medications reviewed with the patient 

## 2021-02-26 NOTE — Assessment & Plan Note (Signed)
Thrush not visible currently But reports h/o it and current dysphagia?/throat irritation Fluconazole 200 mg QD

## 2021-02-26 NOTE — Progress Notes (Signed)
New Patient Office Visit  Subjective:  Patient ID: Logan Benton, male    DOB: 02-27-1981  Age: 40 y.o. MRN: [id number]  CC:  Chief Complaint  Patient presents with  . New Patient (Initial Visit)    New patient would like some diflucan called in was on it for stomach but feels like its coming back     HPI Logan Benton is a 40 year old male with PMH of HTN, incomplete RBBB, sciatica and intestinal worm infestation who presents for establishing care.  He has had treatment with Mebendazole for intestinal worm infestation, and has had self-treated with Pyrantel in the past. Of note, he did not have lab confirmed infestation. He has been following up with GI and ID. He also had treatment with Fluconazole for oral candidiasis and states that his symptoms resolved with it. But he has been experiencing similar c/o LUQ pain, nausea and bloating as he had after taking Doxycycline and was treated with Fluconazole. He also reports noticing oral thrush recently and has been having throat irritation. He has been having intermittent diarrhea, but denies melena or hematochezia. He has noticed mucus in the stool as well. He has an appointment with GI tomorrow.  He has been taking Baclofen for sciatic pain and ?anxiety. He states that he prefers to taper it off, although he has been taking only 10 mg dose.  His BP was elevated in the office today, but states that his BP remains stable at home. He takes Lisinopril 5 mg QD. Denies any headache, dizziness, chest pain, dyspnea or palpitations.  He has had 2 doses of COVID vaccine.    Past Medical History:  Diagnosis Date  . Abscessed tooth   . Anxiety   . C. difficile diarrhea    presumptive diagnosis  . Depression   . Hypertension   . Trigger finger     Past Surgical History:  Procedure Laterality Date  . BILATERAL CARPAL TUNNEL RELEASE    . BIOPSY  06/29/2020   Procedure: BIOPSY;  Surgeon: Eloise Harman, DO;  Location: [facility];   Service: Endoscopy;;  . BIOPSY  09/03/2020   Procedure: BIOPSY;  Surgeon: Eloise Harman, DO;  Location: [facility];  Service: Endoscopy;;  . COLONOSCOPY WITH PROPOFOL N/A 06/29/2020   Dr. Abbey Chatters: 4 mm tubular adenoma removed, nonbleeding internal hemorrhoids.  Repeat colonoscopy in 7 years.  . ESOPHAGOGASTRODUODENOSCOPY (EGD) WITH PROPOFOL N/A 06/29/2020   Dr. Abbey Chatters: LA grade C esophagitis, gastritis with mild chronic gastritis on path, small bowel biopsy unremarkable.  . ESOPHAGOGASTRODUODENOSCOPY (EGD) WITH PROPOFOL N/A 09/03/2020   Dr. Abbey Chatters: Esophageal mucosal changes suggestive of short segment Barrett's esophagus, biopsies consistent.  Next EGD in 3 years  . POLYPECTOMY  06/29/2020   Procedure: POLYPECTOMY;  Surgeon: Eloise Harman, DO;  Location: [facility];  Service: Endoscopy;;  . SHOULDER SURGERY Right    x2  . TRIGGER FINGER RELEASE Right 2016    Family History  Problem Relation Age of Onset  . Rheum arthritis Mother   . Bladder Cancer Mother   . Kidney disease Father   . Bladder Cancer Maternal Grandfather   . Irritable bowel syndrome Paternal Grandmother   . Colon cancer Neg Hx   . Celiac disease Neg Hx   . Inflammatory bowel disease Neg Hx     Social History   Socioeconomic History  . Marital status: Divorced    Spouse name: Not on file  . Number of children: Not on  file  . Years of education: Not on file  . Highest education level: Not on file  Occupational History  . Not on file  Tobacco Use  . Smoking status: Former Smoker    Packs/day: 0.00    Years: 0.00    Pack years: 0.00    Quit date: 11/03/2019    Years since quitting: 1.3  . Smokeless tobacco: Never Used  . Tobacco comment: nicoten gum  Vaping Use  . Vaping Use: Former  . Quit date: 03/07/2019  Substance and Sexual Activity  . Alcohol use: No  . Drug use: Not Currently  . Sexual activity: Not Currently    Partners: Female  Other Topics Concern  . Not on file  Social History  Narrative  . Not on file   Social Determinants of Health   Financial Resource Strain: Not on file  Food Insecurity: Not on file  Transportation Needs: Not on file  Physical Activity: Not on file  Stress: Not on file  Social Connections: Not on file  Intimate Partner Violence: Not on file    ROS Review of Systems  Constitutional: Negative for chills and fever.  HENT: Negative for congestion and sore throat.   Eyes: Negative for pain and discharge.  Respiratory: Negative for cough and shortness of breath.   Cardiovascular: Negative for chest pain and palpitations.  Gastrointestinal: Positive for abdominal pain and diarrhea. Negative for constipation, nausea and vomiting.  Endocrine: Negative for polydipsia and polyuria.  Genitourinary: Negative for dysuria and hematuria.  Musculoskeletal: Negative for neck pain and neck stiffness.  Skin: Negative for rash.  Neurological: Negative for dizziness, weakness, numbness and headaches.  Psychiatric/Behavioral: Negative for agitation and behavioral problems.    Objective:   Today's Vitals: BP (!) 151/80 (BP Location: Right Arm, Patient Position: Sitting, Cuff Size: Normal)   Pulse 65   Temp 98.4 F (36.9 C) (Oral)   Resp 18   Ht 5\' 11"  (1.803 m)   Wt 150 lb 1.9 oz (68.1 kg)   SpO2 100%   BMI 20.94 kg/m   Physical Exam Vitals reviewed.  Constitutional:      General: He is not in acute distress.    Appearance: He is not diaphoretic.  HENT:     Head: Normocephalic and atraumatic.     Nose: Nose normal.     Mouth/Throat:     Mouth: Mucous membranes are moist.  Eyes:     General: No scleral icterus.    Extraocular Movements: Extraocular movements intact.  Cardiovascular:     Rate and Rhythm: Normal rate and regular rhythm.     Pulses: Normal pulses.     Heart sounds: Normal heart sounds. No murmur heard.   Pulmonary:     Breath sounds: Normal breath sounds. No wheezing or rales.  Abdominal:     Palpations: Abdomen is  soft.     Tenderness: There is no abdominal tenderness.  Musculoskeletal:     Cervical back: Neck supple. No tenderness.     Right lower leg: No edema.     Left lower leg: No edema.  Skin:    General: Skin is warm.     Findings: No rash.  Neurological:     General: No focal deficit present.     Mental Status: He is alert and oriented to person, place, and time.  Psychiatric:        Mood and Affect: Mood normal.        Behavior: Behavior normal.  Assessment & Plan:   Problem List Items Addressed This Visit      Cardiovascular and Mediastinum   Primary hypertension    BP Readings from Last 1 Encounters:  02/26/21 (!) 151/80   Elevated today, but could related to nervousness related to first visit Overall stable at home On Lisinopril 5 mg QD Counseled for compliance with the medications Advised DASH diet and moderate exercise/walking, at least 150 mins/week         Digestive   Barrett's esophagus    Was on Pantoprazole, states he had discussion with GI about stopping it.      Oral candidiasis    Thrush not visible currently But reports h/o it and current dysphagia?/throat irritation Fluconazole 200 mg QD      Relevant Medications   fluconazole (DIFLUCAN) 200 MG tablet     Nervous and Auditory   Sciatica    Has been taking Baclofen 10 mg BID Also reports it was given for possible effect on anxiety Prefers to taper it slowly although currently only on 10 mg dose        Other   Diarrhea    Intermittent, s/p ?parasitic treatment On Probiotics Follow up with GI for evaluation for possible SIBO      Worms in stool    Treated with Mebendazole in the past Has had multiple negative stool tests      Encounter to establish care - Primary    Care established History and medications reviewed with the patient          Outpatient Encounter Medications as of 02/26/2021  Medication Sig  . acetaminophen (TYLENOL) 500 MG tablet Take 500-1,000 mg by mouth  as needed (for pain.).  Marland Kitchen baclofen (LIORESAL) 10 MG tablet Take 10 mg by mouth 2 (two) times daily.   . fluconazole (DIFLUCAN) 200 MG tablet Take 1 tablet (200 mg total) by mouth daily.  Marland Kitchen lisinopril (ZESTRIL) 5 MG tablet Take 5 mg by mouth at bedtime.  . Multiple Vitamins-Minerals (ADULT ONE DAILY GUMMIES PO) Take 2 tablets by mouth daily.  . Probiotic Product (PROBIOTIC-10 PO) Take by mouth in the morning and at bedtime.  . [DISCONTINUED] pantoprazole (PROTONIX) 40 MG tablet Take 1 tablet (40 mg total) by mouth daily. (Patient not taking: Reported on 02/12/2021)   No facility-administered encounter medications on file as of 02/26/2021.    Follow-up: Return in about 3 months (around 05/29/2021) for HTN.   Lindell Spar, MD

## 2021-02-26 NOTE — Assessment & Plan Note (Signed)
Has been taking Baclofen 10 mg BID Also reports it was given for possible effect on anxiety Prefers to taper it slowly although currently only on 10 mg dose

## 2021-02-26 NOTE — Patient Instructions (Signed)
Please start taking Baclofen 1/2 tablet twice daily for 1 week and then 1/2 tablet once daily for 1 more week.  Please continue taking other medications as prescribed.

## 2021-02-26 NOTE — Assessment & Plan Note (Signed)
BP Readings from Last 1 Encounters:  02/26/21 (!) 151/80   Elevated today, but could related to nervousness related to first visit Overall stable at home On Lisinopril 5 mg QD Counseled for compliance with the medications Advised DASH diet and moderate exercise/walking, at least 150 mins/week

## 2021-02-26 NOTE — Assessment & Plan Note (Signed)
Treated with Mebendazole in the past Has had multiple negative stool tests

## 2021-02-27 ENCOUNTER — Encounter: Payer: Self-pay | Admitting: Gastroenterology

## 2021-02-27 ENCOUNTER — Telehealth: Payer: Self-pay

## 2021-02-27 ENCOUNTER — Telehealth: Payer: Self-pay | Admitting: Gastroenterology

## 2021-02-27 ENCOUNTER — Ambulatory Visit (INDEPENDENT_AMBULATORY_CARE_PROVIDER_SITE_OTHER): Payer: 59 | Admitting: Gastroenterology

## 2021-02-27 VITALS — BP 131/76 | HR 58 | Temp 97.3°F | Ht 71.0 in | Wt 149.2 lb

## 2021-02-27 DIAGNOSIS — R1013 Epigastric pain: Secondary | ICD-10-CM | POA: Diagnosis not present

## 2021-02-27 DIAGNOSIS — K2270 Barrett's esophagus without dysplasia: Secondary | ICD-10-CM | POA: Diagnosis not present

## 2021-02-27 DIAGNOSIS — K588 Other irritable bowel syndrome: Secondary | ICD-10-CM

## 2021-02-27 DIAGNOSIS — K589 Irritable bowel syndrome without diarrhea: Secondary | ICD-10-CM | POA: Insufficient documentation

## 2021-02-27 MED ORDER — RIFAXIMIN 550 MG PO TABS: 550.0000 mg | ORAL_TABLET | Freq: Three times a day (TID) | ORAL | 0 refills | Status: AC

## 2021-02-27 NOTE — Telephone Encounter (Signed)
Pt has been approved to get Xifaxan 550 mg tablets from 02/27/2021 to 05/22/2021. The pt is aware as well as Roseanne Kaufman. Will give to Manuela Schwartz to send to scan.

## 2021-02-27 NOTE — H&P (View-Only) (Signed)
Referring Provider: Leonie Douglas, MD Primary Care Physician:  Lindell Spar, MD Primary GI: Dr. Abbey Chatters  Chief Complaint  Patient presents with  . Abdominal Pain    Upper abd pain, itching goes into back after eating  . Bloated  . white/yellow mucous in stool  . Diarrhea    occ    HPI:   Logan Benton is a 40 y.o. male presenting today with a history of epigastric pain, N/V, weight loss, concern for worms in stool (self-reported). Symptom onset Jan 2022 and treated empirically by PCP. O&P negative multiple times. Has seen ID and had additional tests.   Colonoscopy Sept 2021 with tubular adenoma, EGD with Grade C esophagitis, gastritis, small bowel unremarkable. Early interval EGD Nov 2021 with short segment Barrett's, 3 year surveillance recommended.   CT July 2021 with possible mild proctocolitis. Abd xray March 2022 with moderate stool in colon. Lipase normal, CMP normal, no anemia on CBC  Positive marijuana March 2022. Seen in ED in [facility] with complaints of "total body pain". ED physician ordered blood cultures due to his concern for system infection.   Intermittent diarrhea, yellow/white mucus. LUQ pain radiating around to back, with an itching sensation almost feeling like it fell asleep. Onset in last few weeks. Worsening epigastric/LUQ pain after eating. No NSAIDs. Has been off of PPI. Still has 20 mg omeprazole and will start taking that. Hurts with everything he eats. Worsening now.   Intermittent diarrhea, every other day, comes and goes. Not associated with other pain. No rectal bleeding. Has gained some weight. Appetite not great but force feeding self. Feels generally unwell.   Prior to onset in Jan 2022, would have normal BM once to three times per day. Diflucan prescribed per PCP and pt awaiting GI input first. Has a scratchy throat. For weeks. No fever. Tingling in mouth.    Past Medical History:  Diagnosis Date  . Abscessed tooth   . Anxiety   . C.  difficile diarrhea    presumptive diagnosis  . Depression   . Hypertension   . Trigger finger     Past Surgical History:  Procedure Laterality Date  . BILATERAL CARPAL TUNNEL RELEASE    . BIOPSY  06/29/2020   Procedure: BIOPSY;  Surgeon: Eloise Harman, DO;  [facility]: [facility];  Service: Endoscopy;;  . BIOPSY  09/03/2020   Procedure: BIOPSY;  Surgeon: Eloise Harman, DO;  [facility]: [facility];  Service: Endoscopy;;  . COLONOSCOPY WITH PROPOFOL N/A 06/29/2020   Dr. Abbey Chatters: 4 mm tubular adenoma removed, nonbleeding internal hemorrhoids.  Repeat colonoscopy in 7 years.  . ESOPHAGOGASTRODUODENOSCOPY (EGD) WITH PROPOFOL N/A 06/29/2020   Dr. Abbey Chatters: LA grade C esophagitis, gastritis with mild chronic gastritis on path, small bowel biopsy unremarkable.  . ESOPHAGOGASTRODUODENOSCOPY (EGD) WITH PROPOFOL N/A 09/03/2020   Dr. Abbey Chatters: Esophageal mucosal changes suggestive of short segment Barrett's esophagus, biopsies consistent.  Next EGD in 3 years  . POLYPECTOMY  06/29/2020   Procedure: POLYPECTOMY;  Surgeon: Eloise Harman, DO;  Location: [facility];  Service: Endoscopy;;  . SHOULDER SURGERY Right    x2  . TRIGGER FINGER RELEASE Right 2016    Current Outpatient Medications  Medication Sig Dispense Refill  . acetaminophen (TYLENOL) 500 MG tablet Take 500-1,000 mg by mouth as needed (for pain.).    Marland Kitchen baclofen (LIORESAL) 10 MG tablet Take 10 mg by mouth 2 (two) times daily.     . fluconazole (DIFLUCAN) 200 MG  tablet Take 1 tablet (200 mg total) by mouth daily. 7 tablet 0  . lisinopril (ZESTRIL) 5 MG tablet Take 5 mg by mouth at bedtime.    . Multiple Vitamins-Minerals (ADULT ONE DAILY GUMMIES PO) Take 2 tablets by mouth daily.    . Probiotic Product (PROBIOTIC-10 PO) Take by mouth in the morning and at bedtime.    . rifaximin (XIFAXAN) 550 MG TABS tablet Take 1 tablet (550 mg total) by mouth 3 (three) times daily for 14 days. 42 tablet 0   No current facility-administered  medications for this visit.    Allergies as of 02/27/2021  . (No Known Allergies)    Family History  Problem Relation Age of Onset  . Rheum arthritis Mother   . Bladder Cancer Mother   . Kidney disease Father   . Bladder Cancer Maternal Grandfather   . Irritable bowel syndrome Paternal Grandmother   . Colon cancer Neg Hx   . Celiac disease Neg Hx   . Inflammatory bowel disease Neg Hx     Social History   Socioeconomic History  . Marital status: Divorced    Spouse name: Not on file  . Number of children: Not on file  . Years of education: Not on file  . Highest education level: Not on file  Occupational History  . Not on file  Tobacco Use  . Smoking status: Former Smoker    Packs/day: 0.00    Years: 0.00    Pack years: 0.00    Quit date: 11/03/2019    Years since quitting: 1.3  . Smokeless tobacco: Never Used  . Tobacco comment: nicoten gum  Vaping Use  . Vaping Use: Former  . Quit date: 03/07/2019  Substance and Sexual Activity  . Alcohol use: No  . Drug use: Not Currently  . Sexual activity: Not Currently    Partners: Female  Other Topics Concern  . Not on file  Social History Narrative  . Not on file   Social Determinants of Health   Financial Resource Strain: Not on file  Food Insecurity: Not on file  Transportation Needs: Not on file  Physical Activity: Not on file  Stress: Not on file  Social Connections: Not on file    Review of Systems: Gen: Denies fever, chills, anorexia. Denies fatigue, weakness, weight loss.  CV: Denies chest pain, palpitations, syncope, peripheral edema, and claudication. Resp: Denies dyspnea at rest, cough, wheezing, coughing up blood, and pleurisy. GI: see HPI Derm: Denies rash, itching, dry skin Psych: Denies depression, anxiety, memory loss, confusion. No homicidal or suicidal ideation.  Heme: Denies bruising, bleeding, and enlarged lymph nodes.  Physical Exam: BP 131/76   Pulse (!) 58   Temp (!) 97.3 F (36.3 C)  (Temporal)   Ht 5\' 11"  (1.803 m)   Wt 149 lb 3.2 oz (67.7 kg)   BMI 20.81 kg/m  General:   Alert and oriented. No distress noted. Pleasant and cooperative.  Head:  Normocephalic and atraumatic. Eyes:  Conjuctiva clear without scleral icterus. Mouth:  Mask in place  Abdomen:  +BS, soft, non-tender and non-distended. No rebound or guarding. No HSM or masses noted. Msk:  Symmetrical without gross deformities. Normal posture. Extremities:  Without edema. Neurologic:  Alert and  oriented x4 Psych:  Alert and cooperative. Normal mood and affect.  ASSESSMENT: Logan Benton is a 40 y.o. male presenting today with a history of epigastric/LUQ pain, N/V, weight loss now resolved, concern for worms in stool self-reported and treated numerous  times, evaluated by ID with negative stool tests, esophagitis, Barrett's esophagus documented on biopsy, returning with worsening dyspepsia.  Dyspepsia: likely multifactorial in setting of known gastritis, history of esophagitis, currently not on a PPI despite diagnosis of Barrett's. We discussed resuming this. CT on file last year and no acute need for this currently. He is interested in early interval EGD, which I feel would be low yield, but if no improvement with resuming PPI, then we will consider pursuing this. If EGD negative and persistent symptoms, could pursue CT.  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 Will trial course of Xifaxan X 2 weeks. This could also be helpful is SIBO, which patient has expressed as a concern. No need for colonoscopy as he has recently had one.   Weight loss: resolved.  Sore throat: query LPR. Resume PPI. As no odynophagia or dysphagia, holding off on empiric Diflucan.   PLAN:  Resume PPI daily Course of Xifaxan Progress report in 2 weeks Consider early interval EGD although likely low yield Consider imaging Return in 3 months  Annitta Needs, PhD, [facility]  Gastroenterology

## 2021-02-27 NOTE — Telephone Encounter (Signed)
Dena,  Did we get a PA for Xifaxan? Patient believes one may have been sent. I just sent it in today, so it may be too soon. He would like to stay updated on the process :)

## 2021-02-27 NOTE — Telephone Encounter (Signed)
Vicente Males Just got PA on [place] for Xifaxan and getting started, noted

## 2021-02-27 NOTE — Telephone Encounter (Signed)
Thanks

## 2021-02-27 NOTE — Progress Notes (Signed)
Referring Provider: Leonie Douglas, MD Primary Care Physician:  Lindell Spar, MD Primary GI: Dr. Abbey Chatters  Chief Complaint  Patient presents with  . Abdominal Pain    Upper abd pain, itching goes into back after eating  . Bloated  . white/yellow mucous in stool  . Diarrhea    occ    HPI:   Logan Benton is a 40 y.o. male presenting today with a history of epigastric pain, N/V, weight loss, concern for worms in stool (self-reported). Symptom onset Jan 2022 and treated empirically by PCP. O&P negative multiple times. Has seen ID and had additional tests.   Colonoscopy Sept 2021 with tubular adenoma, EGD with Grade C esophagitis, gastritis, small bowel unremarkable. Early interval EGD Nov 2021 with short segment Barrett's, 3 year surveillance recommended.   CT July 2021 with possible mild proctocolitis. Abd xray March 2022 with moderate stool in colon. Lipase normal, CMP normal, no anemia on CBC  Positive marijuana March 2022. Seen in ED in [facility] with complaints of "total body pain". ED physician ordered blood cultures due to his concern for system infection.   Intermittent diarrhea, yellow/white mucus. LUQ pain radiating around to back, with an itching sensation almost feeling like it fell asleep. Onset in last few weeks. Worsening epigastric/LUQ pain after eating. No NSAIDs. Has been off of PPI. Still has 20 mg omeprazole and will start taking that. Hurts with everything he eats. Worsening now.   Intermittent diarrhea, every other day, comes and goes. Not associated with other pain. No rectal bleeding. Has gained some weight. Appetite not great but force feeding self. Feels generally unwell.   Prior to onset in Jan 2022, would have normal BM once to three times per day. Diflucan prescribed per PCP and pt awaiting GI input first. Has a scratchy throat. For weeks. No fever. Tingling in mouth.    Past Medical History:  Diagnosis Date  . Abscessed tooth   . Anxiety   . C.  difficile diarrhea    presumptive diagnosis  . Depression   . Hypertension   . Trigger finger     Past Surgical History:  Procedure Laterality Date  . BILATERAL CARPAL TUNNEL RELEASE    . BIOPSY  06/29/2020   Procedure: BIOPSY;  Surgeon: Eloise Harman, DO;  Location: [facility];  Service: Endoscopy;;  . BIOPSY  09/03/2020   Procedure: BIOPSY;  Surgeon: Eloise Harman, DO;  Location: [facility];  Service: Endoscopy;;  . COLONOSCOPY WITH PROPOFOL N/A 06/29/2020   Dr. Abbey Chatters: 4 mm tubular adenoma removed, nonbleeding internal hemorrhoids.  Repeat colonoscopy in 7 years.  . ESOPHAGOGASTRODUODENOSCOPY (EGD) WITH PROPOFOL N/A 06/29/2020   Dr. Abbey Chatters: LA grade C esophagitis, gastritis with mild chronic gastritis on path, small bowel biopsy unremarkable.  . ESOPHAGOGASTRODUODENOSCOPY (EGD) WITH PROPOFOL N/A 09/03/2020   Dr. Abbey Chatters: Esophageal mucosal changes suggestive of short segment Barrett's esophagus, biopsies consistent.  Next EGD in 3 years  . POLYPECTOMY  06/29/2020   Procedure: POLYPECTOMY;  Surgeon: Eloise Harman, DO;  Location: [facility];  Service: Endoscopy;;  . SHOULDER SURGERY Right    x2  . TRIGGER FINGER RELEASE Right 2016    Current Outpatient Medications  Medication Sig Dispense Refill  . acetaminophen (TYLENOL) 500 MG tablet Take 500-1,000 mg by mouth as needed (for pain.).    Marland Kitchen baclofen (LIORESAL) 10 MG tablet Take 10 mg by mouth 2 (two) times daily.     . fluconazole (DIFLUCAN) 200 MG  tablet Take 1 tablet (200 mg total) by mouth daily. 7 tablet 0  . lisinopril (ZESTRIL) 5 MG tablet Take 5 mg by mouth at bedtime.    . Multiple Vitamins-Minerals (ADULT ONE DAILY GUMMIES PO) Take 2 tablets by mouth daily.    . Probiotic Product (PROBIOTIC-10 PO) Take by mouth in the morning and at bedtime.    . rifaximin (XIFAXAN) 550 MG TABS tablet Take 1 tablet (550 mg total) by mouth 3 (three) times daily for 14 days. 42 tablet 0   No current facility-administered  medications for this visit.    Allergies as of 02/27/2021  . (No Known Allergies)    Family History  Problem Relation Age of Onset  . Rheum arthritis Mother   . Bladder Cancer Mother   . Kidney disease Father   . Bladder Cancer Maternal Grandfather   . Irritable bowel syndrome Paternal Grandmother   . Colon cancer Neg Hx   . Celiac disease Neg Hx   . Inflammatory bowel disease Neg Hx     Social History   Socioeconomic History  . Marital status: Divorced    Spouse name: Not on file  . Number of children: Not on file  . Years of education: Not on file  . Highest education level: Not on file  Occupational History  . Not on file  Tobacco Use  . Smoking status: Former Smoker    Packs/day: 0.00    Years: 0.00    Pack years: 0.00    Quit date: 11/03/2019    Years since quitting: 1.3  . Smokeless tobacco: Never Used  . Tobacco comment: nicoten gum  Vaping Use  . Vaping Use: Former  . Quit date: 03/07/2019  Substance and Sexual Activity  . Alcohol use: No  . Drug use: Not Currently  . Sexual activity: Not Currently    Partners: Female  Other Topics Concern  . Not on file  Social History Narrative  . Not on file   Social Determinants of Health   Financial Resource Strain: Not on file  Food Insecurity: Not on file  Transportation Needs: Not on file  Physical Activity: Not on file  Stress: Not on file  Social Connections: Not on file    Review of Systems: Gen: Denies fever, chills, anorexia. Denies fatigue, weakness, weight loss.  CV: Denies chest pain, palpitations, syncope, peripheral edema, and claudication. Resp: Denies dyspnea at rest, cough, wheezing, coughing up blood, and pleurisy. GI: see HPI Derm: Denies rash, itching, dry skin Psych: Denies depression, anxiety, memory loss, confusion. No homicidal or suicidal ideation.  Heme: Denies bruising, bleeding, and enlarged lymph nodes.  Physical Exam: BP 131/76   Pulse (!) 58   Temp (!) 97.3 F (36.3 C)  (Temporal)   Ht 5\' 11"  (1.803 m)   Wt 149 lb 3.2 oz (67.7 kg)   BMI 20.81 kg/m  General:   Alert and oriented. No distress noted. Pleasant and cooperative.  Head:  Normocephalic and atraumatic. Eyes:  Conjuctiva clear without scleral icterus. Mouth:  Mask in place  Abdomen:  +BS, soft, non-tender and non-distended. No rebound or guarding. No HSM or masses noted. Msk:  Symmetrical without gross deformities. Normal posture. Extremities:  Without edema. Neurologic:  Alert and  oriented x4 Psych:  Alert and cooperative. Normal mood and affect.  ASSESSMENT: Logan Benton is a 40 y.o. male presenting today with a history of epigastric/LUQ pain, N/V, weight loss now resolved, concern for worms in stool self-reported and treated numerous  times, evaluated by ID with negative stool tests, esophagitis, Barrett's esophagus documented on biopsy, returning with worsening dyspepsia.  Dyspepsia: likely multifactorial in setting of known gastritis, history of esophagitis, currently not on a PPI despite diagnosis of Barrett's. We discussed resuming this. CT on file last year and no acute need for this currently. He is interested in early interval EGD, which I feel would be low yield, but if no improvement with resuming PPI, then we will consider pursuing this. If EGD negative and persistent symptoms, could pursue CT.  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 Will trial course of Xifaxan X 2 weeks. This could also be helpful is SIBO, which patient has expressed as a concern. No need for colonoscopy as he has recently had one.   Weight loss: resolved.  Sore throat: query LPR. Resume PPI. As no odynophagia or dysphagia, holding off on empiric Diflucan.   PLAN:  Resume PPI daily Course of Xifaxan Progress report in 2 weeks Consider early interval EGD although likely low yield Consider imaging Return in 3 months  Annitta Needs, PhD, [facility]  Gastroenterology

## 2021-02-27 NOTE — Patient Instructions (Signed)
I recommend resuming omeprazole (Prilosec) 20 milligrams daily, 30 minutes before breakfast. It's important to stay on this due to your history of Barrett's esophagus. I do believe you will notice improvement with some of your symptoms on this.   I have sent in Xifaxan to take three times a day for the next 2 weeks. You may need a prior authorization.  Please let us know in 2 weeks how you are doing. If no improvement with abdominal pain, we can pursue an endoscopy or even consider CT scan.  Let's hold of on Diflucan right now. If you have painful swallowing, let us know.  We will see you in 3 months!  It was a pleasure to see you today. I want to create trusting relationships with patients to provide genuine, compassionate, and quality care. I value your feedback. If you receive a survey regarding your visit,  I greatly appreciate you taking time to fill this out.   Annitta Needs, PhD, ANP-BC [facility] Gastroenterology

## 2021-02-27 NOTE — Progress Notes (Signed)
Cc'ed to pcp °

## 2021-03-01 ENCOUNTER — Encounter: Payer: Self-pay | Admitting: Internal Medicine

## 2021-03-01 ENCOUNTER — Other Ambulatory Visit: Payer: Self-pay | Admitting: Internal Medicine

## 2021-03-01 DIAGNOSIS — B80 Enterobiasis: Secondary | ICD-10-CM

## 2021-03-01 MED ORDER — MEBENDAZOLE 100 MG PO CHEW: CHEWABLE_TABLET | ORAL | 0 refills | Status: DC

## 2021-03-06 ENCOUNTER — Telehealth: Payer: Self-pay | Admitting: Gastroenterology

## 2021-03-06 ENCOUNTER — Other Ambulatory Visit: Payer: Self-pay | Admitting: Gastroenterology

## 2021-03-06 MED ORDER — NYSTATIN 100000 UNIT/ML MT SUSP: 5.0000 mL | Freq: Four times a day (QID) | OROMUCOSAL | 1 refills | Status: AC

## 2021-03-06 NOTE — Telephone Encounter (Signed)
Called pt. He has been scheduled for 6/9 at 1:00pm. Patient aware will send instructions via mychart. He voiced understanding.

## 2021-03-06 NOTE — Telephone Encounter (Signed)
[facility] clinical pool:  Please arrange EGD for patient due to dyspepsia with Dr. Abbey Chatters. ASA 2.

## 2021-03-06 NOTE — Telephone Encounter (Signed)
Called pt. Dr. Abbey Chatters had cancellation for Friday at 11:00am. He was agreeable to having procedure then. New instructions sent and endo aware

## 2021-03-08 ENCOUNTER — Encounter (HOSPITAL_COMMUNITY)
Admission: RE | Disposition: A | Discharge status: Home / Self Care | Payer: Self-pay | Source: Home / Self Care | Attending: Internal Medicine

## 2021-03-08 ENCOUNTER — Encounter (HOSPITAL_COMMUNITY): Payer: Self-pay

## 2021-03-08 ENCOUNTER — Ambulatory Visit (HOSPITAL_COMMUNITY): Payer: 59 | Admitting: Anesthesiology

## 2021-03-08 ENCOUNTER — Ambulatory Visit (HOSPITAL_COMMUNITY)
Admission: RE | Admit: 2021-03-08 | Discharge: 2021-03-08 | Disposition: A | Discharge status: Home / Self Care | Payer: 59 | Attending: Internal Medicine | Admitting: Internal Medicine

## 2021-03-08 ENCOUNTER — Other Ambulatory Visit: Payer: Self-pay

## 2021-03-08 DIAGNOSIS — K2270 Barrett's esophagus without dysplasia: Secondary | ICD-10-CM | POA: Insufficient documentation

## 2021-03-08 DIAGNOSIS — Z79899 Other long term (current) drug therapy: Secondary | ICD-10-CM | POA: Diagnosis not present

## 2021-03-08 DIAGNOSIS — Z8052 Family history of malignant neoplasm of bladder: Secondary | ICD-10-CM | POA: Insufficient documentation

## 2021-03-08 DIAGNOSIS — K2970 Gastritis, unspecified, without bleeding: Secondary | ICD-10-CM | POA: Diagnosis not present

## 2021-03-08 DIAGNOSIS — Z8261 Family history of arthritis: Secondary | ICD-10-CM | POA: Insufficient documentation

## 2021-03-08 DIAGNOSIS — R1013 Epigastric pain: Secondary | ICD-10-CM | POA: Diagnosis present

## 2021-03-08 DIAGNOSIS — Z8051 Family history of malignant neoplasm of kidney: Secondary | ICD-10-CM | POA: Insufficient documentation

## 2021-03-08 DIAGNOSIS — Z87891 Personal history of nicotine dependence: Secondary | ICD-10-CM | POA: Insufficient documentation

## 2021-03-08 DIAGNOSIS — Z8379 Family history of other diseases of the digestive system: Secondary | ICD-10-CM | POA: Diagnosis not present

## 2021-03-08 DIAGNOSIS — K449 Diaphragmatic hernia without obstruction or gangrene: Secondary | ICD-10-CM | POA: Insufficient documentation

## 2021-03-08 HISTORY — PX: BIOPSY: SHX5522

## 2021-03-08 HISTORY — PX: ESOPHAGOGASTRODUODENOSCOPY (EGD) WITH PROPOFOL: SHX5813

## 2021-03-08 LAB — KOH PREP: KOH Prep: NONE SEEN

## 2021-03-08 SURGERY — ESOPHAGOGASTRODUODENOSCOPY (EGD) WITH PROPOFOL
Anesthesia: General

## 2021-03-08 MED ORDER — LIDOCAINE HCL (CARDIAC) PF 100 MG/5ML IV SOSY
PREFILLED_SYRINGE | INTRAVENOUS | Status: DC | PRN
  Administered 2021-03-08: 40 mg via INTRATRACHEAL

## 2021-03-08 MED ORDER — STERILE WATER FOR IRRIGATION IR SOLN
Status: DC | PRN
  Administered 2021-03-08: 1.5 mL

## 2021-03-08 MED ORDER — LACTATED RINGERS IV SOLN: INTRAVENOUS | Status: DC

## 2021-03-08 MED ORDER — PROPOFOL 10 MG/ML IV BOLUS
INTRAVENOUS | Status: DC | PRN
  Administered 2021-03-08 (×2): 20 mg via INTRAVENOUS
  Administered 2021-03-08: 30 mg via INTRAVENOUS
  Administered 2021-03-08: 20 mg via INTRAVENOUS
  Administered 2021-03-08: 150 mg via INTRAVENOUS
  Administered 2021-03-08 (×3): 20 mg via INTRAVENOUS

## 2021-03-08 MED ORDER — FLUCONAZOLE 200 MG PO TABS: 200.0000 mg | ORAL_TABLET | Freq: Every day | ORAL | 0 refills | Status: DC

## 2021-03-08 NOTE — Anesthesia Preprocedure Evaluation (Signed)
Anesthesia Evaluation  Patient identified by MRN, date of birth, ID band Patient awake    Reviewed: Allergy & Precautions, H&P , NPO status , Patient's Chart, lab work & pertinent test results, reviewed documented beta blocker date and time   Airway Mallampati: II  TM Distance: >3 FB Neck ROM: full    Dental no notable dental hx.    Pulmonary neg pulmonary ROS, former smoker,    Pulmonary exam normal breath sounds clear to auscultation       Cardiovascular Exercise Tolerance: Good hypertension, negative cardio ROS   Rhythm:regular Rate:Normal     Neuro/Psych PSYCHIATRIC DISORDERS Anxiety Depression negative neurological ROS     GI/Hepatic negative GI ROS, Neg liver ROS,   Endo/Other  negative endocrine ROS  Renal/GU negative Renal ROS  negative genitourinary   Musculoskeletal   Abdominal   Peds  Hematology negative hematology ROS (+)   Anesthesia Other Findings   Reproductive/Obstetrics negative OB ROS                             Anesthesia Physical  Anesthesia Plan  ASA: II  Anesthesia Plan: General   Post-op Pain Management:    Induction:   PONV Risk Score and Plan: Propofol infusion  Airway Management Planned:   Additional Equipment:   Intra-op Plan:   Post-operative Plan:   Informed Consent: I have reviewed the patients History and Physical, chart, labs and discussed the procedure including the risks, benefits and alternatives for the proposed anesthesia with the patient or authorized representative who has indicated his/her understanding and acceptance.     Dental Advisory Given  Plan Discussed with: CRNA  Anesthesia Plan Comments:         Anesthesia Quick Evaluation

## 2021-03-08 NOTE — Op Note (Signed)
[facility] Patient Name: Logan Benton Procedure Date: 03/08/2021 10:10 AM MRN: [id number] Date of Birth: 1981/02/09 Attending MD: Elon Alas. Abbey Chatters DO CSN: [id number] Age: 40 Admit Type: Outpatient Procedure:                Upper GI endoscopy Indications:              Epigastric abdominal pain, Heartburn Providers:                Elon Alas. Abbey Chatters, DO, Tacy Learn,                            Technician Referring MD:              Medicines:                See the Anesthesia note for documentation of the                            administered medications Complications:            No immediate complications. Estimated Blood Loss:     Estimated blood loss was minimal. Procedure:                Pre-Anesthesia Assessment:                           - The anesthesia plan was to use monitored                            anesthesia care (MAC).                           After obtaining informed consent, the endoscope was                            passed under direct vision. Throughout the                            procedure, the patient's blood pressure, pulse, and                            oxygen saturations were monitored continuously. The                            GIF-H190 ([id number]) scope was introduced through the                            mouth, and advanced to the second part of duodenum.                            The upper GI endoscopy was accomplished without                            difficulty. The patient tolerated the procedure                            well. Scope In: 10:26:24 AM  Scope Out: 10:31:40 AM Total Procedure Duration: 0 hours 5 minutes 16 seconds  Findings:      A small hiatal hernia was present.      Cells for cytology were obtained by brushing in the middle third of the       esophagus.      Biopsies were taken with a cold forceps in the middle third of the       esophagus for histology.      Patchy mild inflammation characterized by  erythema was found in the       entire examined stomach. Biopsies were taken with a cold forceps for       Helicobacter pylori testing.      The ampulla, duodenal bulb, first portion of the duodenum and second       portion of the duodenum were normal.      There were esophageal mucosal changes secondary to established       short-segment Barrett's disease present in the lower third of the       esophagus. The maximum longitudinal extent of these mucosal changes was       2 cm in length. Impression:               - Small hiatal hernia.                           - Gastritis. Biopsied.                           - Normal ampulla, duodenal bulb, first portion of                            the duodenum and second portion of the duodenum.                           - Esophageal mucosal changes secondary to                            established short-segment Barrett's disease.                           - Cells for cytology obtained in the middle third                            of the esophagus.                           - Biopsies were taken with a cold forceps for                            histology in the middle third of the esophagus. Moderate Sedation:      Per Anesthesia Care Recommendation:           - Patient has a contact number available for                            emergencies. The signs and symptoms of potential  delayed complications were discussed with the                            patient. Return to normal activities tomorrow.                            Written discharge instructions were provided to the                            patient.                           - Resume previous diet.                           - Continue present medications.                           - Await pathology results.                           - Return to GI clinic as previously scheduled. Procedure Code(s):        --- Professional ---                           [phone],  Esophagogastroduodenoscopy, flexible,                            transoral; with biopsy, single or multiple Diagnosis Code(s):        --- Professional ---                           K44.9, Diaphragmatic hernia without obstruction or                            gangrene                           K29.70, Gastritis, unspecified, without bleeding                           R10.13, Epigastric pain                           R12, Heartburn CPT copyright 2019 American Medical Association. All rights reserved. The codes documented in this report are preliminary and upon coder review may  be revised to meet current compliance requirements. Elon Alas. Abbey Chatters, DO [place] Abbey Chatters, DO 03/08/2021 10:36:11 AM This report has been signed electronically. Number of Addenda: 0

## 2021-03-08 NOTE — Discharge Instructions (Addendum)
EGD Discharge instructions Please read the instructions outlined below and refer to this sheet in the next few weeks. These discharge instructions provide you with general information on caring for yourself after you leave the hospital. Your doctor may also give you specific instructions. While your treatment has been planned according to the most current medical practices available, unavoidable complications occasionally occur. If you have any problems or questions after discharge, please call your doctor. ACTIVITY  You may resume your regular activity but move at a slower pace for the next 24 hours.   Take frequent rest periods for the next 24 hours.   Walking will help expel (get rid of) the air and reduce the bloated feeling in your abdomen.   No driving for 24 hours (because of the anesthesia (medicine) used during the test).   You may shower.   Do not sign any important legal documents or operate any machinery for 24 hours (because of the anesthesia used during the test).  NUTRITION  Drink plenty of fluids.   You may resume your normal diet.   Begin with a light meal and progress to your normal diet.   Avoid alcoholic beverages for 24 hours or as instructed by your caregiver.  MEDICATIONS  You may resume your normal medications unless your caregiver tells you otherwise.  WHAT YOU CAN EXPECT TODAY  You may experience abdominal discomfort such as a feeling of fullness or "gas" pains.  FOLLOW-UP  Your doctor will discuss the results of your test with you.  SEEK IMMEDIATE MEDICAL ATTENTION IF ANY OF THE FOLLOWING OCCUR:  Excessive nausea (feeling sick to your stomach) and/or vomiting.   Severe abdominal pain and distention (swelling).   Trouble swallowing.   Temperature over 101 F (37.8 C).   Rectal bleeding or vomiting of blood.    Your EGD showed previously noted Barrett's esophagus.  I did take samples of your esophagus to rule out candidal esophagitis.  I also  took biopsies of your stomach to rule out infection with bacteria called H. pylori.  Await pathology results, my office will contact you next week.  Continue on omeprazole.  Follow-up with GI as previously scheduled.  I hope you have a great rest of your week!  Elon Alas. Abbey Chatters, D.O. Gastroenterology and Hepatology [facility]

## 2021-03-08 NOTE — Anesthesia Postprocedure Evaluation (Signed)
Anesthesia Post Note  Patient: Logan Benton  Procedure(s) Performed: ESOPHAGOGASTRODUODENOSCOPY (EGD) WITH PROPOFOL (N/A ) BIOPSY  Patient location during evaluation: Phase II Anesthesia Type: General Level of consciousness: awake Pain management: pain level controlled Vital Signs Assessment: post-procedure vital signs reviewed and stable Respiratory status: spontaneous breathing and respiratory function stable Cardiovascular status: blood pressure returned to baseline and stable Postop Assessment: no headache and no apparent nausea or vomiting Anesthetic complications: no Comments: Late entry   No complications documented.   Last Vitals:  Vitals:   03/08/21 0923 03/08/21 1035  BP: 132/76 (!) 112/54  Pulse: 77   Resp: 13 16  Temp:  (!) 36.3 C  SpO2: 99% 98%    Last Pain:  Vitals:   03/08/21 1035  TempSrc: Oral  PainSc: 0-No pain                 Louann Sjogren

## 2021-03-08 NOTE — Interval H&P Note (Signed)
History and Physical Interval Note:  03/08/2021 10:15 AM  Logan Benton  has presented today for surgery, with the diagnosis of dyspepsia.  The various methods of treatment have been discussed with the patient and family. After consideration of risks, benefits and other options for treatment, the patient has consented to  Procedure(s) with comments: ESOPHAGOGASTRODUODENOSCOPY (EGD) WITH PROPOFOL (N/A) - 1:00pm as a surgical intervention.  The patient's history has been reviewed, patient examined, no change in status, stable for surgery.  I have reviewed the patient's chart and labs.  Questions were answered to the patient's satisfaction.     Eloise Harman

## 2021-03-08 NOTE — Transfer of Care (Signed)
Immediate Anesthesia Transfer of Care Note  Patient: Logan Benton  Procedure(s) Performed: ESOPHAGOGASTRODUODENOSCOPY (EGD) WITH PROPOFOL (N/A ) BIOPSY  Patient Location: Endoscopy Unit  Anesthesia Type:MAC  Level of Consciousness: sedated and responds to stimulation  Airway & Oxygen Therapy: Patient Spontanous Breathing and Patient connected to face mask oxygen  Post-op Assessment: Report given to RN, Post -op Vital signs reviewed and stable and Patient moving all extremities  Post vital signs: Reviewed and stable  Last Vitals:  Vitals Value Taken Time  BP    Temp    Pulse    Resp    SpO2      Last Pain:  Vitals:   03/08/21 1021  TempSrc:   PainSc: 5       Patients Stated Pain Goal: 8 (26/20/35 5974)  Complications: No complications documented.

## 2021-03-11 LAB — SURGICAL PATHOLOGY

## 2021-03-12 ENCOUNTER — Other Ambulatory Visit (HOSPITAL_COMMUNITY): Payer: 59

## 2021-03-13 ENCOUNTER — Telehealth: Payer: Self-pay

## 2021-03-13 ENCOUNTER — Encounter: Payer: Self-pay | Admitting: Internal Medicine

## 2021-03-13 NOTE — Telephone Encounter (Signed)
Patient called asking what should he do about his chest pain-muscle pain. He was up all night. He said he knows its not urgent where he needs to to go to ER, can you give patient call back at [phone].

## 2021-03-13 NOTE — Telephone Encounter (Signed)
Pt advised to go to ER or urgent care as there was no available openings until next week. Pt stated he didn't need to go there it was not that severe and he would just wait until next week when he could see patel advised pt if it got worse that he would need to go to urgent care or ER with verbal understanding

## 2021-03-14 ENCOUNTER — Telehealth: Payer: Self-pay | Admitting: Gastroenterology

## 2021-03-14 ENCOUNTER — Telehealth: Payer: Self-pay | Admitting: *Deleted

## 2021-03-14 DIAGNOSIS — R079 Chest pain, unspecified: Secondary | ICD-10-CM

## 2021-03-14 NOTE — Telephone Encounter (Signed)
[facility] clinical pool:  Please refer to Cardiology due to chest pain.  Also, please arrange CT abd/pelvis with oral contrast only, reason: abdominal pain. Thanks!

## 2021-03-14 NOTE — Telephone Encounter (Signed)
Noted, I have sent patient back a patient advice message

## 2021-03-14 NOTE — Telephone Encounter (Signed)
Pt advice request sent 03/13/21: Good afternoon,  I have been experiencing pretty severe throbbing pain right in the heart area, still,  accompanied by jaw tension and head pressure. It continues to get worse. My primary cannot see me until the 16th, and honestly don't seem to be concerned in the slightest.  I know i have been a bit of a hypochondriac in the past, given, but these symptoms are very real and interfering with my daily life. I mentioned this pain on the day of my procedure as well.  I suppose I should have been more adamant. I have no way of knowing what's causing what, if it's heart, nerve, muscle, etc. I'm still getting the internal itching as well which is odd on its own. I would like to get a CT of the symptomatic area ordered if that is something your office can do. The symptoms vary in intensity at different times, but always present.  I'm trying to stay away from an ER, as it's expensive,  and because it's not acutely life threatening,  I won't get much help. Thank yall so much for everything,  and actually listening. I'm aware I'm a bit much as a patient, but I do need some help here. Thank you!  Pt advice request sent 03/14/21: Good afternoon,  I was wondering if it would be possible to have a phone call when convenient, to explain my Endoscopy  results before  the weekend and discuss a possible CT scan as mentioned in my previous message, or if I need to go to the ER as I can't even afford an urgentcare visit until the middle of next week. My PCP is out of the office until the 16th, and the other MD's on staff are booked solid and will not see me. I'm still experiencing a lot of pain in the same area. If I try to exercise or any kind of physical activity it gets worse.  This  is the same pain I was having the day of my procedure,  but constant and more intense. Thank you and have a good evening. ----   Please advise Cyril Mourning as Vicente Males is off today. Thank you!

## 2021-03-14 NOTE — Telephone Encounter (Signed)
Will await for patient to respond back if he wants to do this below or if he is going to the ED

## 2021-03-14 NOTE — Telephone Encounter (Signed)
See separate message from Roseanne Kaufman today.  She recommended cardiology referral and arranging CT.  Additionally, if the patient is having severe chest pain that is persistent, he needs to present to the emergency room for further evaluation.

## 2021-03-14 NOTE — Telephone Encounter (Signed)
noted 

## 2021-03-15 NOTE — Telephone Encounter (Signed)
noted 

## 2021-03-15 NOTE — Telephone Encounter (Signed)
Phoned the pt advised of his pathology report being negative and to followup as previously scheduled.

## 2021-03-15 NOTE — Addendum Note (Signed)
Addended by: Cheron Every on: 03/15/2021 07:45 AM   Modules accepted: Orders

## 2021-03-15 NOTE — Telephone Encounter (Signed)
Referral placed to cardiology. Message sent to [facility] regarding CT. Pt wants done of heart. Stated not having any GI symptoms at this time

## 2021-03-18 ENCOUNTER — Telehealth: Payer: Self-pay | Admitting: Gastroenterology

## 2021-03-18 ENCOUNTER — Encounter (HOSPITAL_COMMUNITY): Payer: Self-pay | Admitting: Internal Medicine

## 2021-03-18 DIAGNOSIS — R109 Unspecified abdominal pain: Secondary | ICD-10-CM

## 2021-03-18 NOTE — Telephone Encounter (Signed)
Noted. Called central scheduling and had appt changed to order with CT ABD WO.

## 2021-03-18 NOTE — Telephone Encounter (Signed)
Yes, no IV contrast, only oral.

## 2021-03-18 NOTE — Telephone Encounter (Signed)
CT scheduled for 7/6 at 8:00am, arrival 7:45am, npo midnight. Pick up oral contrast. Pt message sent in mychart

## 2021-03-18 NOTE — Addendum Note (Signed)
Addended by: Cheron Every on: 03/18/2021 10:39 AM   Modules accepted: Orders

## 2021-03-18 NOTE — Addendum Note (Signed)
Addended by: Cheron Every on: 03/18/2021 09:25 AM   Modules accepted: Orders

## 2021-03-18 NOTE — Telephone Encounter (Signed)
Do you want w/o contrast d/t shortage?

## 2021-03-18 NOTE — Telephone Encounter (Signed)
Called [facility] to start PA for CT. Requires clinical review. PA rep transferred to [facility] for peer-to-peer

## 2021-03-18 NOTE — Telephone Encounter (Signed)
[facility] clinical pool:  Please arrange CT abd/pelvis due to abdominal pain for patient. Thanks!

## 2021-03-18 NOTE — Telephone Encounter (Signed)
Approval obtained but only for CT ABDOMEN (not pelvis).   Authorization 6/13-7/12. #[id number]

## 2021-03-19 ENCOUNTER — Other Ambulatory Visit: Payer: Self-pay | Admitting: Gastroenterology

## 2021-03-19 DIAGNOSIS — B839 Helminthiasis, unspecified: Secondary | ICD-10-CM

## 2021-03-19 NOTE — Progress Notes (Signed)
Lab form faxed to [facility] @ [phone].

## 2021-03-19 NOTE — Progress Notes (Signed)
Dena: I have ordered an O&P stool test for [facility]. Patient aware. Can we fax it there?  Thanks!

## 2021-03-21 ENCOUNTER — Telehealth: Payer: Self-pay | Admitting: Internal Medicine

## 2021-03-21 ENCOUNTER — Ambulatory Visit (INDEPENDENT_AMBULATORY_CARE_PROVIDER_SITE_OTHER): Payer: 59 | Admitting: Internal Medicine

## 2021-03-21 ENCOUNTER — Encounter: Payer: Self-pay | Admitting: Internal Medicine

## 2021-03-21 ENCOUNTER — Other Ambulatory Visit: Payer: Self-pay

## 2021-03-21 VITALS — BP 144/79 | HR 79 | Temp 98.4°F | Resp 18 | Ht 71.0 in | Wt 153.4 lb

## 2021-03-21 DIAGNOSIS — Z79899 Other long term (current) drug therapy: Secondary | ICD-10-CM | POA: Insufficient documentation

## 2021-03-21 DIAGNOSIS — E0590 Thyrotoxicosis, unspecified without thyrotoxic crisis or storm: Secondary | ICD-10-CM | POA: Insufficient documentation

## 2021-03-21 DIAGNOSIS — R079 Chest pain, unspecified: Secondary | ICD-10-CM | POA: Diagnosis not present

## 2021-03-21 DIAGNOSIS — G4700 Insomnia, unspecified: Secondary | ICD-10-CM | POA: Insufficient documentation

## 2021-03-21 DIAGNOSIS — R197 Diarrhea, unspecified: Secondary | ICD-10-CM | POA: Diagnosis not present

## 2021-03-21 DIAGNOSIS — F419 Anxiety disorder, unspecified: Secondary | ICD-10-CM | POA: Insufficient documentation

## 2021-03-21 DIAGNOSIS — F17200 Nicotine dependence, unspecified, uncomplicated: Secondary | ICD-10-CM | POA: Insufficient documentation

## 2021-03-21 DIAGNOSIS — B719 Cestode infection, unspecified: Secondary | ICD-10-CM | POA: Insufficient documentation

## 2021-03-21 DIAGNOSIS — Z841 Family history of disorders of kidney and ureter: Secondary | ICD-10-CM | POA: Insufficient documentation

## 2021-03-21 NOTE — Telephone Encounter (Signed)
Recall for ultrasound 

## 2021-03-21 NOTE — Progress Notes (Signed)
Acute Office Visit  Subjective:    Patient ID: Logan Benton, male    DOB: [birth year], 40 y.o.   MRN: [id number]  Chief Complaint  Patient presents with   Chest Pain    Pt has been having chest pain started around 4 weeks ago finished flucanazole has gotten better since finishing the medication     HPI Patient is in today for evaluation of chest pain, which is present for last 4 weeks. He states that the pain is on left upper chest wall area, is like needle-prick, currently nonradiating. Denies any dyspnea or palpitations. He has been referred to Cardiology by her GI provider. He states that his pain was worse while he was taking Fluconazole.  Past Medical History:  Diagnosis Date   Abscessed tooth    Anxiety    C. difficile diarrhea    presumptive diagnosis   Depression    Hypertension    Trigger finger     Past Surgical History:  Procedure Laterality Date   BILATERAL CARPAL TUNNEL RELEASE     BIOPSY  06/29/2020   Procedure: BIOPSY;  Surgeon: Eloise Harman, DO;  [facility];  Service: Endoscopy;;   BIOPSY  09/03/2020   Procedure: BIOPSY;  Surgeon: Eloise Harman, DO;  [facility];  Service: Endoscopy;;   BIOPSY  03/08/2021   Procedure: BIOPSY;  Surgeon: Eloise Harman, DO;  [facility];  Service: Endoscopy;;   COLONOSCOPY WITH PROPOFOL N/A 06/29/2020   Dr. Abbey Chatters: 4 mm tubular adenoma removed, nonbleeding internal hemorrhoids.  Repeat colonoscopy in 7 years.   ESOPHAGOGASTRODUODENOSCOPY (EGD) WITH PROPOFOL N/A 06/29/2020   Dr. Abbey Chatters: LA grade C esophagitis, gastritis with mild chronic gastritis on path, small bowel biopsy unremarkable.   ESOPHAGOGASTRODUODENOSCOPY (EGD) WITH PROPOFOL N/A 09/03/2020   Dr. Abbey Chatters: Esophageal mucosal changes suggestive of short segment Barrett's esophagus, biopsies consistent.  Next EGD in 3 years   ESOPHAGOGASTRODUODENOSCOPY (EGD) WITH PROPOFOL N/A 03/08/2021   Procedure:  ESOPHAGOGASTRODUODENOSCOPY (EGD) WITH PROPOFOL;  Surgeon: Eloise Harman, DO;  [facility];  Service: Endoscopy;  Laterality: N/A;  1:00pm   POLYPECTOMY  06/29/2020   Procedure: POLYPECTOMY;  Surgeon: Eloise Harman, DO;  [facility];  Service: Endoscopy;;   SHOULDER SURGERY Right    x2   TRIGGER FINGER RELEASE Right 2016    Family History  Problem Relation Age of Onset   Rheum arthritis Mother    Bladder Cancer Mother    Kidney disease Father    Bladder Cancer Maternal Grandfather    Irritable bowel syndrome Paternal Grandmother    Colon cancer Neg Hx    Celiac disease Neg Hx    Inflammatory bowel disease Neg Hx     Social History   Socioeconomic History   Marital status: Divorced    Spouse name: Not on file   Number of children: Not on file   Years of education: Not on file   Highest education level: Not on file  Occupational History   Not on file  Tobacco Use   Smoking status: Former    Packs/day: 0.00    Years: 0.00    Pack years: 0.00    Types: Cigarettes    Quit date: 11/03/2019    Years since quitting: 1.3   Smokeless tobacco: Never   Tobacco comments:    nicoten gum  Vaping Use   Vaping Use: Former   Quit date: 03/07/2019  Substance and Sexual Activity  Alcohol use: No   Drug use: Not Currently   Sexual activity: Not Currently    Partners: Female  Other Topics Concern   Not on file  Social History Narrative   Not on file   Social Determinants of Health   Financial Resource Strain: Not on file  Food Insecurity: Not on file  Transportation Needs: Not on file  Physical Activity: Not on file  Stress: Not on file  Social Connections: Not on file  Intimate Partner Violence: Not on file    Outpatient Medications Prior to Visit  Medication Sig Dispense Refill   baclofen (LIORESAL) 10 MG tablet Take 10 mg by mouth 2 (two) times daily.      lisinopril (ZESTRIL) 5 MG tablet Take 5 mg by mouth at bedtime.     Multiple  Vitamins-Minerals (ADULT ONE DAILY GUMMIES PO) Take 2 tablets by mouth in the morning.     omeprazole (PRILOSEC) 20 MG capsule Take 20 mg by mouth in the morning.     Probiotic Product (PROBIOTIC-10 PO) Take 1 capsule by mouth in the morning and at bedtime.     No facility-administered medications prior to visit.    No Known Allergies  Review of Systems  Constitutional:  Negative for chills and fever.  HENT:  Negative for congestion and sore throat.   Eyes:  Negative for pain and discharge.  Respiratory:  Negative for cough and shortness of breath.   Cardiovascular:  Positive for chest pain. Negative for palpitations.  Gastrointestinal:  Positive for abdominal pain and diarrhea. Negative for constipation, nausea and vomiting.  Endocrine: Negative for polydipsia and polyuria.  Genitourinary:  Negative for dysuria and hematuria.  Musculoskeletal:  Negative for neck pain and neck stiffness.  Skin:  Negative for rash.  Neurological:  Negative for dizziness, weakness, numbness and headaches.  Psychiatric/Behavioral:  Negative for agitation and behavioral problems.       Objective:    Physical Exam Vitals reviewed.  Constitutional:      General: He is not in acute distress.    Appearance: He is not diaphoretic.  HENT:     Head: Normocephalic and atraumatic.     Nose: Nose normal.     Mouth/Throat:     Mouth: Mucous membranes are moist.  Eyes:     General: No scleral icterus.    Extraocular Movements: Extraocular movements intact.  Cardiovascular:     Rate and Rhythm: Normal rate and regular rhythm.     Pulses: Normal pulses.     Heart sounds: Normal heart sounds. No murmur heard. Pulmonary:     Breath sounds: Normal breath sounds. No wheezing or rales.  Chest:     Chest wall: No mass or tenderness.  Abdominal:     Palpations: Abdomen is soft.     Tenderness: There is no abdominal tenderness.  Musculoskeletal:     Cervical back: Neck supple. No tenderness.     Right lower  leg: No edema.     Left lower leg: No edema.  Skin:    General: Skin is warm.     Findings: No rash.  Neurological:     General: No focal deficit present.     Mental Status: He is alert and oriented to person, place, and time.  Psychiatric:        Mood and Affect: Mood normal.        Behavior: Behavior normal.    BP (!) 144/79 (BP Location: Left Arm, Patient Position: Sitting, Cuff Size: Normal)  Pulse 79   Temp 98.4 F (36.9 C) (Oral)   Resp 18   Ht 5\' 11"  (1.803 m)   Wt 153 lb 6.4 oz (69.6 kg)   SpO2 97%   BMI 21.39 kg/m  Wt Readings from Last 3 Encounters:  03/21/21 153 lb 6.4 oz (69.6 kg)  03/08/21 149 lb 3.2 oz (67.7 kg)  02/27/21 149 lb 3.2 oz (67.7 kg)    Health Maintenance Due  Topic Date Due   HIV Screening  Never done   Hepatitis C Screening  Never done   COVID-19 Vaccine (3 - Booster for Moderna series) 09/10/2020    There are no preventive care reminders to display for this patient.   Lab Results  Component Value Date   TSH 0.720 10/21/2019   Lab Results  Component Value Date   WBC 5.4 02/11/2021   HGB 15.9 02/11/2021   HCT 47.9 02/11/2021   MCV 97.6 02/11/2021   PLT 243 02/11/2021   Lab Results  Component Value Date   NA 137 02/11/2021   K 4.6 02/11/2021   CO2 29 02/11/2021   GLUCOSE 109 (H) 02/11/2021   BUN 10 02/11/2021   CREATININE 0.93 02/11/2021   BILITOT 0.8 02/11/2021   ALKPHOS 40 02/11/2021   AST 23 02/11/2021   ALT 33 02/11/2021   PROT 7.1 02/11/2021   ALBUMIN 4.4 02/11/2021   CALCIUM 9.3 02/11/2021   ANIONGAP 7 02/11/2021   No results found for: CHOL No results found for: HDL No results found for: LDLCALC No results found for: TRIG No results found for: CHOLHDL No results found for: HGBA1C     Assessment & Plan:   Problem List Items Addressed This Visit     Chest pain, unspecified type    -  Primary Unclear etiology currently Not reproducible, but location suggests costochondritis vs pectoralis muscle  strain Could be related to anxiety/stress EKG: Sinus arrhythmia. HR 67. Incomplete RBBB. Similar to previous EKG. Has been referred to Cardiology Has had Echo, stress test and cardiac event monitor - unremarkable in the past     Diarrhea Check BMP to r/o electrolyte imbalance Proper hydration F/u with GI   No orders of the defined types were placed in this encounter.    Lindell Spar, MD

## 2021-03-21 NOTE — Telephone Encounter (Signed)
Pt scheduled for CT 7/6. Will pt needs [place]? thanks

## 2021-03-22 ENCOUNTER — Telehealth: Payer: Self-pay

## 2021-03-22 NOTE — Telephone Encounter (Signed)
Faxed lab form to [place] again for pt to have stool studies done.

## 2021-03-22 NOTE — Addendum Note (Signed)
Addended by: Metro Kung on: 03/22/2021 10:11 AM   Modules accepted: Orders

## 2021-03-23 LAB — BASIC METABOLIC PANEL
BUN/Creatinine Ratio: 18 (ref 9–20)
BUN: 18 mg/dL (ref 6–20)
CO2: 24 mmol/L (ref 20–29)
Calcium: 10.2 mg/dL (ref 8.7–10.2)
Chloride: 100 mmol/L (ref 96–106)
Creatinine, Ser: 1.01 mg/dL (ref 0.76–1.27)
Glucose: 102 mg/dL — ABNORMAL HIGH (ref 65–99)
Potassium: 4.6 mmol/L (ref 3.5–5.2)
Sodium: 140 mmol/L (ref 134–144)
eGFR: 98 mL/min/{1.73_m2} (ref >59)

## 2021-03-25 NOTE — Telephone Encounter (Signed)
Recall mailed 

## 2021-03-25 NOTE — Telephone Encounter (Signed)
Well, gallbladder polyp may not be appreciated on CT scan, so an [place] would still be warranted, unfortunately.

## 2021-03-28 ENCOUNTER — Telehealth: Payer: Self-pay | Admitting: Gastroenterology

## 2021-03-28 DIAGNOSIS — K824 Cholesterolosis of gallbladder: Secondary | ICD-10-CM

## 2021-03-28 LAB — OVA AND PARASITE EXAMINATION

## 2021-03-28 NOTE — Addendum Note (Signed)
Addended by: Zara Council C on: 03/28/2021 11:53 AM   Modules accepted: Orders

## 2021-03-28 NOTE — Telephone Encounter (Signed)
PATIENT RECEIVED LETTER TO SCHEDULE HIS ULTRASOUND  

## 2021-03-29 NOTE — Telephone Encounter (Signed)
[place] abd ruq scheduled for 04/22/21 at 9:30am, arrive at 8:15am. NPO after midnight before test. Letter mailed. MyChart message sent to pt.

## 2021-04-10 ENCOUNTER — Ambulatory Visit (HOSPITAL_COMMUNITY)
Admission: RE | Admit: 2021-04-10 | Discharge: 2021-04-10 | Disposition: A | Discharge status: Home / Self Care | Payer: 59 | Source: Ambulatory Visit | Attending: Gastroenterology | Admitting: Gastroenterology

## 2021-04-10 DIAGNOSIS — R109 Unspecified abdominal pain: Secondary | ICD-10-CM | POA: Insufficient documentation

## 2021-04-13 ENCOUNTER — Encounter: Payer: Self-pay | Admitting: Internal Medicine

## 2021-04-16 ENCOUNTER — Encounter: Payer: Self-pay | Admitting: Family Medicine

## 2021-04-16 ENCOUNTER — Telehealth: Payer: Self-pay

## 2021-04-16 ENCOUNTER — Encounter: Payer: Self-pay | Admitting: Internal Medicine

## 2021-04-16 ENCOUNTER — Other Ambulatory Visit: Payer: Self-pay

## 2021-04-16 ENCOUNTER — Telehealth (INDEPENDENT_AMBULATORY_CARE_PROVIDER_SITE_OTHER): Payer: 59 | Admitting: Family Medicine

## 2021-04-16 DIAGNOSIS — R197 Diarrhea, unspecified: Secondary | ICD-10-CM | POA: Diagnosis not present

## 2021-04-16 DIAGNOSIS — I10 Essential (primary) hypertension: Secondary | ICD-10-CM

## 2021-04-16 DIAGNOSIS — B377 Candidal sepsis: Secondary | ICD-10-CM | POA: Diagnosis not present

## 2021-04-16 DIAGNOSIS — B370 Candidal stomatitis: Secondary | ICD-10-CM

## 2021-04-16 MED ORDER — ITRACONAZOLE 200 MG PO TABS: ORAL_TABLET | ORAL | 0 refills | Status: DC

## 2021-04-16 MED ORDER — ITRACONAZOLE 100 MG PO CAPS: 100.0000 mg | ORAL_CAPSULE | Freq: Every day | ORAL | 0 refills | Status: DC

## 2021-04-16 NOTE — Telephone Encounter (Signed)
Patient called said the pharmacy needs a prior authorization on medication itraconazole (SPORANOX) 100 MG capsule

## 2021-04-16 NOTE — Assessment & Plan Note (Addendum)
Oral agent prescribed for 7 days, will need PA when I spoke with the pharmacist Self collected swab from mouth also fungal blood  Culture and baseline labs , also stool tests Reports systemic symptoms

## 2021-04-16 NOTE — Patient Instructions (Addendum)
Follow-up with Dr. Posey Pronto the first or second week in August call if you need to be seen sooner.  A 1 week course of oral medication, itraconazole,  for yeast infection is prescribed as we discussed.  Please come to the lab today to provide self collected specimens material from your tongue and throat for culture.  Labs to be done today will be complete blood count CMP and EGFR and blood and fungal culture diagnosis is systemic candidiasis and fever.  Stool samples to be sent for testing for culture due to new onset diarrhea.  Please ensure that you keep well-hydrated.

## 2021-04-16 NOTE — Telephone Encounter (Signed)
error 

## 2021-04-16 NOTE — Progress Notes (Signed)
Virtual Visit via Telephone Note  I connected with [place] on 04/16/21 at  8:00 AM EDT by telephone and verified that I am speaking with the correct person using two identifiers.  Location: Patient: home Provider: office   I discussed the limitations, risks, security and privacy concerns of performing an evaluation and management service by telephone and the availability of in person appointments. I also discussed with the patient that there may be a patient responsible charge related to this service. The patient expressed understanding and agreed to proceed.   History of Present Illness: 1 week h/o coated tongue was on oral nystatin by GI  which he a stopped on his own and states that he developed current problems once he stopped the nysatin  c/o headache fatigue, diarrheah started last night, rectal pain this morning , itchy eyes, excessive coating thick and white on tongue and back of throat Unclear as to any underlying illness which would cause this outbreak of excessive thrush C/o loose watery stool x 2 days Observations/Objective: There were no vitals taken for this visit. Good communication with no confusion and intact memory. Alert and oriented x 3 No signs of respiratory distress during speech   Assessment and Plan: Oral candidiasis Oral agent prescribed for 7 days, will need PA when I spoke with the pharmacist Self collected swab from mouth also fungal blood  Culture and baseline labs , also stool tests Reports systemic symptoms  Diarrhea Reports acute onset of watery stool wih systemic symptoms, stool to be sent for c/s   Follow Up Instructions:    I discussed the assessment and treatment plan with the patient. The patient was provided an opportunity to ask questions and all were answered. The patient agreed with the plan and demonstrated an understanding of the instructions.   The patient was advised to call back or seek an in-person evaluation if the  symptoms worsen or if the condition fails to improve as anticipated.  I provided  11 minutes of non-face-to-face time during this encounter.   Tula Nakayama, MD

## 2021-04-17 ENCOUNTER — Encounter: Payer: Self-pay | Admitting: Family Medicine

## 2021-04-17 LAB — CMP14+EGFR
ALT: 35 IU/L (ref 0–44)
AST: 27 IU/L (ref 0–40)
Albumin/Globulin Ratio: 2.3 — ABNORMAL HIGH (ref 1.2–2.2)
Albumin: 4.8 g/dL (ref 4.0–5.0)
Alkaline Phosphatase: 58 IU/L (ref 44–121)
BUN/Creatinine Ratio: 14 (ref 9–20)
BUN: 15 mg/dL (ref 6–20)
Bilirubin Total: 0.5 mg/dL (ref 0.0–1.2)
CO2: 23 mmol/L (ref 20–29)
Calcium: 10 mg/dL (ref 8.7–10.2)
Chloride: 98 mmol/L (ref 96–106)
Creatinine, Ser: 1.11 mg/dL (ref 0.76–1.27)
Globulin, Total: 2.1 g/dL (ref 1.5–4.5)
Glucose: 90 mg/dL (ref 65–99)
Potassium: 4.5 mmol/L (ref 3.5–5.2)
Sodium: 141 mmol/L (ref 134–144)
Total Protein: 6.9 g/dL (ref 6.0–8.5)
eGFR: 88 mL/min/{1.73_m2} (ref >59)

## 2021-04-17 NOTE — Telephone Encounter (Signed)
Old message. Patient has picked up rx

## 2021-04-17 NOTE — Telephone Encounter (Signed)
This has been submitted- waiting on pharmacy to respond

## 2021-04-17 NOTE — Assessment & Plan Note (Signed)
Reports acute onset of watery stool wih systemic symptoms, stool to be sent for c/s

## 2021-04-17 NOTE — Telephone Encounter (Signed)
Patient called left voicemail today at 9:27 says the insurance company is waiting on prior authorization to fill out a questionnaire for this rx to be filled. Insurance call back # [phone].

## 2021-04-18 MED ORDER — NYSTATIN 100000 UNIT/ML MT SUSP: 5.0000 mL | Freq: Four times a day (QID) | OROMUCOSAL | 2 refills | Status: DC

## 2021-04-20 LAB — UPPER RESPIRATORY CULTURE, ROUTINE

## 2021-04-22 ENCOUNTER — Ambulatory Visit (HOSPITAL_COMMUNITY): Payer: 59

## 2021-04-22 ENCOUNTER — Other Ambulatory Visit: Payer: Self-pay

## 2021-04-22 ENCOUNTER — Other Ambulatory Visit: Payer: Self-pay | Admitting: Family Medicine

## 2021-04-22 MED ORDER — FLUCONAZOLE 200 MG PO TABS: 200.0000 mg | ORAL_TABLET | Freq: Every day | ORAL | 0 refills | Status: DC

## 2021-04-22 MED ORDER — ITRACONAZOLE 100 MG PO CAPS: 100.0000 mg | ORAL_CAPSULE | Freq: Every day | ORAL | 0 refills | Status: DC

## 2021-04-22 NOTE — Telephone Encounter (Signed)
Per dr Moshe Cipro, rx'd fluconazole 200 mg 1 daily for 7 days to [facility]. Pt aware

## 2021-04-23 LAB — STOOL CULTURE: E coli, Shiga toxin Assay: NEGATIVE

## 2021-04-25 ENCOUNTER — Other Ambulatory Visit: Payer: Self-pay

## 2021-04-25 ENCOUNTER — Other Ambulatory Visit: Payer: Self-pay | Admitting: Family Medicine

## 2021-04-25 ENCOUNTER — Encounter: Payer: Self-pay | Admitting: Internal Medicine

## 2021-04-25 ENCOUNTER — Telehealth: Payer: Self-pay

## 2021-04-25 DIAGNOSIS — B370 Candidal stomatitis: Secondary | ICD-10-CM

## 2021-04-25 DIAGNOSIS — K1379 Other lesions of oral mucosa: Secondary | ICD-10-CM

## 2021-04-25 MED ORDER — ACYCLOVIR 800 MG PO TABS: 800.0000 mg | ORAL_TABLET | Freq: Every day | ORAL | 0 refills | Status: DC

## 2021-04-25 MED ORDER — LIDOCAINE VISCOUS HCL 2 % MT SOLN: 15.0000 mL | Freq: Four times a day (QID) | OROMUCOSAL | 0 refills | Status: DC | PRN

## 2021-04-25 NOTE — Telephone Encounter (Signed)
I called Hillary to let him know I did get the appt moved up to 05/14/21 3pm and Manuela Schwartz at [facility] put him on a callback list to work him in if they have a cancellation.  We are hoping he will be worked in one day next week.  He wants you to call him regarding a question about his meds.  Please call him at [phone]

## 2021-04-25 NOTE — Telephone Encounter (Signed)
Pt sent a mychart message that was sent to dr simpson as she is the one who provided the meds awaiting to hear back

## 2021-04-25 NOTE — Telephone Encounter (Signed)
Pt informed, scheduled. Labs ordered.Marland Kitchen

## 2021-04-27 ENCOUNTER — Encounter: Payer: Self-pay | Admitting: Internal Medicine

## 2021-04-28 LAB — HSV(HERPES SIMPLEX VRS) I + II AB-IGG
HSV 1 Glycoprotein G Ab, IgG: <0.91 index (ref 0.00–0.90)
HSV 2 IgG, Type Spec: 5.05 index — ABNORMAL HIGH (ref 0.00–0.90)

## 2021-04-28 NOTE — Progress Notes (Signed)
CARDIOLOGY CONSULT NOTE       Patient ID: Logan Benton MRN: [id number] DOB/AGE: Mar 11, 1981 40 y.o.  Admit date: (Not on file) Referring Physician: Posey Pronto Primary Physician: Lindell Spar, MD Primary Cardiologist: New Reason for Consultation: Chest Pain  Active Problems:   * No active hospital problems. *   HPI:  40 y.o. referred by Dr Posey Pronto for chest pain Complained of such on office visit 6/16.  4 weeks , left upper chest needle prick Pain worse on fluconazole Diagnosed with thrush and has had diarrhea Seeing GI with rectal pain He has had multiple EGD;s with esophagitis Barrett's sees Dr Abbey Chatters GI  He takes ACE for HTN  04/10/21 CT with gastritis And thickening of antrum   12 pack year history of smoking   Seen by Dr Raliegh Ip 11/17/19 with chest pain and palpitations   Echo August 2020 [facility] normal  ETT normal 11/25/19  Monitor 12/20/19 normal   Battling a herpetic esophageal infection now  Has pressure with anxiety in chest separate sharp pain that is distinct Able work out aerobics, weights and calisthenics 3 x/ week with no issue  ROS All other systems reviewed and negative except as noted above  Past Medical History:  Diagnosis Date   Abscessed tooth    Anxiety    C. difficile diarrhea    presumptive diagnosis   Depression    Hypertension    Trigger finger     Family History  Problem Relation Age of Onset   Rheum arthritis Mother    Bladder Cancer Mother    Kidney disease Father    Bladder Cancer Maternal Grandfather    Irritable bowel syndrome Paternal Grandmother    Colon cancer Neg Hx    Celiac disease Neg Hx    Inflammatory bowel disease Neg Hx     Social History   Socioeconomic History   Marital status: Divorced    Spouse name: Not on file   Number of children: Not on file   Years of education: Not on file   Highest education level: Not on file  Occupational History   Not on file  Tobacco Use   Smoking status: Former    Packs/day:  0.00    Years: 0.00    Pack years: 0.00    Types: Cigarettes    Quit date: 11/03/2019    Years since quitting: 1.4   Smokeless tobacco: Never   Tobacco comments:    nicoten gum  Vaping Use   Vaping Use: Former   Quit date: 03/07/2019  Substance and Sexual Activity   Alcohol use: No   Drug use: Not Currently   Sexual activity: Not Currently    Partners: Female  Other Topics Concern   Not on file  Social History Narrative   Not on file   Social Determinants of Health   Financial Resource Strain: Not on file  Food Insecurity: Not on file  Transportation Needs: Not on file  Physical Activity: Not on file  Stress: Not on file  Social Connections: Not on file  Intimate Partner Violence: Not on file    Past Surgical History:  Procedure Laterality Date   BILATERAL CARPAL TUNNEL RELEASE     BIOPSY  06/29/2020   Procedure: BIOPSY;  Surgeon: Eloise Harman, DO;  [facility];  Service: Endoscopy;;   BIOPSY  09/03/2020   Procedure: BIOPSY;  Surgeon: Eloise Harman, DO;  [facility];  Service: Endoscopy;;   BIOPSY  03/08/2021   Procedure: BIOPSY;  Surgeon: Eloise Harman, DO;  [facility];  Service: Endoscopy;;   COLONOSCOPY WITH PROPOFOL N/A 06/29/2020   Dr. Abbey Chatters: 4 mm tubular adenoma removed, nonbleeding internal hemorrhoids.  Repeat colonoscopy in 7 years.   ESOPHAGOGASTRODUODENOSCOPY (EGD) WITH PROPOFOL N/A 06/29/2020   Dr. Abbey Chatters: LA grade C esophagitis, gastritis with mild chronic gastritis on path, small bowel biopsy unremarkable.   ESOPHAGOGASTRODUODENOSCOPY (EGD) WITH PROPOFOL N/A 09/03/2020   Dr. Abbey Chatters: Esophageal mucosal changes suggestive of short segment Barrett's esophagus, biopsies consistent.  Next EGD in 3 years   ESOPHAGOGASTRODUODENOSCOPY (EGD) WITH PROPOFOL N/A 03/08/2021   Procedure: ESOPHAGOGASTRODUODENOSCOPY (EGD) WITH PROPOFOL;  Surgeon: Eloise Harman, DO;  [facility];  Service: Endoscopy;  Laterality:  N/A;  1:00pm   POLYPECTOMY  06/29/2020   Procedure: POLYPECTOMY;  Surgeon: Eloise Harman, DO;  [facility];  Service: Endoscopy;;   SHOULDER SURGERY Right    x2   TRIGGER FINGER RELEASE Right 2016      Current Outpatient Medications:    acyclovir (ZOVIRAX) 800 MG tablet, Take 1 tablet (800 mg total) by mouth 5 (five) times daily., Disp: 50 tablet, Rfl: 0   baclofen (LIORESAL) 10 MG tablet, Take 10 mg by mouth 2 (two) times daily. , Disp: , Rfl:    fluconazole (DIFLUCAN) 200 MG tablet, Take 1 tablet (200 mg total) by mouth daily., Disp: 7 tablet, Rfl: 0   itraconazole (SPORANOX) 100 MG capsule, Take 1 capsule (100 mg total) by mouth daily., Disp: 7 capsule, Rfl: 0   lidocaine (XYLOCAINE) 2 % solution, Use as directed 15 mLs in the mouth or throat every 6 (six) hours as needed for mouth pain., Disp: 100 mL, Rfl: 0   lisinopril (ZESTRIL) 5 MG tablet, Take 5 mg by mouth at bedtime., Disp: , Rfl:    Multiple Vitamins-Minerals (ADULT ONE DAILY GUMMIES PO), Take 2 tablets by mouth in the morning., Disp: , Rfl:    nystatin (MYCOSTATIN) 100000 UNIT/ML suspension, Take 5 mLs (500,000 Units total) by mouth 4 (four) times daily. Swish and swallow, Disp: 473 mL, Rfl: 2   omeprazole (PRILOSEC) 20 MG capsule, Take 20 mg by mouth in the morning., Disp: , Rfl:    Probiotic Product (PROBIOTIC-10 PO), Take 1 capsule by mouth in the morning and at bedtime., Disp: , Rfl:     Physical Exam: There were no vitals taken for this visit.    Affect appropriate Healthy:  appears stated age 3: normal Neck supple with no adenopathy JVP normal no bruits no thyromegaly Lungs clear with no wheezing and good diaphragmatic motion Heart:  S1/S2 no murmur, no rub, gallop or click PMI normal Abdomen: benighn, BS positve, no tenderness, no AAA no bruit.  No HSM or HJR Distal pulses intact with no bruits No edema Neuro non-focal Skin warm and dry No muscular weakness   Labs:   Lab Results   Component Value Date   WBC 5.4 02/11/2021   HGB 15.9 02/11/2021   HCT 47.9 02/11/2021   MCV 97.6 02/11/2021   PLT 243 02/11/2021   No results for input(s): NA, K, CL, CO2, BUN, CREATININE, CALCIUM, PROT, BILITOT, ALKPHOS, ALT, AST, GLUCOSE in the last 168 hours.  Invalid input(s): LABALBU No results found for: CKTOTAL, CKMB, CKMBINDEX, TROPONINI No results found for: CHOL No results found for: HDL No results found for: LDLCALC No results found for: TRIG No results found for: CHOLHDL No results found for: LDLDIRECT  Radiology: CT ABDOMEN WO CONTRAST  Result Date: 04/10/2021 CLINICAL DATA:  Upper abdomen pain with itching that radiates to back with bloating and yellow/white mucous in stool since 10/2020, history of c.diff, HTN, tubular adenoma, gastritis. EXAM: CT ABDOMEN WITHOUT CONTRAST TECHNIQUE: Multidetector CT imaging of the abdomen was performed following the standard protocol without IV contrast. COMPARISON:  CT 03/17/2020. FINDINGS: Lower chest: No acute abnormality. Hepatobiliary: No focal liver abnormality is seen. No gallstones, gallbladder wall thickening, or biliary dilatation. Pancreas: Unremarkable. No pancreatic ductal dilatation or surrounding inflammatory changes. Spleen: Normal in size without focal abnormality. Adrenals/Urinary Tract: Adrenal glands are unremarkable. Kidneys are normal, without renal calculi, contour deforming lesion, or hydronephrosis. Stomach/Bowel: Mild symmetric thickening of the gastric antrum. No evidence of bowel wall thickening, distention, or inflammatory changes. Vascular/Lymphatic: No significant vascular findings are present. No enlarged abdominal or pelvic lymph nodes. Other: No abdominal wall hernia or abnormality. Musculoskeletal: No acute or significant osseous findings. IMPRESSION: Mild symmetric thickening of the gastric antrum, which may represent gastritis. Electronically Signed   By: Dahlia Bailiff MD   On: 04/10/2021 10:33    EKG: SR  rate 78 ICRBBB 02/26/21    ASSESSMENT AND PLAN:   Chest pain: non cardiac sounding likely related to GI issues thrush/esophagitis shared decision making feel cardiac CTA best test to r/o CAD  HTN:  continue ACE f/u primary  GI;  herpes , oral thrush, esophagitis/gastritis with Barrett's f/u Dr Abbey Chatters has been on nystatin, sporanox, prilosec probiotic and zovirax Anxiety;  seems to be an issue based on multiple notes in Epic    BMET Lopressor 100 mg 2 hours before CT Cardiac CTA  F/U PRN if normal   Signed: Jenkins Rouge 04/28/2021, 10:03 PM

## 2021-04-29 ENCOUNTER — Encounter: Payer: Self-pay | Admitting: Internal Medicine

## 2021-04-29 ENCOUNTER — Other Ambulatory Visit: Payer: Self-pay

## 2021-04-29 MED ORDER — LISINOPRIL 5 MG PO TABS: 5.0000 mg | ORAL_TABLET | Freq: Every day | ORAL | 3 refills | Status: DC

## 2021-04-30 LAB — SPECIMEN STATUS REPORT

## 2021-05-01 ENCOUNTER — Encounter: Payer: Self-pay | Admitting: Cardiovascular Disease

## 2021-05-01 ENCOUNTER — Other Ambulatory Visit: Payer: Self-pay

## 2021-05-01 ENCOUNTER — Ambulatory Visit (INDEPENDENT_AMBULATORY_CARE_PROVIDER_SITE_OTHER): Payer: 59 | Admitting: Cardiovascular Disease

## 2021-05-01 VITALS — BP 130/86 | HR 76 | Ht 71.0 in | Wt 154.0 lb

## 2021-05-01 DIAGNOSIS — I10 Essential (primary) hypertension: Secondary | ICD-10-CM

## 2021-05-01 DIAGNOSIS — R079 Chest pain, unspecified: Secondary | ICD-10-CM | POA: Diagnosis not present

## 2021-05-01 DIAGNOSIS — F419 Anxiety disorder, unspecified: Secondary | ICD-10-CM | POA: Diagnosis not present

## 2021-05-01 DIAGNOSIS — K209 Esophagitis, unspecified without bleeding: Secondary | ICD-10-CM

## 2021-05-01 MED ORDER — METOPROLOL TARTRATE 100 MG PO TABS
100.0000 mg | ORAL_TABLET | ORAL | 0 refills | Status: DC
Start: 2021-05-01 — End: 2021-07-24

## 2021-05-01 NOTE — Patient Instructions (Signed)
Medication Instructions:   Take Lopressor 100 mg 2 hour prior to CT Scan   *If you need a refill on your cardiac medications before your next appointment, please call your pharmacy*   Lab Work: Your physician recommends that you return for lab work 1 week prior to CT Scan  If you have labs (blood work) drawn today and your tests are completely normal, you will receive your results only by: [place] (if you have MyChart) OR A paper copy in the mail If you have any lab test that is abnormal or we need to change your treatment, we will call you to review the results.   Testing/Procedures: Cardiac CTA    Follow-Up: At [facility], you and your health needs are our priority.  As part of our continuing mission to provide you with exceptional heart care, we have created designated Provider Care Teams.  These Care Teams include your primary Cardiologist (physician) and Advanced Practice Providers (APPs -  Physician Assistants and Nurse Practitioners) who all work together to provide you with the care you need, when you need it.  We recommend signing up for the patient portal called "MyChart".  Sign up information is provided on this After Visit Summary.  MyChart is used to connect with patients for Virtual Visits (Telemedicine).  Patients are able to view lab/test results, encounter notes, upcoming appointments, etc.  Non-urgent messages can be sent to your provider as well.   To learn more about what you can do with MyChart, go to [email or web address].    Your next appointment:    As Needed   The format for your next appointment:   In Person  Provider:   Jenkins Rouge, MD   Other Instructions Thank you for choosing [facility]!    Your cardiac CT will be scheduled at one of the below locations:   [facility] [address] [place], [place] [address] [phone]  [place] [address] [place], [place] [address] [phone]  If scheduled at [facility], please arrive at the [facility] main entrance (entrance A) of [facility] 30 minutes prior to test start time. Proceed to the [facility] Radiology Department (first floor) to check-in and test prep.  If scheduled at [facility], please arrive 15 mins early for check-in and test prep.  Please follow these instructions carefully (unless otherwise directed):  Hold all erectile dysfunction medications at least 3 days (72 hrs) prior to test.  On the Night Before the Test: Be sure to Drink plenty of water. Do not consume any caffeinated/decaffeinated beverages or chocolate 12 hours prior to your test. Do not take any antihistamines 12 hours prior to your test. If the patient has contrast allergy: Patient will need a prescription for Prednisone and very clear instructions (as follows): Prednisone 50 mg - take 13 hours prior to test Take another Prednisone 50 mg 7 hours prior to test Take another Prednisone 50 mg 1 hour prior to test Take Benadryl 50 mg 1 hour prior to test Patient must complete all four doses of above prophylactic medications. Patient will need a ride after test due to Benadryl.  On the Day of the Test: Drink plenty of water until 1 hour prior to the test. Do not eat any food 4 hours prior to the test. You may take your regular medications prior to the test.  Take metoprolol (Lopressor) two hours prior to test.  HOLD Furosemide/Hydrochlorothiazide morning of the test.   *For Clinical Staff only. Please instruct patient the following:* Heart Rate Medication Recommendations for Cardiac CT  Resting HR < 50 bpm  No medication  Resting HR 50-60 bpm and BP >110/50 mmHG   Consider Metoprolol tartrate 25 mg PO 90-120 min prior to scan  Resting HR 60-65 bpm and BP >110/50 mmHG  Metoprolol tartrate 50 mg PO 90-120 minutes prior to scan   Resting HR > 65  bpm and BP >110/50 mmHG  Metoprolol tartrate 100 mg PO 90-120 minutes prior to scan  Consider Ivabradine 10-15 mg PO or a calcium channel blocker for resting HR >60 bpm and contraindication to metoprolol tartrate  Consider Ivabradine 10-15 mg PO in combination with metoprolol tartrate for HR >80 bpm         After the Test: Drink plenty of water. After receiving IV contrast, you may experience a mild flushed feeling. This is normal. On occasion, you may experience a mild rash up to 24 hours after the test. This is not dangerous. If this occurs, you can take Benadryl 25 mg and increase your fluid intake. If you experience trouble breathing, this can be serious. If it is severe call 911 IMMEDIATELY. If it is mild, please call our office. If you take any of these medications: Glipizide/Metformin, Avandament, Glucavance, please do not take 48 hours after completing test unless otherwise instructed.  Please allow 2-4 weeks for scheduling of routine cardiac CTs. Some insurance companies require a pre-authorization which may delay scheduling of this test.   For non-scheduling related questions, please contact the cardiac imaging nurse navigator should you have any questions/concerns: Marchia Bond, Cardiac Imaging Nurse Navigator Gordy Clement, Cardiac Imaging Nurse Navigator [facility] Direct Office Dial: [phone]   For scheduling needs, including cancellations and rescheduling, please call [place], [phone].

## 2021-05-02 ENCOUNTER — Encounter: Payer: Self-pay | Admitting: Internal Medicine

## 2021-05-02 ENCOUNTER — Ambulatory Visit (INDEPENDENT_AMBULATORY_CARE_PROVIDER_SITE_OTHER): Payer: 59 | Admitting: Internal Medicine

## 2021-05-02 ENCOUNTER — Telehealth: Payer: Self-pay | Admitting: Gastroenterology

## 2021-05-02 VITALS — BP 145/79 | HR 68 | Temp 98.3°F | Resp 18 | Ht 71.0 in | Wt 156.1 lb

## 2021-05-02 DIAGNOSIS — Z114 Encounter for screening for human immunodeficiency virus [HIV]: Secondary | ICD-10-CM

## 2021-05-02 DIAGNOSIS — J029 Acute pharyngitis, unspecified: Secondary | ICD-10-CM | POA: Diagnosis not present

## 2021-05-02 DIAGNOSIS — Z1159 Encounter for screening for other viral diseases: Secondary | ICD-10-CM | POA: Diagnosis not present

## 2021-05-02 DIAGNOSIS — B370 Candidal stomatitis: Secondary | ICD-10-CM | POA: Diagnosis not present

## 2021-05-02 MED ORDER — PEG 3350-KCL-NA BICARB-NACL 420 G PO SOLR: ORAL | 0 refills | Status: DC

## 2021-05-02 NOTE — Addendum Note (Signed)
Addended by: Cheron Every on: 05/02/2021 01:42 PM   Modules accepted: Orders

## 2021-05-02 NOTE — Telephone Encounter (Signed)
See pt advice request. 

## 2021-05-02 NOTE — Progress Notes (Signed)
Acute Office Visit  Subjective:    Patient ID: Logan Benton, male    DOB: 12/23/1980, 40 y.o.   MRN: [id number]  Chief Complaint  Patient presents with   Follow-up    Follow up pt still having throat issues and chest discomfort and fatigue he started acyclovir is some better not all the way     HPI Patient is in today for evaluation of sore throat and noticing whitish plaques in the mouth, which have been recurrent. He was recently given Acyclovir for positive herpes antibody by a different provider. He states that his sore throat is little better, but still notices whitish patches at times. Denies any dysphagia or odynophagia.  Past Medical History:  Diagnosis Date   Abscessed tooth    Anxiety    C. difficile diarrhea    presumptive diagnosis   Depression    Hypertension    Trigger finger     Past Surgical History:  Procedure Laterality Date   BILATERAL CARPAL TUNNEL RELEASE     BIOPSY  06/29/2020   Procedure: BIOPSY;  Surgeon: Eloise Harman, DO;  [facility];  Service: Endoscopy;;   BIOPSY  09/03/2020   Procedure: BIOPSY;  Surgeon: Eloise Harman, DO;  [facility];  Service: Endoscopy;;   BIOPSY  03/08/2021   Procedure: BIOPSY;  Surgeon: Eloise Harman, DO;  [facility];  Service: Endoscopy;;   COLONOSCOPY WITH PROPOFOL N/A 06/29/2020   Dr. Abbey Chatters: 4 mm tubular adenoma removed, nonbleeding internal hemorrhoids.  Repeat colonoscopy in 7 years.   ESOPHAGOGASTRODUODENOSCOPY (EGD) WITH PROPOFOL N/A 06/29/2020   Dr. Abbey Chatters: LA grade C esophagitis, gastritis with mild chronic gastritis on path, small bowel biopsy unremarkable.   ESOPHAGOGASTRODUODENOSCOPY (EGD) WITH PROPOFOL N/A 09/03/2020   Dr. Abbey Chatters: Esophageal mucosal changes suggestive of short segment Barrett's esophagus, biopsies consistent.  Next EGD in 3 years   ESOPHAGOGASTRODUODENOSCOPY (EGD) WITH PROPOFOL N/A 03/08/2021   Procedure: ESOPHAGOGASTRODUODENOSCOPY (EGD) WITH  PROPOFOL;  Surgeon: Eloise Harman, DO;  [facility];  Service: Endoscopy;  Laterality: N/A;  1:00pm   POLYPECTOMY  06/29/2020   Procedure: POLYPECTOMY;  Surgeon: Eloise Harman, DO;  [facility];  Service: Endoscopy;;   SHOULDER SURGERY Right    x2   TRIGGER FINGER RELEASE Right 2016    Family History  Problem Relation Age of Onset   Rheum arthritis Mother    Bladder Cancer Mother    Kidney disease Father    Bladder Cancer Maternal Grandfather    Irritable bowel syndrome Paternal Grandmother    Colon cancer Neg Hx    Celiac disease Neg Hx    Inflammatory bowel disease Neg Hx     Social History   Socioeconomic History   Marital status: Divorced    Spouse name: Not on file   Number of children: Not on file   Years of education: Not on file   Highest education level: Not on file  Occupational History   Not on file  Tobacco Use   Smoking status: Former    Packs/day: 0.00    Years: 0.00    Pack years: 0.00    Types: Cigarettes    Quit date: 11/03/2019    Years since quitting: 1.4   Smokeless tobacco: Never   Tobacco comments:    nicoten gum  Vaping Use   Vaping Use: Former   Quit date: 03/07/2019  Substance and Sexual Activity   Alcohol use: No  Drug use: Not Currently   Sexual activity: Not Currently    Partners: Female  Other Topics Concern   Not on file  Social History Narrative   Not on file   Social Determinants of Health   Financial Resource Strain: Not on file  Food Insecurity: Not on file  Transportation Needs: Not on file  Physical Activity: Not on file  Stress: Not on file  Social Connections: Not on file  Intimate Partner Violence: Not on file    Outpatient Medications Prior to Visit  Medication Sig Dispense Refill   acyclovir (ZOVIRAX) 800 MG tablet Take 1 tablet (800 mg total) by mouth 5 (five) times daily. 50 tablet 0   baclofen (LIORESAL) 10 MG tablet Take 10 mg by mouth 2 (two) times daily.      lidocaine  (XYLOCAINE) 2 % solution Use as directed 15 mLs in the mouth or throat every 6 (six) hours as needed for mouth pain. 100 mL 0   lisinopril (ZESTRIL) 5 MG tablet Take 1 tablet (5 mg total) by mouth at bedtime. 30 tablet 3   metoprolol tartrate (LOPRESSOR) 100 MG tablet Take 1 tablet (100 mg total) by mouth as directed. 1 tablet 0   Multiple Vitamins-Minerals (ADULT ONE DAILY GUMMIES PO) Take 2 tablets by mouth in the morning.     nystatin (MYCOSTATIN) 100000 UNIT/ML suspension Take 5 mLs (500,000 Units total) by mouth 4 (four) times daily. Swish and swallow 473 mL 2   omeprazole (PRILOSEC) 20 MG capsule Take 20 mg by mouth in the morning.     Probiotic Product (PROBIOTIC-10 PO) Take 1 capsule by mouth in the morning and at bedtime.     fluconazole (DIFLUCAN) 200 MG tablet Take 1 tablet (200 mg total) by mouth daily. 7 tablet 0   itraconazole (SPORANOX) 100 MG capsule Take 1 capsule (100 mg total) by mouth daily. 7 capsule 0   No facility-administered medications prior to visit.    Allergies  Allergen Reactions   Fluconazole     Chest pain     Review of Systems  Constitutional:  Negative for chills and fever.  HENT:  Positive for mouth sores. Negative for congestion and sore throat.   Eyes:  Negative for pain and discharge.  Respiratory:  Negative for cough and shortness of breath.   Cardiovascular:  Negative for chest pain and palpitations.  Gastrointestinal:  Positive for abdominal pain and diarrhea. Negative for constipation, nausea and vomiting.  Endocrine: Negative for polydipsia and polyuria.  Genitourinary:  Negative for dysuria and hematuria.  Musculoskeletal:  Negative for neck pain and neck stiffness.  Skin:  Negative for rash.  Neurological:  Negative for dizziness, weakness, numbness and headaches.  Psychiatric/Behavioral:  Negative for agitation and behavioral problems.       Objective:    Physical Exam Vitals reviewed.  Constitutional:      General: He is not in  acute distress.    Appearance: He is not diaphoretic.  HENT:     Head: Normocephalic and atraumatic.     Nose: Nose normal.     Mouth/Throat:     Mouth: Mucous membranes are moist.     Pharynx: No posterior oropharyngeal erythema.  Eyes:     General: No scleral icterus.    Extraocular Movements: Extraocular movements intact.  Cardiovascular:     Rate and Rhythm: Normal rate and regular rhythm.     Pulses: Normal pulses.     Heart sounds: Normal heart sounds. No murmur heard.  Pulmonary:     Breath sounds: Normal breath sounds. No wheezing or rales.  Abdominal:     Palpations: Abdomen is soft.     Tenderness: There is no abdominal tenderness.  Musculoskeletal:     Cervical back: Neck supple. No tenderness.     Right lower leg: No edema.     Left lower leg: No edema.  Skin:    General: Skin is warm.     Findings: No rash.  Neurological:     General: No focal deficit present.     Mental Status: He is alert and oriented to person, place, and time.  Psychiatric:        Mood and Affect: Mood normal.        Behavior: Behavior normal.    BP (!) 145/79 (BP Location: Left Arm, Patient Position: Sitting, Cuff Size: Normal)   Pulse 68   Temp 98.3 F (36.8 C) (Oral)   Resp 18   Ht '5\' 11"'  (1.803 m)   Wt 156 lb 1.9 oz (70.8 kg)   SpO2 98%   BMI 21.77 kg/m  Wt Readings from Last 3 Encounters:  05/02/21 156 lb 1.9 oz (70.8 kg)  05/01/21 154 lb (69.9 kg)  03/21/21 153 lb 6.4 oz (69.6 kg)    Health Maintenance Due  Topic Date Due   Pneumococcal Vaccine 4-46 Years old (1 - PCV) Never done   Hepatitis C Screening  Never done   COVID-19 Vaccine (3 - Moderna risk series) 05/08/2020    There are no preventive care reminders to display for this patient.   Lab Results  Component Value Date   TSH 0.720 10/21/2019   Lab Results  Component Value Date   WBC 5.4 02/11/2021   HGB 15.9 02/11/2021   HCT 47.9 02/11/2021   MCV 97.6 02/11/2021   PLT 243 02/11/2021   Lab Results   Component Value Date   NA 141 04/16/2021   K 4.5 04/16/2021   CO2 23 04/16/2021   GLUCOSE 90 04/16/2021   BUN 15 04/16/2021   CREATININE 1.11 04/16/2021   BILITOT 0.5 04/16/2021   ALKPHOS 58 04/16/2021   AST 27 04/16/2021   ALT 35 04/16/2021   PROT 6.9 04/16/2021   ALBUMIN 4.8 04/16/2021   CALCIUM 10.0 04/16/2021   ANIONGAP 7 02/11/2021   EGFR 88 04/16/2021   No results found for: CHOL No results found for: HDL No results found for: LDLCALC No results found for: TRIG No results found for: CHOLHDL No results found for: HGBA1C     Assessment & Plan:   Problem List Items Addressed This Visit       Digestive   Oral candidiasis On Nystatin suspension   Other Visit Diagnoses     Sore throat    -  Primary Unclear etiology Less likely to be related to herpes antibody related Continue Lidocaine mouthwash for symptomatic relief If persistent, will obtain ENT evaluation   Screening for HIV (human immunodeficiency virus)       Relevant Orders   HIV antibody (with reflex)   Need for hepatitis C screening test       Relevant Orders   Hepatitis C Antibody   Need for hepatitis B screening test       Relevant Orders   Hepatitis B Surface AntiGEN        No orders of the defined types were placed in this encounter.    Lindell Spar, MD

## 2021-05-02 NOTE — Patient Instructions (Signed)
Please use Lidocaine suspension for sore throat.  Continue to take other medications as prescribed.  Please avoid hot and spicy food.  Please get blood tests done within a week.

## 2021-05-02 NOTE — Telephone Encounter (Signed)
[facility] clinical pool: please arrange colonoscopy with Dr. Abbey Chatters. ASA 2. Due to change in bowel habits, rectal bleeding, mucus in stool. Patient recently had a colonoscopy. He is desiring this again. I spoke with Dr. Abbey Chatters about this.

## 2021-05-09 ENCOUNTER — Encounter: Payer: Self-pay | Admitting: Internal Medicine

## 2021-05-09 ENCOUNTER — Other Ambulatory Visit: Payer: Self-pay | Admitting: Internal Medicine

## 2021-05-09 DIAGNOSIS — M5430 Sciatica, unspecified side: Secondary | ICD-10-CM

## 2021-05-09 MED ORDER — BACLOFEN 10 MG PO TABS: 10.0000 mg | ORAL_TABLET | Freq: Two times a day (BID) | ORAL | 2 refills | Status: DC

## 2021-05-10 ENCOUNTER — Other Ambulatory Visit: Payer: Self-pay

## 2021-05-10 ENCOUNTER — Encounter (HOSPITAL_COMMUNITY)
Admission: RE | Disposition: A | Discharge status: Home / Self Care | Payer: Self-pay | Source: Home / Self Care | Attending: Internal Medicine

## 2021-05-10 ENCOUNTER — Ambulatory Visit (HOSPITAL_COMMUNITY): Payer: 59 | Admitting: Anesthesiology

## 2021-05-10 ENCOUNTER — Encounter (HOSPITAL_COMMUNITY): Payer: Self-pay

## 2021-05-10 ENCOUNTER — Other Ambulatory Visit: Payer: Self-pay | Admitting: Internal Medicine

## 2021-05-10 ENCOUNTER — Ambulatory Visit (HOSPITAL_COMMUNITY)
Admission: RE | Admit: 2021-05-10 | Discharge: 2021-05-10 | Disposition: A | Discharge status: Home / Self Care | Payer: 59 | Attending: Internal Medicine | Admitting: Internal Medicine

## 2021-05-10 DIAGNOSIS — Z87891 Personal history of nicotine dependence: Secondary | ICD-10-CM | POA: Insufficient documentation

## 2021-05-10 DIAGNOSIS — Z888 Allergy status to other drugs, medicaments and biological substances status: Secondary | ICD-10-CM | POA: Diagnosis not present

## 2021-05-10 DIAGNOSIS — Z79899 Other long term (current) drug therapy: Secondary | ICD-10-CM | POA: Insufficient documentation

## 2021-05-10 DIAGNOSIS — K625 Hemorrhage of anus and rectum: Secondary | ICD-10-CM | POA: Insufficient documentation

## 2021-05-10 DIAGNOSIS — Z8052 Family history of malignant neoplasm of bladder: Secondary | ICD-10-CM | POA: Diagnosis not present

## 2021-05-10 DIAGNOSIS — K648 Other hemorrhoids: Secondary | ICD-10-CM | POA: Insufficient documentation

## 2021-05-10 DIAGNOSIS — K529 Noninfective gastroenteritis and colitis, unspecified: Secondary | ICD-10-CM | POA: Insufficient documentation

## 2021-05-10 DIAGNOSIS — Z841 Family history of disorders of kidney and ureter: Secondary | ICD-10-CM | POA: Insufficient documentation

## 2021-05-10 DIAGNOSIS — Z8379 Family history of other diseases of the digestive system: Secondary | ICD-10-CM | POA: Insufficient documentation

## 2021-05-10 DIAGNOSIS — Z8261 Family history of arthritis: Secondary | ICD-10-CM | POA: Diagnosis not present

## 2021-05-10 HISTORY — PX: COLONOSCOPY WITH PROPOFOL: SHX5780

## 2021-05-10 HISTORY — PX: BIOPSY: SHX5522

## 2021-05-10 LAB — HIV ANTIBODY (ROUTINE TESTING W REFLEX): HIV Screen 4th Generation wRfx: NONREACTIVE

## 2021-05-10 LAB — HEPATITIS B SURFACE ANTIGEN: Hepatitis B Surface Ag: NEGATIVE

## 2021-05-10 LAB — HEPATITIS C ANTIBODY: Hep C Virus Ab: <0.1 s/co ratio (ref 0.0–0.9)

## 2021-05-10 SURGERY — COLONOSCOPY WITH PROPOFOL
Anesthesia: General

## 2021-05-10 MED ORDER — PROPOFOL 10 MG/ML IV BOLUS
INTRAVENOUS | Status: DC | PRN
  Administered 2021-05-10: 100 mg via INTRAVENOUS
  Administered 2021-05-10: 150 ug/kg/min via INTRAVENOUS

## 2021-05-10 MED ORDER — LACTATED RINGERS IV SOLN: INTRAVENOUS | Status: DC

## 2021-05-10 NOTE — Op Note (Signed)
[facility] Patient Name: Logan Benton Procedure Date: 05/10/2021 12:49 PM MRN: [id number] Date of Birth: 11/20/1980 Attending MD: Elon Alas. Abbey Chatters DO CSN: [id number] Age: 40 Admit Type: Outpatient Procedure:                Colonoscopy Indications:              Chronic diarrhea, Rectal bleeding Providers:                Elon Alas. Solly Derasmo, DO, [place]is [place] B. Sharon Seller, RN,                            Raphael Gibney, Technician Referring MD:              Medicines:                See the Anesthesia note for documentation of the                            administered medications Complications:            No immediate complications. Estimated Blood Loss:     Estimated blood loss was minimal. Procedure:                Pre-Anesthesia Assessment:                           - The anesthesia plan was to use monitored                            anesthesia care (MAC).                           After obtaining informed consent, the colonoscope                            was passed under direct vision. Throughout the                            procedure, the patient's blood pressure, pulse, and                            oxygen saturations were monitored continuously. The                            [id number]-HQ190L ([id number]) scope was introduced through                            the anus and advanced to the the terminal ileum,                            with identification of the appendiceal orifice and                            IC valve. The colonoscopy was performed without                            difficulty. The patient tolerated  the procedure                            well. The quality of the bowel preparation was                            evaluated using the BBPS [facility] Bowel Preparation                            Scale) with scores of: Right Colon = 2 (minor                            amount of residual staining, small fragments of                            stool and/or opaque liquid,  but mucosa seen well),                            Transverse Colon = 3 (entire mucosa seen well with                            no residual staining, small fragments of stool or                            opaque liquid) and Left Colon = 3 (entire mucosa                            seen well with no residual staining, small                            fragments of stool or opaque liquid). The total                            BBPS score equals 8. The quality of the bowel                            preparation was good. Scope In: 1:00:21 PM Scope Out: 1:14:27 PM Scope Withdrawal Time: 0 hours 11 minutes 29 seconds  Total Procedure Duration: 0 hours 14 minutes 6 seconds  Findings:      The perianal and digital rectal examinations were normal.      Non-bleeding internal hemorrhoids were found during endoscopy.      The terminal ileum appeared normal. Biopsies were taken with a cold       forceps for histology.      Biopsies for histology were taken with a cold forceps from the ascending       colon, transverse colon and descending colon for evaluation of       microscopic colitis. Impression:               - Non-bleeding internal hemorrhoids.                           - The examined portion of the ileum was normal.  Biopsied.                           - Biopsies were taken with a cold forceps from the                            ascending colon, transverse colon and descending                            colon for evaluation of microscopic colitis. Moderate Sedation:      Per Anesthesia Care Recommendation:           - Patient has a contact number available for                            emergencies. The signs and symptoms of potential                            delayed complications were discussed with the                            patient. Return to normal activities tomorrow.                            Written discharge instructions were provided to the                             patient.                           - Resume previous diet.                           - Continue present medications.                           - Await pathology results.                           - Repeat colonoscopy in 5 years for surveillance                            given tubular adenoma on previous CLN.                           - Return to GI clinic in 3 months. Procedure Code(s):        --- Professional ---                           [phone], Colonoscopy, flexible; with biopsy, single                            or multiple Diagnosis Code(s):        --- Professional ---                           K64.8, Other hemorrhoids  K52.9, Noninfective gastroenteritis and colitis,                            unspecified                           K62.5, Hemorrhage of anus and rectum CPT copyright 2019 American Medical Association. All rights reserved. The codes documented in this report are preliminary and upon coder review may  be revised to meet current compliance requirements. Elon Alas. Abbey Chatters, DO [place] Abbey Chatters, DO 05/10/2021 1:19:47 PM This report has been signed electronically. Number of Addenda: 0

## 2021-05-10 NOTE — Anesthesia Postprocedure Evaluation (Signed)
Anesthesia Post Note  Patient: Logan Benton  Procedure(s) Performed: COLONOSCOPY WITH PROPOFOL BIOPSY  Patient location during evaluation: Endoscopy Anesthesia Type: General Level of consciousness: awake and alert and oriented Pain management: pain level controlled Vital Signs Assessment: post-procedure vital signs reviewed and stable Respiratory status: spontaneous breathing and respiratory function stable Cardiovascular status: blood pressure returned to baseline and stable Postop Assessment: no apparent nausea or vomiting Anesthetic complications: no   No notable events documented.   Last Vitals:  Vitals:   05/10/21 1210 05/10/21 1318  BP:  117/67  Pulse: (!) 57 (!) 56  Resp: 11 11  Temp: 36.8 C 36.4 C  SpO2: 98% 99%    Last Pain:  Vitals:   05/10/21 1318  TempSrc: Oral  PainSc: 4                  Shirl Weir C Letecia Arps

## 2021-05-10 NOTE — Transfer of Care (Signed)
Immediate Anesthesia Transfer of Care Note  Patient: Logan Benton  Procedure(s) Performed: COLONOSCOPY WITH PROPOFOL BIOPSY  Patient Location: Endoscopy Unit  Anesthesia Type:General  Level of Consciousness: awake, alert , oriented and drowsy  Airway & Oxygen Therapy: Patient Spontanous Breathing  Post-op Assessment: Report given to RN, Post -op Vital signs reviewed and stable and Patient moving all extremities X 4  Post vital signs: Reviewed and stable  Last Vitals:  Vitals Value Taken Time  BP    Temp    Pulse    Resp    SpO2      Last Pain:  Vitals:   05/10/21 1257  TempSrc:   PainSc: 4       Patients Stated Pain Goal: 2 ([id number])  Complications: No notable events documented.

## 2021-05-10 NOTE — Anesthesia Preprocedure Evaluation (Addendum)
Anesthesia Evaluation  Patient identified by MRN, date of birth, ID band Patient awake    Reviewed: Allergy & Precautions, NPO status , Patient's Chart, lab work & pertinent test results, reviewed documented beta blocker date and time   History of Anesthesia Complications Negative for: history of anesthetic complications  Airway        Dental  (+) Dental Advisory Given   Pulmonary former smoker,    Pulmonary exam normal        Cardiovascular Exercise Tolerance: Good hypertension, Pt. on medications and Pt. on home beta blockers Normal cardiovascular exam Rhythm:Regular Rate:Normal     Neuro/Psych PSYCHIATRIC DISORDERS Anxiety Depression  Neuromuscular disease    GI/Hepatic negative GI ROS, Neg liver ROS,   Endo/Other  Hyperthyroidism (low normal TSH)   Renal/GU negative Renal ROS     Musculoskeletal negative musculoskeletal ROS (+)   Abdominal   Peds  Hematology negative hematology ROS (+)   Anesthesia Other Findings   Reproductive/Obstetrics negative OB ROS                            Anesthesia Physical Anesthesia Plan  ASA: 2  Anesthesia Plan: General   Post-op Pain Management:    Induction: Intravenous  PONV Risk Score and Plan: Propofol infusion  Airway Management Planned: Nasal Cannula and Natural Airway  Additional Equipment:   Intra-op Plan:   Post-operative Plan:   Informed Consent: I have reviewed the patients History and Physical, chart, labs and discussed the procedure including the risks, benefits and alternatives for the proposed anesthesia with the patient or authorized representative who has indicated his/her understanding and acceptance.     Dental advisory given  Plan Discussed with: CRNA and Surgeon  Anesthesia Plan Comments:         Anesthesia Quick Evaluation

## 2021-05-10 NOTE — Discharge Instructions (Addendum)
  Colonoscopy Discharge Instructions  Read the instructions outlined below and refer to this sheet in the next few weeks. These discharge instructions provide you with general information on caring for yourself after you leave the hospital. Your doctor may also give you specific instructions. While your treatment has been planned according to the most current medical practices available, unavoidable complications occasionally occur.   ACTIVITY You may resume your regular activity, but move at a slower pace for the next 24 hours.  Take frequent rest periods for the next 24 hours.  Walking will help get rid of the air and reduce the bloated feeling in your belly (abdomen).  No driving for 24 hours (because of the medicine (anesthesia) used during the test).   Do not sign any important legal documents or operate any machinery for 24 hours (because of the anesthesia used during the test).  NUTRITION Drink plenty of fluids.  You may resume your normal diet as instructed by your doctor.  Begin with a light meal and progress to your normal diet. Heavy or fried foods are harder to digest and may make you feel sick to your stomach (nauseated).  Avoid alcoholic beverages for 24 hours or as instructed.  MEDICATIONS You may resume your normal medications unless your doctor tells you otherwise.  WHAT YOU CAN EXPECT TODAY Some feelings of bloating in the abdomen.  Passage of more gas than usual.  Spotting of blood in your stool or on the toilet paper.  IF YOU HAD POLYPS REMOVED DURING THE COLONOSCOPY: No aspirin products for 7 days or as instructed.  No alcohol for 7 days or as instructed.  Eat a soft diet for the next 24 hours.  FINDING OUT THE RESULTS OF YOUR TEST Not all test results are available during your visit. If your test results are not back during the visit, make an appointment with your caregiver to find out the results. Do not assume everything is normal if you have not heard from your  caregiver or the medical facility. It is important for you to follow up on all of your test results.  SEEK IMMEDIATE MEDICAL ATTENTION IF: You have more than a spotting of blood in your stool.  Your belly is swollen (abdominal distention).  You are nauseated or vomiting.  You have a temperature over 101.  You have abdominal pain or discomfort that is severe or gets worse throughout the day.   Your colonoscopy was relatively unremarkable. I did not see any inflammation indicative underlying inflammatory bowel disease such as Crohn's disease or ulcerative colitis.  I did not find a polyps or evidence of colon cancer.  Would recommend we repeat in 5 years given polyps seen on previous colonoscopy.  I did take numerous biopsies throughout your colon to rule out a condition called microscopic colitis which can cause abdominal pain, bloating and diarrhea.  Follow-up with GI in 3 months.  I hope you have a great rest of your week!  Elon Alas. Abbey Chatters, D.O. Gastroenterology and Hepatology [facility]

## 2021-05-10 NOTE — H&P (Signed)
Primary Care Physician:  Lindell Spar, MD Primary Gastroenterologist:  Dr. Abbey Chatters  Pre-Procedure History & Physical: HPI:  Logan Benton is a 40 y.o. male is here for a colonoscopy to be performed Due to change in bowel habits, rectal bleeding, mucus in stool.  Past Medical History:  Diagnosis Date   Abscessed tooth    Anxiety    C. difficile diarrhea    presumptive diagnosis   Depression    Hypertension    Trigger finger     Past Surgical History:  Procedure Laterality Date   BILATERAL CARPAL TUNNEL RELEASE     BIOPSY  06/29/2020   Procedure: BIOPSY;  Surgeon: Eloise Harman, DO;  [facility];  Service: Endoscopy;;   BIOPSY  09/03/2020   Procedure: BIOPSY;  Surgeon: Eloise Harman, DO;  [facility];  Service: Endoscopy;;   BIOPSY  03/08/2021   Procedure: BIOPSY;  Surgeon: Eloise Harman, DO;  [facility];  Service: Endoscopy;;   COLONOSCOPY WITH PROPOFOL N/A 06/29/2020   Dr. Abbey Chatters: 4 mm tubular adenoma removed, nonbleeding internal hemorrhoids.  Repeat colonoscopy in 7 years.   ESOPHAGOGASTRODUODENOSCOPY (EGD) WITH PROPOFOL N/A 06/29/2020   Dr. Abbey Chatters: LA grade C esophagitis, gastritis with mild chronic gastritis on path, small bowel biopsy unremarkable.   ESOPHAGOGASTRODUODENOSCOPY (EGD) WITH PROPOFOL N/A 09/03/2020   Dr. Abbey Chatters: Esophageal mucosal changes suggestive of short segment Barrett's esophagus, biopsies consistent.  Next EGD in 3 years   ESOPHAGOGASTRODUODENOSCOPY (EGD) WITH PROPOFOL N/A 03/08/2021   Procedure: ESOPHAGOGASTRODUODENOSCOPY (EGD) WITH PROPOFOL;  Surgeon: Eloise Harman, DO;  [facility];  Service: Endoscopy;  Laterality: N/A;  1:00pm   POLYPECTOMY  06/29/2020   Procedure: POLYPECTOMY;  Surgeon: Eloise Harman, DO;  [facility];  Service: Endoscopy;;   SHOULDER SURGERY Right    x2   TRIGGER FINGER RELEASE Right 2016    Prior to Admission medications   Medication Sig Start Date  End Date Taking? Authorizing Provider  acyclovir (ZOVIRAX) 800 MG tablet Take 1 tablet (800 mg total) by mouth 5 (five) times daily. Patient taking differently: Take 800 mg by mouth See admin instructions. Take '800mg'$  five times a day during outbreaks. 04/25/21  Yes Fayrene Helper, MD  Ascorbic Acid (VITAMIN C PO) Take 1 tablet by mouth in the morning, at noon, and at bedtime.   Yes [provider]  baclofen (LIORESAL) 10 MG tablet Take 1 tablet (10 mg total) by mouth 2 (two) times daily. 05/09/21  Yes Lindell Spar, MD  lidocaine (XYLOCAINE) 2 % solution Use as directed 15 mLs in the mouth or throat every 6 (six) hours as needed for mouth pain. 04/25/21  Yes Fayrene Helper, MD  lisinopril (ZESTRIL) 5 MG tablet Take 1 tablet (5 mg total) by mouth at bedtime. 04/29/21  Yes Fayrene Helper, MD  Multiple Vitamin (MULTIVITAMIN WITH MINERALS) TABS tablet Take 1 tablet by mouth daily.   Yes [provider]  nystatin (MYCOSTATIN) 100000 UNIT/ML suspension Take 5 mLs (500,000 Units total) by mouth 4 (four) times daily. Swish and swallow 04/18/21  Yes Annitta Needs, NP  omeprazole (PRILOSEC) 20 MG capsule Take 20 mg by mouth in the morning.   Yes [provider]  polyethylene glycol-electrolytes (NULYTELY) 420 g solution As directed 05/02/21  Yes Annitta Needs, NP  Probiotic Product (PROBIOTIC-10 PO) Take 1 capsule by mouth daily.   Yes [provider]  metoprolol tartrate (LOPRESSOR)  100 MG tablet Take 1 tablet (100 mg total) by mouth as directed. Patient not taking: No sig reported 05/01/21 07/30/21  Josue Hector, MD  Multiple Vitamins-Minerals (ADULT ONE DAILY GUMMIES PO) Take 2 tablets by mouth in the morning.    [provider]    Allergies as of 05/02/2021 - Review Complete 05/02/2021  Allergen Reaction Noted   Fluconazole  04/16/2021    Family History  Problem Relation Age of Onset   Rheum arthritis Mother    Bladder Cancer Mother     Kidney disease Father    Bladder Cancer Maternal Grandfather    Irritable bowel syndrome Paternal Grandmother    Colon cancer Neg Hx    Celiac disease Neg Hx    Inflammatory bowel disease Neg Hx     Social History   Socioeconomic History   Marital status: Divorced    Spouse name: Not on file   Number of children: Not on file   Years of education: Not on file   Highest education level: Not on file  Occupational History   Not on file  Tobacco Use   Smoking status: Former    Packs/day: 0.00    Years: 0.00    Pack years: 0.00    Types: Cigarettes    Quit date: 11/03/2019    Years since quitting: 1.5   Smokeless tobacco: Never   Tobacco comments:    nicoten gum  Vaping Use   Vaping Use: Former   Quit date: 03/07/2019  Substance and Sexual Activity   Alcohol use: No   Drug use: Not Currently   Sexual activity: Not Currently    Partners: Female  Other Topics Concern   Not on file  Social History Narrative   Not on file   Social Determinants of Health   Financial Resource Strain: Not on file  Food Insecurity: Not on file  Transportation Needs: Not on file  Physical Activity: Not on file  Stress: Not on file  Social Connections: Not on file  Intimate Partner Violence: Not on file    Review of Systems: See HPI, otherwise negative ROS  Physical Exam: Vital signs in last 24 hours: Temp:  [98.3 F (36.8 C)] 98.3 F (36.8 C) (08/05 1210) Pulse Rate:  [57] 57 (08/05 1210) Resp:  [11] 11 (08/05 1210) SpO2:  [98 %] 98 % (08/05 1210)   General:   Alert,  Well-developed, well-nourished, pleasant and cooperative in NAD Head:  Normocephalic and atraumatic. Eyes:  Sclera clear, no icterus.   Conjunctiva pink. Ears:  Normal auditory acuity. Nose:  No deformity, discharge,  or lesions. Mouth:  No deformity or lesions, dentition normal. Neck:  Supple; no masses or thyromegaly. Lungs:  Clear throughout to auscultation.   No wheezes, crackles, or rhonchi. No acute  distress. Heart:  Regular rate and rhythm; no murmurs, clicks, rubs,  or gallops. Abdomen:  Soft, nontender and nondistended. No masses, hepatosplenomegaly or hernias noted. Normal bowel sounds, without guarding, and without rebound.   Msk:  Symmetrical without gross deformities. Normal posture. Extremities:  Without clubbing or edema. Neurologic:  Alert and  oriented x4;  grossly normal neurologically. Skin:  Intact without significant lesions or rashes. Cervical Nodes:  No significant cervical adenopathy. Psych:  Alert and cooperative. Normal mood and affect.  Impression/Plan: Logan Benton is here for a colonoscopy to be performed Due to change in bowel habits, rectal bleeding, mucus in stool.  The risks of the procedure including infection, bleed, or perforation as  well as benefits, limitations, alternatives and imponderables have been reviewed with the patient. Questions have been answered. All parties agreeable.

## 2021-05-14 ENCOUNTER — Ambulatory Visit: Payer: 59 | Admitting: Gastroenterology

## 2021-05-14 ENCOUNTER — Ambulatory Visit (HOSPITAL_COMMUNITY): Payer: 59

## 2021-05-14 ENCOUNTER — Telehealth (HOSPITAL_COMMUNITY): Payer: Self-pay | Admitting: Emergency Medicine

## 2021-05-14 LAB — SURGICAL PATHOLOGY

## 2021-05-14 NOTE — Telephone Encounter (Signed)
Attempted to call patient regarding upcoming cardiac CT appointment. °Left message on voicemail with name and callback number °Halo Laski RN Navigator Cardiac Imaging °[facility] °[phone] Office °[phone] Cell ° °

## 2021-05-15 ENCOUNTER — Other Ambulatory Visit: Payer: Self-pay

## 2021-05-15 ENCOUNTER — Ambulatory Visit (HOSPITAL_COMMUNITY)
Admission: RE | Admit: 2021-05-15 | Discharge: 2021-05-15 | Disposition: A | Discharge status: Home / Self Care | Payer: 59 | Source: Ambulatory Visit | Attending: Cardiovascular Disease | Admitting: Cardiovascular Disease

## 2021-05-15 ENCOUNTER — Other Ambulatory Visit: Payer: Self-pay | Admitting: Internal Medicine

## 2021-05-15 DIAGNOSIS — R079 Chest pain, unspecified: Secondary | ICD-10-CM | POA: Insufficient documentation

## 2021-05-15 DIAGNOSIS — J312 Chronic pharyngitis: Secondary | ICD-10-CM

## 2021-05-15 MED ORDER — IOHEXOL 350 MG/ML SOLN
100.0000 mL | Freq: Once | INTRAVENOUS | Status: AC | PRN
  Administered 2021-05-15: 100 mL via INTRAVENOUS

## 2021-05-15 MED ORDER — NITROGLYCERIN 0.4 MG SL SUBL
SUBLINGUAL_TABLET | SUBLINGUAL | Status: AC
  Filled 2021-05-15: qty 2

## 2021-05-15 MED ORDER — NITROGLYCERIN 0.4 MG SL SUBL
0.8000 mg | SUBLINGUAL_TABLET | Freq: Once | SUBLINGUAL | Status: AC
  Administered 2021-05-15: 0.8 mg via SUBLINGUAL

## 2021-05-17 LAB — FUNGUS CULTURE, BLOOD

## 2021-05-20 ENCOUNTER — Encounter (HOSPITAL_COMMUNITY): Payer: Self-pay | Admitting: Internal Medicine

## 2021-05-22 ENCOUNTER — Encounter: Payer: Self-pay | Admitting: Internal Medicine

## 2021-05-22 DIAGNOSIS — B370 Candidal stomatitis: Secondary | ICD-10-CM

## 2021-05-29 ENCOUNTER — Ambulatory Visit: Payer: 59 | Admitting: Internal Medicine

## 2021-05-31 ENCOUNTER — Other Ambulatory Visit: Payer: Self-pay | Admitting: Internal Medicine

## 2021-05-31 DIAGNOSIS — B370 Candidal stomatitis: Secondary | ICD-10-CM

## 2021-05-31 MED ORDER — VORICONAZOLE 200 MG PO TABS: 200.0000 mg | ORAL_TABLET | Freq: Two times a day (BID) | ORAL | 0 refills | Status: DC

## 2021-06-10 ENCOUNTER — Encounter: Payer: Self-pay | Admitting: Internal Medicine

## 2021-06-10 DIAGNOSIS — B370 Candidal stomatitis: Secondary | ICD-10-CM

## 2021-06-11 ENCOUNTER — Other Ambulatory Visit: Payer: Self-pay | Admitting: Internal Medicine

## 2021-06-11 DIAGNOSIS — B370 Candidal stomatitis: Secondary | ICD-10-CM

## 2021-06-11 MED ORDER — FLUCONAZOLE 200 MG PO TABS: 200.0000 mg | ORAL_TABLET | Freq: Every day | ORAL | 0 refills | Status: DC

## 2021-06-18 ENCOUNTER — Other Ambulatory Visit: Payer: Self-pay

## 2021-06-18 ENCOUNTER — Ambulatory Visit (INDEPENDENT_AMBULATORY_CARE_PROVIDER_SITE_OTHER): Payer: 59 | Admitting: Otolaryngology

## 2021-06-18 DIAGNOSIS — J029 Acute pharyngitis, unspecified: Secondary | ICD-10-CM | POA: Diagnosis not present

## 2021-06-18 NOTE — Progress Notes (Signed)
HPI: Logan Benton is a 40 y.o. male who presents is referred by by his PCP for evaluation of sore throat and questionable fracture.  This started about 2 months ago following treatment for worms that he got from a stray cat.  This can [place] of antiparasitic medication as well as doxycycline.  He separately developed sore throat with what appeared to be possible thrush.  He was placed on nystatin mouth rinses as well as using antifungal tablet medication.  He just recently completed a course of antifungal medication that he took for a week this past weekend.  His throat is feeling better..  Past Medical History:  Diagnosis Date   Abscessed tooth    Anxiety    C. difficile diarrhea    presumptive diagnosis   Depression    Hypertension    Trigger finger    Past Surgical History:  Procedure Laterality Date   BILATERAL CARPAL TUNNEL RELEASE     BIOPSY  06/29/2020   Procedure: BIOPSY;  Surgeon: Eloise Harman, DO;  [facility];  Service: Endoscopy;;   BIOPSY  09/03/2020   Procedure: BIOPSY;  Surgeon: Eloise Harman, DO;  [facility];  Service: Endoscopy;;   BIOPSY  03/08/2021   Procedure: BIOPSY;  Surgeon: Eloise Harman, DO;  [facility];  Service: Endoscopy;;   BIOPSY  05/10/2021   Procedure: BIOPSY;  Surgeon: Eloise Harman, DO;  [facility];  Service: Endoscopy;;   COLONOSCOPY WITH PROPOFOL N/A 06/29/2020   Dr. Abbey Chatters: 4 mm tubular adenoma removed, nonbleeding internal hemorrhoids.  Repeat colonoscopy in 7 years.   COLONOSCOPY WITH PROPOFOL N/A 05/10/2021   Procedure: COLONOSCOPY WITH PROPOFOL;  Surgeon: Eloise Harman, DO;  [facility];  Service: Endoscopy;  Laterality: N/A;  1:15pm   ESOPHAGOGASTRODUODENOSCOPY (EGD) WITH PROPOFOL N/A 06/29/2020   Dr. Abbey Chatters: LA grade C esophagitis, gastritis with mild chronic gastritis on path, small bowel biopsy unremarkable.   ESOPHAGOGASTRODUODENOSCOPY (EGD) WITH PROPOFOL N/A  09/03/2020   Dr. Abbey Chatters: Esophageal mucosal changes suggestive of short segment Barrett's esophagus, biopsies consistent.  Next EGD in 3 years   ESOPHAGOGASTRODUODENOSCOPY (EGD) WITH PROPOFOL N/A 03/08/2021   Procedure: ESOPHAGOGASTRODUODENOSCOPY (EGD) WITH PROPOFOL;  Surgeon: Eloise Harman, DO;  [facility];  Service: Endoscopy;  Laterality: N/A;  1:00pm   POLYPECTOMY  06/29/2020   Procedure: POLYPECTOMY;  Surgeon: Eloise Harman, DO;  [facility];  Service: Endoscopy;;   SHOULDER SURGERY Right    x2   TRIGGER FINGER RELEASE Right 2016   Social History   Socioeconomic History   Marital status: Divorced    Spouse name: Not on file   Number of children: Not on file   Years of education: Not on file   Highest education level: Not on file  Occupational History   Not on file  Tobacco Use   Smoking status: Former    Packs/day: 0.00    Years: 0.00    Pack years: 0.00    Types: Cigarettes    Quit date: 11/03/2019    Years since quitting: 1.6   Smokeless tobacco: Never   Tobacco comments:    nicoten gum  Vaping Use   Vaping Use: Former   Quit date: 03/07/2019  Substance and Sexual Activity   Alcohol use: No   Drug use: Not Currently   Sexual activity: Not Currently    Partners: Female  Other Topics Concern   Not on file  Social History Narrative   Not on file   Social Determinants of Health   Financial Resource Strain: Not on file  Food Insecurity: Not on file  Transportation Needs: Not on file  Physical Activity: Not on file  Stress: Not on file  Social Connections: Not on file   Family History  Problem Relation Age of Onset   Rheum arthritis Mother    Bladder Cancer Mother    Kidney disease Father    Bladder Cancer Maternal Grandfather    Irritable bowel syndrome Paternal Grandmother    Colon cancer Neg Hx    Celiac disease Neg Hx    Inflammatory bowel disease Neg Hx    No Active Allergies Prior to Admission medications   Medication  Sig Start Date End Date Taking? Authorizing Provider  acyclovir (ZOVIRAX) 800 MG tablet Take 1 tablet (800 mg total) by mouth 5 (five) times daily. Patient taking differently: Take 800 mg by mouth See admin instructions. Take '800mg'$  five times a day during outbreaks. 04/25/21   Fayrene Helper, MD  Ascorbic Acid (VITAMIN C PO) Take 1 tablet by mouth in the morning, at noon, and at bedtime.    [provider]  baclofen (LIORESAL) 10 MG tablet Take 1 tablet (10 mg total) by mouth 2 (two) times daily. 05/09/21   Lindell Spar, MD  fluconazole (DIFLUCAN) 200 MG tablet Take 1 tablet (200 mg total) by mouth daily. 06/11/21   Lindell Spar, MD  lidocaine (XYLOCAINE) 2 % solution Use as directed 15 mLs in the mouth or throat every 6 (six) hours as needed for mouth pain. 04/25/21   Fayrene Helper, MD  lisinopril (ZESTRIL) 5 MG tablet Take 1 tablet (5 mg total) by mouth at bedtime. 04/29/21   Fayrene Helper, MD  metoprolol tartrate (LOPRESSOR) 100 MG tablet Take 1 tablet (100 mg total) by mouth as directed. Patient not taking: No sig reported 05/01/21 07/30/21  Josue Hector, MD  Multiple Vitamin (MULTIVITAMIN WITH MINERALS) TABS tablet Take 1 tablet by mouth daily.    [provider]  Multiple Vitamins-Minerals (ADULT ONE DAILY GUMMIES PO) Take 2 tablets by mouth in the morning.    [provider]  nystatin (MYCOSTATIN) 100000 UNIT/ML suspension Take 5 mLs (500,000 Units total) by mouth 4 (four) times daily. Swish and swallow 04/18/21   Annitta Needs, NP  omeprazole (PRILOSEC) 20 MG capsule Take 20 mg by mouth in the morning.    [provider]  polyethylene glycol-electrolytes (NULYTELY) 420 g solution As directed 05/02/21   Annitta Needs, NP  Probiotic Product (PROBIOTIC-10 PO) Take 1 capsule by mouth daily.    [provider]     Positive ROS: Otherwise negative  All other systems have been reviewed and were otherwise negative with the exception of  those mentioned in the HPI and as above.  Physical Exam: Constitutional: Alert, well-appearing, no acute distress Ears: External ears without lesions or tenderness. Ear canals are clear bilaterally with intact, clear TMs.  Nasal: External nose without lesions.. Clear nasal passages Oral: Lips and gums without lesions. Tongue and palate mucosa without lesions.  Tongue appears normal with no excessive exudate.  The buccal and palate mucosa with smooth with no mucosal abnormalities noted.  Posterior oropharyngeal wall was actually fairly clear with no exudate or appearance of Candida at this time.  Indirect laryngoscopy revealed a clear base of tongue vallecula and epiglottis.  Hypopharyngeal interarytenoid mucosa was clear with no mucosal abnormalities  noted. Neck: No palpable adenopathy or masses Respiratory: Breathing comfortably  Skin: No facial/neck lesions or rash noted.  Procedures  Assessment: On clinical exam in the office today no clinical evidence of obvious thrush.  Overall his symptoms are doing better.  Plan: Recommended oral hygiene with gargling with antiseptic such as Listerine or scope and use of yogurt which should help with normal oropharyngeal fungus and bacteria.  I would stop the antifungal medication which he has completed it did not prescribe any new medications. He will follow-up if he has any exacerbation of the sore throat as needed.   Radene Journey, MD   CC:

## 2021-06-19 ENCOUNTER — Encounter: Payer: Self-pay | Admitting: Internal Medicine

## 2021-06-19 ENCOUNTER — Ambulatory Visit (INDEPENDENT_AMBULATORY_CARE_PROVIDER_SITE_OTHER): Payer: 59 | Admitting: Internal Medicine

## 2021-06-19 VITALS — BP 135/82 | HR 69 | Temp 97.7°F | Resp 18 | Ht 71.0 in | Wt 153.0 lb

## 2021-06-19 DIAGNOSIS — M940 Chondrocostal junction syndrome [Tietze]: Secondary | ICD-10-CM | POA: Diagnosis not present

## 2021-06-19 DIAGNOSIS — B80 Enterobiasis: Secondary | ICD-10-CM | POA: Diagnosis not present

## 2021-06-19 DIAGNOSIS — F411 Generalized anxiety disorder: Secondary | ICD-10-CM | POA: Diagnosis not present

## 2021-06-19 DIAGNOSIS — I10 Essential (primary) hypertension: Secondary | ICD-10-CM | POA: Diagnosis not present

## 2021-06-19 MED ORDER — MEBENDAZOLE 100 MG PO CHEW: 100.0000 mg | CHEWABLE_TABLET | Freq: Once | ORAL | 0 refills | Status: AC

## 2021-06-19 MED ORDER — LISINOPRIL 5 MG PO TABS: 5.0000 mg | ORAL_TABLET | Freq: Every day | ORAL | 5 refills | Status: DC

## 2021-06-19 NOTE — Patient Instructions (Signed)
Please continue to take Lisinopril as prescribed.  Please take Mebendazole once today and after 3 weeks.

## 2021-06-19 NOTE — Progress Notes (Signed)
Established Patient Office Visit  Subjective:  Patient ID: Logan Benton, male    DOB: 1980/12/12  Age: 40 y.o. MRN: [id number]  CC:  Chief Complaint  Patient presents with   Follow-up    3 month follow up pt started having headache fatigue chest pain and stiffness in neck 06-18-21    HPI Logan Benton is a 40 year old male with PMH of HTN, incomplete RBBB, sciatica and intestinal worm infestation presents for follow up of his chronic medical conditions.  HTN: BP is well-controlled. Takes medications regularly. Patient denies headache, dizziness, dyspnea or palpitations.  He reports having left sided chest wall pain, which started since yesterday. Denies any recent URTI. Denies any recent injury or lifting weight. He attributes it to anxiety.  He reports having diarrhea and noticing worms near anal orifice. His brother is being treated for pinworm infection. He has had similar infection in the past as well.  He had visit with ENT specialist for chronic sore throat and was told to stop Fluconazole. I advised him that his symptoms are more likely related to anxiety. But he is not willing to start anxiolytic currently. He has had Celexa, Prozac and other antidepressants in the past and did not tolerate them. He prefers to wait for [facility] therapy for now.   Past Medical History:  Diagnosis Date   Abscessed tooth    Anxiety    C. difficile diarrhea    presumptive diagnosis   Depression    Hypertension    Trigger finger     Past Surgical History:  Procedure Laterality Date   BILATERAL CARPAL TUNNEL RELEASE     BIOPSY  06/29/2020   Procedure: BIOPSY;  Surgeon: Eloise Harman, DO;  [facility];  Service: Endoscopy;;   BIOPSY  09/03/2020   Procedure: BIOPSY;  Surgeon: Eloise Harman, DO;  [facility];  Service: Endoscopy;;   BIOPSY  03/08/2021   Procedure: BIOPSY;  Surgeon: Eloise Harman, DO;  [facility];  Service: Endoscopy;;    BIOPSY  05/10/2021   Procedure: BIOPSY;  Surgeon: Eloise Harman, DO;  [facility];  Service: Endoscopy;;   COLONOSCOPY WITH PROPOFOL N/A 06/29/2020   Dr. Abbey Chatters: 4 mm tubular adenoma removed, nonbleeding internal hemorrhoids.  Repeat colonoscopy in 7 years.   COLONOSCOPY WITH PROPOFOL N/A 05/10/2021   Procedure: COLONOSCOPY WITH PROPOFOL;  Surgeon: Eloise Harman, DO;  [facility];  Service: Endoscopy;  Laterality: N/A;  1:15pm   ESOPHAGOGASTRODUODENOSCOPY (EGD) WITH PROPOFOL N/A 06/29/2020   Dr. Abbey Chatters: LA grade C esophagitis, gastritis with mild chronic gastritis on path, small bowel biopsy unremarkable.   ESOPHAGOGASTRODUODENOSCOPY (EGD) WITH PROPOFOL N/A 09/03/2020   Dr. Abbey Chatters: Esophageal mucosal changes suggestive of short segment Barrett's esophagus, biopsies consistent.  Next EGD in 3 years   ESOPHAGOGASTRODUODENOSCOPY (EGD) WITH PROPOFOL N/A 03/08/2021   Procedure: ESOPHAGOGASTRODUODENOSCOPY (EGD) WITH PROPOFOL;  Surgeon: Eloise Harman, DO;  [facility];  Service: Endoscopy;  Laterality: N/A;  1:00pm   POLYPECTOMY  06/29/2020   Procedure: POLYPECTOMY;  Surgeon: Eloise Harman, DO;  [facility];  Service: Endoscopy;;   SHOULDER SURGERY Right    x2   TRIGGER FINGER RELEASE Right 2016    Family History  Problem Relation Age of Onset   Rheum arthritis Mother    Bladder Cancer Mother    Kidney disease Father    Bladder Cancer Maternal Grandfather    Irritable bowel  syndrome Paternal Grandmother    Colon cancer Neg Hx    Celiac disease Neg Hx    Inflammatory bowel disease Neg Hx     Social History   Socioeconomic History   Marital status: Divorced    Spouse name: Not on file   Number of children: Not on file   Years of education: Not on file   Highest education level: Not on file  Occupational History   Not on file  Tobacco Use   Smoking status: Former    Packs/day: 0.00    Years: 0.00    Pack years: 0.00    Types:  Cigarettes    Quit date: 11/03/2019    Years since quitting: 1.6   Smokeless tobacco: Never   Tobacco comments:    nicoten gum  Vaping Use   Vaping Use: Former   Quit date: 03/07/2019  Substance and Sexual Activity   Alcohol use: No   Drug use: Not Currently   Sexual activity: Not Currently    Partners: Female  Other Topics Concern   Not on file  Social History Narrative   Not on file   Social Determinants of Health   Financial Resource Strain: Not on file  Food Insecurity: Not on file  Transportation Needs: Not on file  Physical Activity: Not on file  Stress: Not on file  Social Connections: Not on file  Intimate Partner Violence: Not on file    Outpatient Medications Prior to Visit  Medication Sig Dispense Refill   acyclovir (ZOVIRAX) 800 MG tablet Take 1 tablet (800 mg total) by mouth 5 (five) times daily. (Patient taking differently: Take 800 mg by mouth See admin instructions. Take 845m five times a day during outbreaks.) 50 tablet 0   Ascorbic Acid (VITAMIN C PO) Take 1 tablet by mouth in the morning, at noon, and at bedtime.     baclofen (LIORESAL) 10 MG tablet Take 1 tablet (10 mg total) by mouth 2 (two) times daily. 60 each 2   lidocaine (XYLOCAINE) 2 % solution Use as directed 15 mLs in the mouth or throat every 6 (six) hours as needed for mouth pain. 100 mL 0   metoprolol tartrate (LOPRESSOR) 100 MG tablet Take 1 tablet (100 mg total) by mouth as directed. 1 tablet 0   Multiple Vitamin (MULTIVITAMIN WITH MINERALS) TABS tablet Take 1 tablet by mouth daily.     Multiple Vitamins-Minerals (ADULT ONE DAILY GUMMIES PO) Take 2 tablets by mouth in the morning.     nystatin (MYCOSTATIN) 100000 UNIT/ML suspension Take 5 mLs (500,000 Units total) by mouth 4 (four) times daily. Swish and swallow 473 mL 2   omeprazole (PRILOSEC) 20 MG capsule Take 20 mg by mouth in the morning.     polyethylene glycol-electrolytes (NULYTELY) 420 g solution As directed 4000 mL 0   Probiotic  Product (PROBIOTIC-10 PO) Take 1 capsule by mouth daily.     fluconazole (DIFLUCAN) 200 MG tablet Take 1 tablet (200 mg total) by mouth daily. 7 tablet 0   lisinopril (ZESTRIL) 5 MG tablet Take 1 tablet (5 mg total) by mouth at bedtime. 30 tablet 3   No facility-administered medications prior to visit.    No Active Allergies  ROS Review of Systems  Constitutional:  Negative for chills and fever.  HENT:  Negative for congestion and sore throat.   Eyes:  Negative for pain and discharge.  Respiratory:  Negative for cough and shortness of breath.   Cardiovascular:  Negative for palpitations.  Left sided chest wall pain  Gastrointestinal:  Positive for diarrhea. Negative for constipation, nausea and vomiting.  Endocrine: Negative for polydipsia and polyuria.  Genitourinary:  Negative for dysuria and hematuria.  Musculoskeletal:  Negative for neck pain and neck stiffness.  Skin:  Negative for rash.  Neurological:  Negative for dizziness, weakness, numbness and headaches.  Psychiatric/Behavioral:  Negative for agitation and behavioral problems. The patient is nervous/anxious.      Objective:    Physical Exam Vitals reviewed.  Constitutional:      General: He is not in acute distress.    Appearance: He is not diaphoretic.  HENT:     Head: Normocephalic and atraumatic.     Nose: Nose normal.     Mouth/Throat:     Mouth: Mucous membranes are moist.     Pharynx: No posterior oropharyngeal erythema.  Eyes:     General: No scleral icterus.    Extraocular Movements: Extraocular movements intact.  Cardiovascular:     Rate and Rhythm: Normal rate and regular rhythm.     Pulses: Normal pulses.     Heart sounds: Normal heart sounds. No murmur heard. Pulmonary:     Breath sounds: Normal breath sounds. No wheezing or rales.  Chest:     Chest wall: Tenderness present.  Abdominal:     Palpations: Abdomen is soft.     Tenderness: There is no abdominal tenderness.  Musculoskeletal:      Cervical back: Neck supple. No tenderness.     Right lower leg: No edema.     Left lower leg: No edema.  Skin:    General: Skin is warm.     Findings: No rash.  Neurological:     General: No focal deficit present.     Mental Status: He is alert and oriented to person, place, and time.  Psychiatric:        Mood and Affect: Mood normal.        Behavior: Behavior normal.    BP 135/82 (BP Location: Left Arm, Patient Position: Sitting, Cuff Size: Normal)   Pulse 69   Temp 97.7 F (36.5 C) (Oral)   Resp 18   Ht '5\' 11"'  (1.803 m)   Wt 153 lb 0.6 oz (69.4 kg)   SpO2 99%   BMI 21.34 kg/m  Wt Readings from Last 3 Encounters:  06/19/21 153 lb 0.6 oz (69.4 kg)  05/02/21 156 lb 1.9 oz (70.8 kg)  05/01/21 154 lb (69.9 kg)     Health Maintenance Due  Topic Date Due   Pneumococcal Vaccine 52-32 Years old (1 - PCV) Never done   COVID-19 Vaccine (3 - Moderna risk series) 05/08/2020    There are no preventive care reminders to display for this patient.  Lab Results  Component Value Date   TSH 0.720 10/21/2019   Lab Results  Component Value Date   WBC 5.4 02/11/2021   HGB 15.9 02/11/2021   HCT 47.9 02/11/2021   MCV 97.6 02/11/2021   PLT 243 02/11/2021   Lab Results  Component Value Date   NA 141 04/16/2021   K 4.5 04/16/2021   CO2 23 04/16/2021   GLUCOSE 90 04/16/2021   BUN 15 04/16/2021   CREATININE 1.11 04/16/2021   BILITOT 0.5 04/16/2021   ALKPHOS 58 04/16/2021   AST 27 04/16/2021   ALT 35 04/16/2021   PROT 6.9 04/16/2021   ALBUMIN 4.8 04/16/2021   CALCIUM 10.0 04/16/2021   ANIONGAP 7 02/11/2021   EGFR 88 04/16/2021  No results found for: CHOL No results found for: HDL No results found for: LDLCALC No results found for: TRIG No results found for: CHOLHDL No results found for: HGBA1C    Assessment & Plan:   Problem List Items Addressed This Visit       Cardiovascular and Mediastinum   Primary hypertension - Primary    BP Readings from Last 1  Encounters:  06/19/21 135/82  Well-controlled Counseled for compliance with the medications Advised DASH diet and moderate exercise/walking, at least 150 mins/week       Relevant Medications   lisinopril (ZESTRIL) 5 MG tablet   Other Visit Diagnoses     Pinworm infection       Relevant Medications   mebendazole (VERMOX) 100 MG chewable tablet   Acute costochondritis     Tenderness suggests likely costochondritis Advised to take Ibuprofen PRN Avoid heavy lifting Work note provided for today as requested    GAD (generalized anxiety disorder)     His most of the chronic symptoms are more likely related to anxiety overall I advised him that his anxiety needs to be treated, but he prefers to avoid anxiolytic for now, denied [place] therapy referral today Insomnia also affecting daytime fatigue       Meds ordered this encounter  Medications   lisinopril (ZESTRIL) 5 MG tablet    Sig: Take 1 tablet (5 mg total) by mouth at bedtime.    Dispense:  30 tablet    Refill:  5   mebendazole (VERMOX) 100 MG chewable tablet    Sig: Chew 1 tablet (100 mg total) by mouth once for 1 dose. Take second dose after 3 weeks.    Dispense:  2 tablet    Refill:  0    Follow-up: Return in about 4 months (around 10/19/2021).    Lindell Spar, MD

## 2021-06-19 NOTE — Assessment & Plan Note (Signed)
BP Readings from Last 1 Encounters:  06/19/21 135/82   Well-controlled Counseled for compliance with the medications Advised DASH diet and moderate exercise/walking, at least 150 mins/week

## 2021-06-25 ENCOUNTER — Encounter: Payer: Self-pay | Admitting: Internal Medicine

## 2021-06-25 ENCOUNTER — Other Ambulatory Visit: Payer: Self-pay | Admitting: Internal Medicine

## 2021-06-25 DIAGNOSIS — B80 Enterobiasis: Secondary | ICD-10-CM

## 2021-06-25 DIAGNOSIS — F411 Generalized anxiety disorder: Secondary | ICD-10-CM

## 2021-06-25 DIAGNOSIS — R197 Diarrhea, unspecified: Secondary | ICD-10-CM

## 2021-06-28 ENCOUNTER — Encounter: Payer: Self-pay | Admitting: Internal Medicine

## 2021-06-28 NOTE — Telephone Encounter (Signed)
Please schedule pt a virtual appt to discuss with Dr Posey Pronto

## 2021-06-28 NOTE — Telephone Encounter (Signed)
Appointment scheduled.

## 2021-06-29 LAB — STRONGYLOIDES, AB, IGG: Strongyloides, Ab, IgG: NEGATIVE

## 2021-07-02 ENCOUNTER — Encounter: Payer: Self-pay | Admitting: Internal Medicine

## 2021-07-05 ENCOUNTER — Encounter: Payer: Self-pay | Admitting: Internal Medicine

## 2021-07-08 NOTE — Telephone Encounter (Signed)
DONE

## 2021-07-08 NOTE — Telephone Encounter (Signed)
done

## 2021-07-09 ENCOUNTER — Ambulatory Visit (INDEPENDENT_AMBULATORY_CARE_PROVIDER_SITE_OTHER): Payer: 59 | Admitting: Internal Medicine

## 2021-07-09 ENCOUNTER — Other Ambulatory Visit: Payer: Self-pay

## 2021-07-09 ENCOUNTER — Ambulatory Visit: Payer: 59 | Admitting: Gastroenterology

## 2021-07-09 ENCOUNTER — Encounter: Payer: Self-pay | Admitting: Internal Medicine

## 2021-07-09 VITALS — BP 142/92 | HR 65 | Temp 98.3°F | Resp 18 | Ht 71.0 in | Wt 152.1 lb

## 2021-07-09 DIAGNOSIS — I10 Essential (primary) hypertension: Secondary | ICD-10-CM | POA: Diagnosis not present

## 2021-07-09 DIAGNOSIS — M25511 Pain in right shoulder: Secondary | ICD-10-CM | POA: Diagnosis not present

## 2021-07-09 DIAGNOSIS — B370 Candidal stomatitis: Secondary | ICD-10-CM

## 2021-07-09 MED ORDER — FLUCONAZOLE 200 MG PO TABS: 200.0000 mg | ORAL_TABLET | Freq: Every day | ORAL | 0 refills | Status: DC

## 2021-07-09 NOTE — Assessment & Plan Note (Signed)
H/o shoulder surgeries in the past Referred to Orthopedic surgery

## 2021-07-09 NOTE — Assessment & Plan Note (Signed)
Exudates seen C/o dysphagia Will empirically treat for fungal esophagitis as well Check throat culture

## 2021-07-09 NOTE — Assessment & Plan Note (Signed)
BP Readings from Last 1 Encounters:  07/09/21 (!) 142/92   Well-controlled overall, elevated today - could be elevated today due to being anxious Counseled for compliance with the medications Advised DASH diet and moderate exercise/walking, at least 150 mins/week

## 2021-07-09 NOTE — Progress Notes (Signed)
Acute Office Visit  Subjective:    Patient ID: Logan Benton, male    DOB: 1981-03-02, 40 y.o.   MRN: [id number]  Chief Complaint  Patient presents with   Acute Visit    Pt is complaining of thrush in his mouth and throat and stomach itching also hurt right shoulder on 06-29-21 lifting weights now cant lift or move it very much saw ENT regarding the thrush in the past     HPI Patient is in today for c/o recurrent thrush in his throat. He has been having swallowing difficulty and fatigue at times. Denies any fever, chills, nausea or vomiting. He has had ID, GI and ENT evaluation for recurrent candidiasis.  He has been having right shoulder pain for the last 10 days.  He has a history of biceps tendon repair surgery in the past.  Of note, he does weightlifting, but has not been able to do it due to shoulder pain.  He denies any numbness or weakness of the right UE.  Past Medical History:  Diagnosis Date   Abscessed tooth    Anxiety    C. difficile diarrhea    presumptive diagnosis   Depression    Hypertension    Trigger finger     Past Surgical History:  Procedure Laterality Date   BILATERAL CARPAL TUNNEL RELEASE     BIOPSY  06/29/2020   Procedure: BIOPSY;  Surgeon: Eloise Harman, DO;  [facility];  Service: Endoscopy;;   BIOPSY  09/03/2020   Procedure: BIOPSY;  Surgeon: Eloise Harman, DO;  [facility];  Service: Endoscopy;;   BIOPSY  03/08/2021   Procedure: BIOPSY;  Surgeon: Eloise Harman, DO;  [facility];  Service: Endoscopy;;   BIOPSY  05/10/2021   Procedure: BIOPSY;  Surgeon: Eloise Harman, DO;  [facility];  Service: Endoscopy;;   COLONOSCOPY WITH PROPOFOL N/A 06/29/2020   Dr. Abbey Chatters: 4 mm tubular adenoma removed, nonbleeding internal hemorrhoids.  Repeat colonoscopy in 7 years.   COLONOSCOPY WITH PROPOFOL N/A 05/10/2021   Procedure: COLONOSCOPY WITH PROPOFOL;  Surgeon: Eloise Harman, DO;  [facility];  Service: Endoscopy;  Laterality: N/A;  1:15pm   ESOPHAGOGASTRODUODENOSCOPY (EGD) WITH PROPOFOL N/A 06/29/2020   Dr. Abbey Chatters: LA grade C esophagitis, gastritis with mild chronic gastritis on path, small bowel biopsy unremarkable.   ESOPHAGOGASTRODUODENOSCOPY (EGD) WITH PROPOFOL N/A 09/03/2020   Dr. Abbey Chatters: Esophageal mucosal changes suggestive of short segment Barrett's esophagus, biopsies consistent.  Next EGD in 3 years   ESOPHAGOGASTRODUODENOSCOPY (EGD) WITH PROPOFOL N/A 03/08/2021   Procedure: ESOPHAGOGASTRODUODENOSCOPY (EGD) WITH PROPOFOL;  Surgeon: Eloise Harman, DO;  [facility];  Service: Endoscopy;  Laterality: N/A;  1:00pm   POLYPECTOMY  06/29/2020   Procedure: POLYPECTOMY;  Surgeon: Eloise Harman, DO;  [facility];  Service: Endoscopy;;   SHOULDER SURGERY Right    x2   TRIGGER FINGER RELEASE Right 2016    Family History  Problem Relation Age of Onset   Rheum arthritis Mother    Bladder Cancer Mother    Kidney disease Father    Bladder Cancer Maternal Grandfather    Irritable bowel syndrome Paternal Grandmother    Colon cancer Neg Hx    Celiac disease Neg Hx    Inflammatory bowel disease Neg Hx     Social History   Socioeconomic History   Marital status: Divorced    Spouse name: Not on file  Number of children: Not on file   Years of education: Not on file   Highest education level: Not on file  Occupational History   Not on file  Tobacco Use   Smoking status: Former    Packs/day: 0.00    Years: 0.00    Pack years: 0.00    Types: Cigarettes    Quit date: 11/03/2019    Years since quitting: 1.6   Smokeless tobacco: Never   Tobacco comments:    nicoten gum  Vaping Use   Vaping Use: Former   Quit date: 03/07/2019  Substance and Sexual Activity   Alcohol use: No   Drug use: Not Currently   Sexual activity: Not Currently    Partners: Female  Other Topics Concern   Not on file  Social History Narrative   Not on file    Social Determinants of Health   Financial Resource Strain: Not on file  Food Insecurity: Not on file  Transportation Needs: Not on file  Physical Activity: Not on file  Stress: Not on file  Social Connections: Not on file  Intimate Partner Violence: Not on file    Outpatient Medications Prior to Visit  Medication Sig Dispense Refill   Ascorbic Acid (VITAMIN C PO) Take 1 tablet by mouth in the morning, at noon, and at bedtime.     baclofen (LIORESAL) 10 MG tablet Take 1 tablet (10 mg total) by mouth 2 (two) times daily. 60 each 2   lidocaine (XYLOCAINE) 2 % solution Use as directed 15 mLs in the mouth or throat every 6 (six) hours as needed for mouth pain. 100 mL 0   lisinopril (ZESTRIL) 5 MG tablet Take 1 tablet (5 mg total) by mouth at bedtime. 30 tablet 5   metoprolol tartrate (LOPRESSOR) 100 MG tablet Take 1 tablet (100 mg total) by mouth as directed. 1 tablet 0   Multiple Vitamin (MULTIVITAMIN WITH MINERALS) TABS tablet Take 1 tablet by mouth daily.     Multiple Vitamins-Minerals (ADULT ONE DAILY GUMMIES PO) Take 2 tablets by mouth in the morning.     nystatin (MYCOSTATIN) 100000 UNIT/ML suspension Take 5 mLs (500,000 Units total) by mouth 4 (four) times daily. Swish and swallow 473 mL 2   omeprazole (PRILOSEC) 20 MG capsule Take 20 mg by mouth in the morning.     Probiotic Product (PROBIOTIC-10 PO) Take 1 capsule by mouth daily.     acyclovir (ZOVIRAX) 800 MG tablet Take 1 tablet (800 mg total) by mouth 5 (five) times daily. (Patient not taking: Reported on 07/09/2021) 50 tablet 0   fluconazole (DIFLUCAN) 100 MG tablet Take 100 mg by mouth daily. (Patient not taking: Reported on 07/09/2021)     polyethylene glycol-electrolytes (NULYTELY) 420 g solution As directed (Patient not taking: Reported on 07/09/2021) 4000 mL 0   No facility-administered medications prior to visit.    No Active Allergies  Review of Systems  Constitutional:  Negative for chills and fever.  HENT:   Positive for mouth sores. Negative for congestion and sore throat.   Eyes:  Negative for pain and discharge.  Respiratory:  Negative for cough and shortness of breath.   Cardiovascular:  Negative for chest pain and palpitations.  Gastrointestinal:  Positive for abdominal pain. Negative for constipation, diarrhea, nausea and vomiting.  Endocrine: Negative for polydipsia and polyuria.  Genitourinary:  Negative for dysuria and hematuria.  Musculoskeletal:  Negative for neck pain and neck stiffness.  Skin:  Negative for rash.  Neurological:  Negative  for dizziness, weakness, numbness and headaches.  Psychiatric/Behavioral:  Negative for agitation and behavioral problems.       Objective:    Physical Exam Vitals reviewed.  Constitutional:      General: He is not in acute distress.    Appearance: He is not diaphoretic.  HENT:     Head: Normocephalic and atraumatic.     Nose: Nose normal.     Mouth/Throat:     Pharynx: Oropharyngeal exudate present. No posterior oropharyngeal erythema.  Eyes:     General: No scleral icterus.    Extraocular Movements: Extraocular movements intact.  Cardiovascular:     Rate and Rhythm: Normal rate and regular rhythm.     Pulses: Normal pulses.     Heart sounds: Normal heart sounds. No murmur heard. Pulmonary:     Breath sounds: Normal breath sounds. No wheezing or rales.  Abdominal:     Palpations: Abdomen is soft.     Tenderness: There is no abdominal tenderness.  Musculoskeletal:     Cervical back: Neck supple. No tenderness.     Right lower leg: No edema.     Left lower leg: No edema.  Skin:    General: Skin is warm.     Findings: No rash.  Neurological:     General: No focal deficit present.     Mental Status: He is alert and oriented to person, place, and time.  Psychiatric:        Mood and Affect: Mood normal.        Behavior: Behavior normal.    BP (!) 142/92 (BP Location: Right Arm, Patient Position: Sitting, Cuff Size: Normal)    Pulse 65   Temp 98.3 F (36.8 C) (Oral)   Resp 18   Ht '5\' 11"'  (1.803 m)   Wt 152 lb 1.9 oz (69 kg)   SpO2 98%   BMI 21.22 kg/m  Wt Readings from Last 3 Encounters:  07/09/21 152 lb 1.9 oz (69 kg)  06/19/21 153 lb 0.6 oz (69.4 kg)  05/02/21 156 lb 1.9 oz (70.8 kg)    Health Maintenance Due  Topic Date Due   COVID-19 Vaccine (3 - Booster for Moderna series) 09/10/2020    There are no preventive care reminders to display for this patient.   Lab Results  Component Value Date   TSH 0.720 10/21/2019   Lab Results  Component Value Date   WBC 5.4 02/11/2021   HGB 15.9 02/11/2021   HCT 47.9 02/11/2021   MCV 97.6 02/11/2021   PLT 243 02/11/2021   Lab Results  Component Value Date   NA 141 04/16/2021   K 4.5 04/16/2021   CO2 23 04/16/2021   GLUCOSE 90 04/16/2021   BUN 15 04/16/2021   CREATININE 1.11 04/16/2021   BILITOT 0.5 04/16/2021   ALKPHOS 58 04/16/2021   AST 27 04/16/2021   ALT 35 04/16/2021   PROT 6.9 04/16/2021   ALBUMIN 4.8 04/16/2021   CALCIUM 10.0 04/16/2021   ANIONGAP 7 02/11/2021   EGFR 88 04/16/2021   No results found for: CHOL No results found for: HDL No results found for: LDLCALC No results found for: TRIG No results found for: CHOLHDL No results found for: HGBA1C     Assessment & Plan:   Problem List Items Addressed This Visit       Cardiovascular and Mediastinum   Primary hypertension    BP Readings from Last 1 Encounters:  07/09/21 (!) 142/92  Well-controlled overall, elevated today - could  be elevated today due to being anxious Counseled for compliance with the medications Advised DASH diet and moderate exercise/walking, at least 150 mins/week       Relevant Orders   CMP14+EGFR     Digestive   Oropharyngeal candidiasis - Primary    Exudates seen C/o dysphagia Will empirically treat for fungal esophagitis as well Check throat culture      Relevant Medications   fluconazole (DIFLUCAN) 200 MG tablet   Other Relevant  Orders   Fungus Culture W/Rfx Rapid ID     Other   Acute pain of right shoulder    H/o shoulder surgeries in the past Referred to Orthopedic surgery      Relevant Orders   Ambulatory referral to Orthopedic Surgery     Meds ordered this encounter  Medications   fluconazole (DIFLUCAN) 200 MG tablet    Sig: Take 1 tablet (200 mg total) by mouth daily.    Dispense:  14 tablet    Refill:  0     Adya Wirz Keith Rake, MD

## 2021-07-16 ENCOUNTER — Encounter: Payer: Self-pay | Admitting: Orthopedic Surgery

## 2021-07-16 ENCOUNTER — Other Ambulatory Visit: Payer: Self-pay

## 2021-07-16 ENCOUNTER — Ambulatory Visit: Payer: 59 | Admitting: Orthopedic Surgery

## 2021-07-16 ENCOUNTER — Ambulatory Visit: Payer: 59

## 2021-07-16 VITALS — BP 133/71 | HR 61 | Ht 71.0 in | Wt 160.0 lb

## 2021-07-16 DIAGNOSIS — M25511 Pain in right shoulder: Secondary | ICD-10-CM

## 2021-07-16 DIAGNOSIS — G8929 Other chronic pain: Secondary | ICD-10-CM

## 2021-07-16 NOTE — Patient Instructions (Addendum)
PT  Medications as needed  Avoid heavy lifting

## 2021-07-16 NOTE — Progress Notes (Signed)
New Patient Visit  Assessment: [facility] is a 40 y.o. male with the following: 1. Acute pain in right shoulder  Plan: Patient has pain in his right shoulder, with a history of 2 previous arthroscopies.  Although the mechanism does not fit, he has tenderness to palpation, mild deformity at the right [facility] joint.  As a result, he has no pain with range of motion, as well as strength in his right shoulder.  He does have tenderness to palpation overlying the AC joint.  At this point, would like to treated as if it is an [facility] joint sprain.  He is interested in pursuing physical therapy, and a referral was placed today.  He should continue with medications as needed.  Depending on the improvement in his symptoms, we could consider obtaining another MRI.  However, he has previously had a SLAP repair x2, as well as a biceps tenodesis.  We will see him back in approximate 4 weeks for repeat evaluation.  Medications as needed   Follow-up: Return in about 4 weeks (around 08/13/2021).  Subjective:  Chief Complaint  Patient presents with   Shoulder Pain    Rt shoulder pain after injury 3 wks. Pt states he was lifting weights and now when he tries to do any movement he has pain and some "crunching"     History of Present Illness: [facility] is a 40 y.o. male who has been referred to clinic by Ihor Dow, MD for evaluation of right shoulder pain.  Onset of pain was 3 weeks ago.  He was lifting weights overhead when he felt sharp pain over the superior shoulder.   He has been taking ibuprofen with minimal improvement.  He has a history of 2 separate shoulder surgeries including a SLAP repair x 2 as well as biceps tenodesis.  Following his most recent procedure he had no issues with his shoulder.  He is active and would like to remain active.  No recent PT   Review of Systems: No fevers or chills No numbness or tingling No chest pain No shortness of breath No bowel or bladder dysfunction No GI  distress No headaches   Medical History:  Past Medical History:  Diagnosis Date   Abscessed tooth    Anxiety    C. difficile diarrhea    presumptive diagnosis   Depression    Hypertension    Trigger finger     Past Surgical History:  Procedure Laterality Date   BILATERAL CARPAL TUNNEL RELEASE     BIOPSY  06/29/2020   Procedure: BIOPSY;  Surgeon: Eloise Harman, DO;  [facility];  Service: Endoscopy;;   BIOPSY  09/03/2020   Procedure: BIOPSY;  Surgeon: Eloise Harman, DO;  [facility];  Service: Endoscopy;;   BIOPSY  03/08/2021   Procedure: BIOPSY;  Surgeon: Eloise Harman, DO;  [facility];  Service: Endoscopy;;   BIOPSY  05/10/2021   Procedure: BIOPSY;  Surgeon: Eloise Harman, DO;  [facility];  Service: Endoscopy;;   COLONOSCOPY WITH PROPOFOL N/A 06/29/2020   Dr. Abbey Chatters: 4 mm tubular adenoma removed, nonbleeding internal hemorrhoids.  Repeat colonoscopy in 7 years.   COLONOSCOPY WITH PROPOFOL N/A 05/10/2021   Procedure: COLONOSCOPY WITH PROPOFOL;  Surgeon: Eloise Harman, DO;  [facility];  Service: Endoscopy;  Laterality: N/A;  1:15pm   ESOPHAGOGASTRODUODENOSCOPY (EGD) WITH PROPOFOL N/A 06/29/2020   Dr. Abbey Chatters: LA grade C esophagitis, gastritis with mild  chronic gastritis on path, small bowel biopsy unremarkable.   ESOPHAGOGASTRODUODENOSCOPY (EGD) WITH PROPOFOL N/A 09/03/2020   Dr. Abbey Chatters: Esophageal mucosal changes suggestive of short segment Barrett's esophagus, biopsies consistent.  Next EGD in 3 years   ESOPHAGOGASTRODUODENOSCOPY (EGD) WITH PROPOFOL N/A 03/08/2021   Procedure: ESOPHAGOGASTRODUODENOSCOPY (EGD) WITH PROPOFOL;  Surgeon: Eloise Harman, DO;  [facility];  Service: Endoscopy;  Laterality: N/A;  1:00pm   POLYPECTOMY  06/29/2020   Procedure: POLYPECTOMY;  Surgeon: Eloise Harman, DO;  [facility];  Service: Endoscopy;;   SHOULDER SURGERY Right    x2   TRIGGER FINGER  RELEASE Right 2016    Family History  Problem Relation Age of Onset   Rheum arthritis Mother    Bladder Cancer Mother    Kidney disease Father    Bladder Cancer Maternal Grandfather    Irritable bowel syndrome Paternal Grandmother    Colon cancer Neg Hx    Celiac disease Neg Hx    Inflammatory bowel disease Neg Hx    Social History   Tobacco Use   Smoking status: Former    Packs/day: 0.00    Years: 0.00    Pack years: 0.00    Types: Cigarettes    Quit date: 11/03/2019    Years since quitting: 1.7   Smokeless tobacco: Never   Tobacco comments:    nicoten gum  Vaping Use   Vaping Use: Former   Quit date: 03/07/2019  Substance Use Topics   Alcohol use: No   Drug use: Not Currently    No Active Allergies  Current Meds  Medication Sig   Ascorbic Acid (VITAMIN C PO) Take 1 tablet by mouth in the morning, at noon, and at bedtime.   baclofen (LIORESAL) 10 MG tablet Take 1 tablet (10 mg total) by mouth 2 (two) times daily.   fluconazole (DIFLUCAN) 200 MG tablet Take 1 tablet (200 mg total) by mouth daily.   lisinopril (ZESTRIL) 5 MG tablet Take 1 tablet (5 mg total) by mouth at bedtime.   Multiple Vitamin (MULTIVITAMIN WITH MINERALS) TABS tablet Take 1 tablet by mouth daily.   Probiotic Product (PROBIOTIC-10 PO) Take 1 capsule by mouth daily.    Objective: BP 133/71   Pulse 61   Ht 5\' 11"  (1.803 m)   Wt 160 lb (72.6 kg)   BMI 22.32 kg/m   Physical Exam:  General: Alert and oriented. and No acute distress. Gait: Normal gait.  Right shoulder without deformity.  Small bump at [facility] joint.  This is tender to palpation.  Pain with overhead motion.  Palpation of AC joint recreates his pain.  Sensation to right had intact.  2+ radial pulse.   IMAGING: I personally ordered and reviewed the following images  XR of the right shoulder demonstrates no acute injury.  Sequelae of previous surgery.  Some ossification of the inferior aspect of the [facility] joint.  Glenohumeral joint is  reduced.  No proximal humeral migration.  Holes from previous biceps tenodesis visible, no adverse features.   Impression: right shoulder XR with evidence of prior surgery, no acute injury  New Medications:  No orders of the defined types were placed in this encounter.     Mordecai Rasmussen, MD  07/16/2021 1:28 PM

## 2021-07-18 ENCOUNTER — Other Ambulatory Visit: Payer: Self-pay | Admitting: Internal Medicine

## 2021-07-18 ENCOUNTER — Encounter: Payer: Self-pay | Admitting: Internal Medicine

## 2021-07-18 DIAGNOSIS — B370 Candidal stomatitis: Secondary | ICD-10-CM

## 2021-07-24 ENCOUNTER — Other Ambulatory Visit: Payer: Self-pay

## 2021-07-24 ENCOUNTER — Encounter: Payer: Self-pay | Admitting: Internal Medicine

## 2021-07-24 ENCOUNTER — Ambulatory Visit (INDEPENDENT_AMBULATORY_CARE_PROVIDER_SITE_OTHER): Payer: 59 | Admitting: Internal Medicine

## 2021-07-24 ENCOUNTER — Telehealth: Payer: Self-pay

## 2021-07-24 DIAGNOSIS — K136 Irritative hyperplasia of oral mucosa: Secondary | ICD-10-CM | POA: Diagnosis not present

## 2021-07-24 NOTE — Progress Notes (Signed)
[facility] for Infectious Disease      Reason for Consult:  Burning in mouth   Referring Physician: Dr. Posey Pronto    Patient ID: Logan B[facility], male    DOB: 03/24/1981, 40 y.o.   MRN: [id number]  HPI:   Here for evaluation of burning in his mouth and concern for oral and esophageal candida.   I saw him earlier this year for a concern for parasitic infection and testing all negative for infection.  He has ongoing symptoms in his abdomen and some recent mucous stool.  He has noted some white bumps in the back of his throat recently and has been concerned with it being thrush.  He has been on several courses of fluconazole and voriconazole and they have not resolved.  Some itch and burning.  No issues with reflux symptomatically.  Does not feel he has post nasal drip.    Past Medical History:  Diagnosis Date   Abscessed tooth    Anxiety    C. difficile diarrhea    presumptive diagnosis   Depression    Hypertension    Trigger finger     Prior to Admission medications   Medication Sig Start Date End Date Taking? Authorizing Provider  acyclovir (ZOVIRAX) 800 MG tablet Take 1 tablet (800 mg total) by mouth 5 (five) times daily. Patient not taking: No sig reported 04/25/21   Fayrene Helper, MD  Ascorbic Acid (VITAMIN C PO) Take 1 tablet by mouth in the morning, at noon, and at bedtime.    [provider]  baclofen (LIORESAL) 10 MG tablet Take 1 tablet (10 mg total) by mouth 2 (two) times daily. 05/09/21   Lindell Spar, MD  fluconazole (DIFLUCAN) 200 MG tablet Take 1 tablet (200 mg total) by mouth daily. 07/09/21   Lindell Spar, MD  lidocaine (XYLOCAINE) 2 % solution Use as directed 15 mLs in the mouth or throat every 6 (six) hours as needed for mouth pain. Patient not taking: Reported on 07/16/2021 04/25/21   Fayrene Helper, MD  lisinopril (ZESTRIL) 5 MG tablet Take 1 tablet (5 mg total) by mouth at bedtime. 06/19/21   Lindell Spar, MD  metoprolol tartrate  (LOPRESSOR) 100 MG tablet Take 1 tablet (100 mg total) by mouth as directed. Patient not taking: Reported on 07/16/2021 05/01/21 07/30/21  Josue Hector, MD  Multiple Vitamin (MULTIVITAMIN WITH MINERALS) TABS tablet Take 1 tablet by mouth daily.    [provider]  Multiple Vitamins-Minerals (ADULT ONE DAILY GUMMIES PO) Take 2 tablets by mouth in the morning. Patient not taking: Reported on 07/16/2021    [provider]  nystatin (MYCOSTATIN) 100000 UNIT/ML suspension Take 5 mLs (500,000 Units total) by mouth 4 (four) times daily. Swish and swallow Patient not taking: Reported on 07/16/2021 04/18/21   Annitta Needs, NP  omeprazole (PRILOSEC) 20 MG capsule Take 20 mg by mouth in the morning. Patient not taking: Reported on 07/16/2021    [provider]  Probiotic Product (PROBIOTIC-10 PO) Take 1 capsule by mouth daily.    [provider]    No Active Allergies  Social History   Tobacco Use   Smoking status: Former    Packs/day: 0.00    Years: 0.00    Pack years: 0.00    Types: Cigarettes    Quit date: 11/03/2019    Years since quitting: 1.7   Smokeless tobacco: Never   Tobacco comments:    nicoten gum  Vaping Use   Vaping Use: Former   Quit date: 03/07/2019  Substance Use Topics   Alcohol use: No   Drug use: Not Currently    Family History  Problem Relation Age of Onset   Rheum arthritis Mother    Bladder Cancer Mother    Kidney disease Father    Bladder Cancer Maternal Grandfather    Irritable bowel syndrome Paternal Grandmother    Colon cancer Neg Hx    Celiac disease Neg Hx    Inflammatory bowel disease Neg Hx     Review of Systems  Constitutional: negative for weight loss Gastrointestinal: negative for reflux symptoms Integument/breast: negative for rash All other systems reviewed and are negative    Constitutional: in no apparent distress There were no vitals filed for this visit. EYES: anicteric ENMT: tongue, pharynx  examined, some small bumps on the pharynx and red streaking.  No cervical lymph node swelling Respiratory: normal respiratory effort Musculoskeletal: no edema Skin: no rash Neuro: non-focal  Labs: Lab Results  Component Value Date   WBC 5.4 02/11/2021   HGB 15.9 02/11/2021   HCT 47.9 02/11/2021   MCV 97.6 02/11/2021   PLT 243 02/11/2021    Lab Results  Component Value Date   CREATININE 1.11 04/16/2021   BUN 15 04/16/2021   NA 141 04/16/2021   K 4.5 04/16/2021   CL 98 04/16/2021   CO2 23 04/16/2021    Lab Results  Component Value Date   ALT 35 04/16/2021   AST 27 04/16/2021   ALKPHOS 58 04/16/2021   BILITOT 0.5 04/16/2021   INR 0.99 10/03/2010     Assessment: oropharyngeal discomfort.  I examined his tongue and oropharyngeal area thoroughly and bumps noted and not c/w thrush.  I discussed this is likely why it has not improved since it does not appear to be infection.  I advised against further antifungal treatments.    Plan: 1)  no further fluconazole  Follow up as needed

## 2021-07-24 NOTE — Telephone Encounter (Signed)
Lorriane Shire from [place] called to let our office know that they do not take [facility].

## 2021-07-26 ENCOUNTER — Encounter: Payer: Self-pay | Admitting: Internal Medicine

## 2021-08-01 ENCOUNTER — Other Ambulatory Visit: Payer: Self-pay | Admitting: *Deleted

## 2021-08-01 DIAGNOSIS — B370 Candidal stomatitis: Secondary | ICD-10-CM

## 2021-08-05 ENCOUNTER — Encounter: Payer: Self-pay | Admitting: Internal Medicine

## 2021-08-07 ENCOUNTER — Encounter: Payer: Self-pay | Admitting: Internal Medicine

## 2021-08-07 ENCOUNTER — Other Ambulatory Visit: Payer: Self-pay | Admitting: Internal Medicine

## 2021-08-07 DIAGNOSIS — B80 Enterobiasis: Secondary | ICD-10-CM

## 2021-08-07 MED ORDER — ALBENDAZOLE 200 MG PO TABS: ORAL_TABLET | ORAL | 0 refills | Status: DC

## 2021-08-08 LAB — FUNGUS CULTURE W/RFX RAPID ID: Fungal Culture W/Rfx: NEGATIVE

## 2021-08-12 ENCOUNTER — Encounter: Payer: Self-pay | Admitting: Internal Medicine

## 2021-08-13 ENCOUNTER — Ambulatory Visit: Payer: 59 | Admitting: Orthopedic Surgery

## 2021-08-26 ENCOUNTER — Emergency Department (HOSPITAL_COMMUNITY): Payer: 59

## 2021-08-26 ENCOUNTER — Emergency Department (HOSPITAL_COMMUNITY)
Admission: EM | Admit: 2021-08-26 | Discharge: 2021-08-26 | Disposition: A | Discharge status: Home / Self Care | Payer: 59 | Attending: Emergency Medicine | Admitting: Emergency Medicine

## 2021-08-26 ENCOUNTER — Other Ambulatory Visit: Payer: Self-pay

## 2021-08-26 DIAGNOSIS — Z20822 Contact with and (suspected) exposure to covid-19: Secondary | ICD-10-CM | POA: Diagnosis not present

## 2021-08-26 DIAGNOSIS — R079 Chest pain, unspecified: Secondary | ICD-10-CM | POA: Diagnosis not present

## 2021-08-26 DIAGNOSIS — J029 Acute pharyngitis, unspecified: Secondary | ICD-10-CM

## 2021-08-26 DIAGNOSIS — Z87891 Personal history of nicotine dependence: Secondary | ICD-10-CM | POA: Insufficient documentation

## 2021-08-26 DIAGNOSIS — Z79899 Other long term (current) drug therapy: Secondary | ICD-10-CM | POA: Insufficient documentation

## 2021-08-26 DIAGNOSIS — I10 Essential (primary) hypertension: Secondary | ICD-10-CM | POA: Insufficient documentation

## 2021-08-26 DIAGNOSIS — R1013 Epigastric pain: Secondary | ICD-10-CM | POA: Diagnosis not present

## 2021-08-26 LAB — RESP PANEL BY RT-PCR (FLU A&B, COVID) ARPGX2
Influenza A by PCR: NEGATIVE
Influenza B by PCR: NEGATIVE
SARS Coronavirus 2 by RT PCR: NEGATIVE

## 2021-08-26 LAB — CBC
HCT: 48.8 % (ref 39.0–52.0)
Hemoglobin: 16.3 g/dL (ref 13.0–17.0)
MCH: 31.7 pg (ref 26.0–34.0)
MCHC: 33.4 g/dL (ref 30.0–36.0)
MCV: 94.9 fL (ref 80.0–100.0)
Platelets: 266 10*3/uL (ref 150–400)
RBC: 5.14 MIL/uL (ref 4.22–5.81)
RDW: 12.6 % (ref 11.5–15.5)
WBC: 6.1 10*3/uL (ref 4.0–10.5)
nRBC: 0 % (ref 0.0–0.2)

## 2021-08-26 LAB — TROPONIN I (HIGH SENSITIVITY)
Troponin I (High Sensitivity): 3 ng/L (ref <18)
Troponin I (High Sensitivity): <2 ng/L (ref <18)

## 2021-08-26 LAB — BASIC METABOLIC PANEL
Anion gap: 9 (ref 5–15)
BUN: 16 mg/dL (ref 6–20)
CO2: 27 mmol/L (ref 22–32)
Calcium: 10.4 mg/dL — ABNORMAL HIGH (ref 8.9–10.3)
Chloride: 103 mmol/L (ref 98–111)
Creatinine, Ser: 0.97 mg/dL (ref 0.61–1.24)
GFR, Estimated: >60 mL/min (ref >60)
Glucose, Bld: 105 mg/dL — ABNORMAL HIGH (ref 70–99)
Potassium: 4.3 mmol/L (ref 3.5–5.1)
Sodium: 139 mmol/L (ref 135–145)

## 2021-08-26 LAB — GROUP A STREP BY PCR: Group A Strep by PCR: NOT DETECTED

## 2021-08-26 MED ORDER — VORICONAZOLE 200 MG PO TABS: 200.0000 mg | ORAL_TABLET | Freq: Two times a day (BID) | ORAL | 0 refills | Status: AC

## 2021-08-26 NOTE — Discharge Instructions (Signed)
Concerned that the symptoms you are having may be related to your stomach and Barrett's esophagus.  Continue taking your omeprazole.  Plan on following up with your GI doctor.

## 2021-08-26 NOTE — ED Triage Notes (Signed)
Pt here POV with c/o of chest pain radiating to left arm. Pt endorses cold like symptoms to include, body aches, headache, fatigue. Denies cough, fever X2 months. Increased last 2days.

## 2021-08-26 NOTE — ED Provider Notes (Signed)
[facility] Provider Note   CSN: [id number] Arrival date & time: 08/26/21  1149     History Chief Complaint  Patient presents with   Chest Pain    Logan Benton is a 40 y.o. male.  Patient is a 40 year old male with a history of hypertension, Barrett's esophagus on omeprazole who is presenting today with persistent issues with pain in the back of his throat, itching sensation going down his throat and a pain in the left side of his chest which will radiate into his left arm causing numbness and tingling.  Also reports symptoms of itching and burning in his upper abdomen that does not seem to significantly been changed with eating.  He has not noticed any weight loss but does report mucousy stools.  Patient states symptoms have been going on for some time.  More recently he has been seeing his PCP Dr. Posey Pronto and reports receiving fluconazole due to concern for thrush in the back of his throat.  This did not help and he was then placed on voriconazole which he took for a week and his symptoms had almost completely resolved and then they restarted again.  He feels general malaise, joint aches and pains as well as intermittent low-grade fevers and chills at night.  He has seen infectious disease doctor, who told him at this point it did not appear that he had any type of thrush and he had a throat culture done by ENT that was negative for fungal infection.  He reports when he saw them he had just completed the course of voriconazole.  He has been tested for HIV and had various blood work done.  He has seen 3 ENTs.  He has not been back to see his GI doctor that follows him yearly for his Barrett's esophagus.  Them started after having to take multiple antiparasitic medications because he got pinworm from his brother and he also had been on doxycycline.  He has been on no antibiotics for some time now.  The last time he had any antifungal's was over a month ago  and he reports he just is feeling worse and worse.  He denies any cough or congestion.  No vomiting.  No shortness of breath.  Nothing seems to make the chest pain worse its been coming and going now for months.  The history is provided by the patient and medical records.  Chest Pain     Past Medical History:  Diagnosis Date   Abscessed tooth    Anxiety    C. difficile diarrhea    presumptive diagnosis   Depression    Hypertension    Trigger finger     Patient Active Problem List   Diagnosis Date Noted   Irritation of oral cavity 07/24/2021   Acute pain of right shoulder 07/09/2021   Family history of renal failure 03/21/2021   Hyperthyroidism 03/21/2021   Insomnia 03/21/2021   Dyspepsia 02/27/2021   IBS (irritable bowel syndrome) 02/27/2021   Primary hypertension 02/26/2021   Oropharyngeal candidiasis 02/26/2021   Sciatica 02/26/2021   Barrett's esophagus 12/31/2020   Proctocolitis 06/13/2020   Gallbladder polyp 06/13/2020    Past Surgical History:  Procedure Laterality Date   BILATERAL CARPAL TUNNEL RELEASE     BIOPSY  06/29/2020   Procedure: BIOPSY;  Surgeon: Eloise Harman, DO;  [facility];  Service: Endoscopy;;   BIOPSY  09/03/2020   Procedure: BIOPSY;  Surgeon: Eloise Harman, DO;  [facility];  Service: Endoscopy;;   BIOPSY  03/08/2021   Procedure: BIOPSY;  Surgeon: Eloise Harman, DO;  [facility];  Service: Endoscopy;;   BIOPSY  05/10/2021   Procedure: BIOPSY;  Surgeon: Eloise Harman, DO;  [facility];  Service: Endoscopy;;   COLONOSCOPY WITH PROPOFOL N/A 06/29/2020   Dr. Abbey Chatters: 4 mm tubular adenoma removed, nonbleeding internal hemorrhoids.  Repeat colonoscopy in 7 years.   COLONOSCOPY WITH PROPOFOL N/A 05/10/2021   Procedure: COLONOSCOPY WITH PROPOFOL;  Surgeon: Eloise Harman, DO;  [facility];  Service: Endoscopy;  Laterality: N/A;  1:15pm   ESOPHAGOGASTRODUODENOSCOPY (EGD) WITH  PROPOFOL N/A 06/29/2020   Dr. Abbey Chatters: LA grade C esophagitis, gastritis with mild chronic gastritis on path, small bowel biopsy unremarkable.   ESOPHAGOGASTRODUODENOSCOPY (EGD) WITH PROPOFOL N/A 09/03/2020   Dr. Abbey Chatters: Esophageal mucosal changes suggestive of short segment Barrett's esophagus, biopsies consistent.  Next EGD in 3 years   ESOPHAGOGASTRODUODENOSCOPY (EGD) WITH PROPOFOL N/A 03/08/2021   Procedure: ESOPHAGOGASTRODUODENOSCOPY (EGD) WITH PROPOFOL;  Surgeon: Eloise Harman, DO;  [facility];  Service: Endoscopy;  Laterality: N/A;  1:00pm   POLYPECTOMY  06/29/2020   Procedure: POLYPECTOMY;  Surgeon: Eloise Harman, DO;  [facility];  Service: Endoscopy;;   SHOULDER SURGERY Right    x2   TRIGGER FINGER RELEASE Right 2016       Family History  Problem Relation Age of Onset   Rheum arthritis Mother    Bladder Cancer Mother    Kidney disease Father    Bladder Cancer Maternal Grandfather    Irritable bowel syndrome Paternal Grandmother    Colon cancer Neg Hx    Celiac disease Neg Hx    Inflammatory bowel disease Neg Hx     Social History   Tobacco Use   Smoking status: Former    Packs/day: 0.00    Years: 0.00    Pack years: 0.00    Types: Cigarettes    Quit date: 11/03/2019    Years since quitting: 1.8   Smokeless tobacco: Never   Tobacco comments:    nicoten gum  Vaping Use   Vaping Use: Former   Quit date: 03/07/2019  Substance Use Topics   Alcohol use: No   Drug use: Yes    Types: Marijuana    Home Medications Prior to Admission medications   Medication Sig Start Date End Date Taking? Authorizing Provider  albendazole (ALBENZA) 200 MG tablet Take 2 tablets today and 2 tablets after 2 weeks. 08/07/21   Lindell Spar, MD  Ascorbic Acid (VITAMIN C PO) Take 1 tablet by mouth in the morning, at noon, and at bedtime.    [provider]  baclofen (LIORESAL) 10 MG tablet Take 1 tablet (10 mg total) by mouth 2 (two) times daily.  05/09/21   Lindell Spar, MD  lidocaine (XYLOCAINE) 2 % solution Use as directed 15 mLs in the mouth or throat every 6 (six) hours as needed for mouth pain. Patient not taking: No sig reported 04/25/21   Fayrene Helper, MD  lisinopril (ZESTRIL) 5 MG tablet Take 1 tablet (5 mg total) by mouth at bedtime. 06/19/21   Lindell Spar, MD  milk thistle 175 MG tablet Take 175 mg by mouth daily.    [provider]  Multiple Vitamin (MULTIVITAMIN WITH MINERALS) TABS tablet Take 1 tablet by mouth daily.    [provider]  omeprazole (PRILOSEC) 20  MG capsule Take 20 mg by mouth in the morning.    [provider]  Probiotic Product (PROBIOTIC-10 PO) Take 1 capsule by mouth daily.    [provider]    Allergies    Patient has no known allergies.  Review of Systems   Review of Systems  Cardiovascular:  Positive for chest pain.  All other systems reviewed and are negative.  Physical Exam Updated Vital Signs BP (!) 140/96   Pulse 64   Temp 98.4 F (36.9 C) (Oral)   Resp 16   Ht 5\' 11"  (1.803 m)   Wt 68 kg   SpO2 100%   BMI 20.92 kg/m   Physical Exam Vitals and nursing note reviewed.  Constitutional:      General: He is not in acute distress.    Appearance: He is well-developed.  HENT:     Head: Normocephalic and atraumatic.     Right Ear: Tympanic membrane normal.     Left Ear: Tympanic membrane normal.     Mouth/Throat:     Mouth: Mucous membranes are moist.     Comments: Cobblestoning noted to the back of the throat but no exudates present.  Poor dentition Eyes:     Conjunctiva/sclera: Conjunctivae normal.     Pupils: Pupils are equal, round, and reactive to light.  Cardiovascular:     Rate and Rhythm: Normal rate and regular rhythm.     Pulses: Normal pulses.     Heart sounds: No murmur heard. Pulmonary:     Effort: Pulmonary effort is normal. No respiratory distress.     Breath sounds: Normal breath sounds. No wheezing or rales.   Chest:     Chest wall: No tenderness.  Abdominal:     General: There is no distension.     Palpations: Abdomen is soft.     Tenderness: There is no abdominal tenderness. There is no guarding or rebound.     Comments: Mild epigastric tenderness  Musculoskeletal:        General: No tenderness. Normal range of motion.     Cervical back: Normal range of motion and neck supple.     Right lower leg: No edema.     Left lower leg: No edema.  Skin:    General: Skin is warm and dry.     Findings: No erythema or rash.  Neurological:     Mental Status: He is alert and oriented to person, place, and time. Mental status is at baseline.  Psychiatric:        Mood and Affect: Mood normal.        Behavior: Behavior normal.    ED Results / Procedures / Treatments   Labs (all labs ordered are listed, but only abnormal results are displayed) Labs Reviewed  BASIC METABOLIC PANEL - Abnormal; Notable for the following components:      Result Value   Glucose, Bld 105 (*)    Calcium 10.4 (*)    All other components within normal limits  GROUP A STREP BY PCR  RESP PANEL BY RT-PCR (FLU A&B, COVID) [id number]  CBC  TROPONIN I (HIGH SENSITIVITY)  TROPONIN I (HIGH SENSITIVITY)    EKG EKG Interpretation  Date/Time:  Monday August 26 2021 12:05:43 EST Ventricular Rate:  69 PR Interval:  142 QRS Duration: 108 QT Interval:  386 QTC Calculation: 413 R Axis:   76 Text Interpretation: Normal sinus rhythm Incomplete right bundle branch block No significant change since last tracing Confirmed by Maryan Rued,  Trigger Frasier [phone]) on 08/26/2021 4:12:56 PM  Radiology DG Chest 2 View  Result Date: 08/26/2021 CLINICAL DATA:  Chest pain EXAM: CHEST - 2 VIEW COMPARISON:  12/28/2020 FINDINGS: The heart size and mediastinal contours are within normal limits. Both lungs are clear. No pleural effusion or pneumothorax. The visualized skeletal structures are unremarkable. IMPRESSION: No acute process in the chest.  Electronically Signed   By: Macy Mis M.D.   On: 08/26/2021 13:17    Procedures Procedures   Medications Ordered in ED Medications - No data to display  ED Course  I have reviewed the triage vital signs and the nursing notes.  Pertinent labs & imaging results that were available during my care of the patient were reviewed by me and considered in my medical decision making (see chart for details).    MDM Rules/Calculators/A&P                           Patient presenting today with persistent symptoms as described above.  Patient reports he just wants someone to find out what the problem is.  He said the only thing that has helped in the past was voriconazole which she has had over a month ago.  He had seen ENT x3 and he reports they all told him he was fine.  He did see infectious disease but reported he had just finished the medication prior to seeing them.  He has had negative throat cultures.  Today there is no exudative lesions in his mouth.  There is cobblestoning in the back of his throat but he denies any nasal discharge or drainage.  EKG shows incomplete bundle branch block which is unchanged from prior EKGs.  Troponins are within normal limits, CBC and BMP without acute findings.  CXR wnl.  Low suspicion for ACS, PE, pneumonia, diverticulitis, pancreatitis or hepatitis.  Concerned that patient's symptoms may be coming from a GI source given his known history of Barrett's esophagus and the complaint of issues in his abdomen and throat.  Patient did call his GI doctor and can be seen on January 4 but reports in the meantime he is feeling miserable.  He is taking omeprazole and using Tylenol but has not been using any NSAIDs.  He does not drink any alcohol or use drugs other than marijuana occasionally.  He has no abdominal pain at this time concerning for an acute abdominal process.  Requesting further antifungals as this is the only thing that has helped him in the past.  We will give  him a short course as this should be out of his system for approximately a month before he sees GI.  MDM   Amount and/or Complexity of Data Reviewed Clinical lab tests: ordered and reviewed Tests in the radiology section of CPT: ordered and reviewed Tests in the medicine section of CPT: ordered and reviewed Independent visualization of images, tracings, or specimens: yes    Final Clinical Impression(s) / ED Diagnoses Final diagnoses:  Nonspecific chest pain  Sore throat    Rx / DC Orders ED Discharge Orders          Ordered    voriconazole (VFEND) 200 MG tablet  2 times daily        08/26/21 1738             Blanchie Dessert, MD 08/26/21 1738

## 2021-08-26 NOTE — ED Provider Notes (Signed)
Emergency Medicine Provider Triage Evaluation Note  Logan Benton , a 40 y.o. male  was evaluated in triage.  Pt complains of chest pain state.  Started 2 or 3 days ago, it was initially intermittent but has become constant.  Feels sharp, radiates to his left arm and feels like tingling and numbness to the left arm.  No shortness of breath or nausea.  Also concerned about sore throat and body aches, denies any fever cough.  Review of Systems  Positive: Above Negative: Above  Physical Exam  BP (!) 156/95 (BP Location: Left Arm)   Pulse 67   Temp 98.7 F (37.1 C)   Resp 16   Ht 5\' 11"  (1.803 m)   Wt 68 kg   SpO2 100%   BMI 20.92 kg/m  Gen:   Awake, no distress   Resp:  Normal effort  MSK:   Moves extremities without difficulty  Other:  Radial pulse 2+  Medical Decision Making  Medically screening exam initiated at 12:21 PM.  Appropriate orders placed.  Logan Benton was informed that the remainder of the evaluation will be completed by another provider, this initial triage assessment does not replace that evaluation, and the importance of remaining in the ED until their evaluation is complete.  Chest pain work-up   Sherrill Raring, Hershal Coria 08/26/21 1222    Daleen Bo, MD 08/26/21 [phone]

## 2021-09-03 ENCOUNTER — Encounter: Payer: Self-pay | Admitting: Internal Medicine

## 2021-09-04 ENCOUNTER — Other Ambulatory Visit: Payer: Self-pay | Admitting: Internal Medicine

## 2021-09-04 DIAGNOSIS — B80 Enterobiasis: Secondary | ICD-10-CM

## 2021-09-04 MED ORDER — ALBENDAZOLE 200 MG PO TABS: ORAL_TABLET | ORAL | 0 refills | Status: DC

## 2021-09-09 ENCOUNTER — Encounter: Payer: Self-pay | Admitting: Internal Medicine

## 2021-09-10 ENCOUNTER — Ambulatory Visit (INDEPENDENT_AMBULATORY_CARE_PROVIDER_SITE_OTHER): Payer: 59 | Admitting: Internal Medicine

## 2021-09-10 ENCOUNTER — Encounter: Payer: Self-pay | Admitting: Internal Medicine

## 2021-09-10 ENCOUNTER — Other Ambulatory Visit: Payer: Self-pay

## 2021-09-10 VITALS — BP 142/82 | HR 84 | Resp 18 | Ht 71.0 in | Wt 160.0 lb

## 2021-09-10 DIAGNOSIS — K2270 Barrett's esophagus without dysplasia: Secondary | ICD-10-CM

## 2021-09-10 DIAGNOSIS — B370 Candidal stomatitis: Secondary | ICD-10-CM | POA: Diagnosis not present

## 2021-09-10 MED ORDER — FLUCONAZOLE 200 MG PO TABS: 200.0000 mg | ORAL_TABLET | Freq: Every day | ORAL | 0 refills | Status: DC

## 2021-09-10 NOTE — Patient Instructions (Signed)
Please take Fluconazole as prescribed.  Please perform warm water gargling for sore throat.

## 2021-09-10 NOTE — Progress Notes (Signed)
follo

## 2021-09-12 ENCOUNTER — Encounter: Payer: Self-pay | Admitting: Internal Medicine

## 2021-09-13 ENCOUNTER — Other Ambulatory Visit: Payer: Self-pay | Admitting: Internal Medicine

## 2021-09-13 DIAGNOSIS — J312 Chronic pharyngitis: Secondary | ICD-10-CM

## 2021-09-13 DIAGNOSIS — Z0182 Encounter for allergy testing: Secondary | ICD-10-CM

## 2021-09-13 DIAGNOSIS — A6000 Herpesviral infection of urogenital system, unspecified: Secondary | ICD-10-CM

## 2021-09-13 MED ORDER — VALACYCLOVIR HCL 1 G PO TABS: 1000.0000 mg | ORAL_TABLET | Freq: Two times a day (BID) | ORAL | 0 refills | Status: AC

## 2021-09-13 NOTE — Assessment & Plan Note (Signed)
Refill fluconazole for now Advised him that we will not refill fluconazole now onwards until confirmed by fungal culture EGD has been negative for candidal esophagitis

## 2021-09-13 NOTE — Assessment & Plan Note (Signed)
On Omeprazole F/u with GI Has had EGD in the past.

## 2021-09-13 NOTE — Progress Notes (Signed)
Acute Office Visit  Subjective:    Patient ID: Logan Benton, male    DOB: September 28, 1981, 40 y.o.   MRN: [id number]  Chief Complaint  Patient presents with   Follow-up    Follow up pt went to ER 2 weeks ago for stomach and throat issues and this is no better went 09-02-21    HPI Patient is in today for complaint of sore throat and chronic abdominal discomfort.  He was seen in ER about 2 weeks ago and was given fluconazole for oral candidiasis.  He has had multiple rounds of fluconazole and 1 course of voriconazole.  He has been seen by ID and GI and has been reassured multiple times about this.  He has seen 2 ENT specialists for chronic sore throat and did not suggest any further investigation.  He still feels he has esophageal candidiasis which has spread to his intestines.  He has also seen white mucus in stool chronically, but his stool culture has been negative so far.  He denies any fever, chills, dyspnea or wheezing currently.  He has had chronic, intermittent left-sided chest pain.  His cardiac CT was benign.     Past Medical History:  Diagnosis Date   Abscessed tooth    Anxiety    C. difficile diarrhea    presumptive diagnosis   Depression    Hypertension    Trigger finger     Past Surgical History:  Procedure Laterality Date   BILATERAL CARPAL TUNNEL RELEASE     BIOPSY  06/29/2020   Procedure: BIOPSY;  Surgeon: Eloise Harman, DO;  [facility];  Service: Endoscopy;;   BIOPSY  09/03/2020   Procedure: BIOPSY;  Surgeon: Eloise Harman, DO;  [facility];  Service: Endoscopy;;   BIOPSY  03/08/2021   Procedure: BIOPSY;  Surgeon: Eloise Harman, DO;  [facility];  Service: Endoscopy;;   BIOPSY  05/10/2021   Procedure: BIOPSY;  Surgeon: Eloise Harman, DO;  [facility];  Service: Endoscopy;;   COLONOSCOPY WITH PROPOFOL N/A 06/29/2020   Dr. Abbey Chatters: 4 mm tubular adenoma removed, nonbleeding internal hemorrhoids.  Repeat  colonoscopy in 7 years.   COLONOSCOPY WITH PROPOFOL N/A 05/10/2021   Procedure: COLONOSCOPY WITH PROPOFOL;  Surgeon: Eloise Harman, DO;  [facility];  Service: Endoscopy;  Laterality: N/A;  1:15pm   ESOPHAGOGASTRODUODENOSCOPY (EGD) WITH PROPOFOL N/A 06/29/2020   Dr. Abbey Chatters: LA grade C esophagitis, gastritis with mild chronic gastritis on path, small bowel biopsy unremarkable.   ESOPHAGOGASTRODUODENOSCOPY (EGD) WITH PROPOFOL N/A 09/03/2020   Dr. Abbey Chatters: Esophageal mucosal changes suggestive of short segment Barrett's esophagus, biopsies consistent.  Next EGD in 3 years   ESOPHAGOGASTRODUODENOSCOPY (EGD) WITH PROPOFOL N/A 03/08/2021   Procedure: ESOPHAGOGASTRODUODENOSCOPY (EGD) WITH PROPOFOL;  Surgeon: Eloise Harman, DO;  [facility];  Service: Endoscopy;  Laterality: N/A;  1:00pm   POLYPECTOMY  06/29/2020   Procedure: POLYPECTOMY;  Surgeon: Eloise Harman, DO;  [facility];  Service: Endoscopy;;   SHOULDER SURGERY Right    x2   TRIGGER FINGER RELEASE Right 2016    Family History  Problem Relation Age of Onset   Rheum arthritis Mother    Bladder Cancer Mother    Kidney disease Father    Bladder Cancer Maternal Grandfather    Irritable bowel syndrome Paternal Grandmother    Colon cancer Neg Hx    Celiac disease Neg Hx    Inflammatory  bowel disease Neg Hx     Social History   Socioeconomic History   Marital status: Divorced    Spouse name: Not on file   Number of children: Not on file   Years of education: Not on file   Highest education level: Not on file  Occupational History   Not on file  Tobacco Use   Smoking status: Former    Packs/day: 0.00    Years: 0.00    Pack years: 0.00    Types: Cigarettes    Quit date: 11/03/2019    Years since quitting: 1.8   Smokeless tobacco: Never   Tobacco comments:    nicoten gum  Vaping Use   Vaping Use: Former   Quit date: 03/07/2019  Substance and Sexual Activity   Alcohol use: No   Drug  use: Yes    Types: Marijuana   Sexual activity: Not Currently    Partners: Female  Other Topics Concern   Not on file  Social History Narrative   Not on file   Social Determinants of Health   Financial Resource Strain: Not on file  Food Insecurity: Not on file  Transportation Needs: Not on file  Physical Activity: Not on file  Stress: Not on file  Social Connections: Not on file  Intimate Partner Violence: Not on file    Outpatient Medications Prior to Visit  Medication Sig Dispense Refill   albendazole (ALBENZA) 200 MG tablet Take 2 tablets today and 2 tablets after 2 weeks. 4 tablet 0   Ascorbic Acid (VITAMIN C PO) Take 1 tablet by mouth in the morning, at noon, and at bedtime.     lisinopril (ZESTRIL) 5 MG tablet Take 1 tablet (5 mg total) by mouth at bedtime. 30 tablet 5   milk thistle 175 MG tablet Take 175 mg by mouth daily.     Multiple Vitamin (MULTIVITAMIN WITH MINERALS) TABS tablet Take 1 tablet by mouth daily.     omeprazole (PRILOSEC) 20 MG capsule Take 20 mg by mouth in the morning.     Probiotic Product (PROBIOTIC-10 PO) Take 1 capsule by mouth daily.     baclofen (LIORESAL) 10 MG tablet Take 1 tablet (10 mg total) by mouth 2 (two) times daily. 60 each 2   lidocaine (XYLOCAINE) 2 % solution Use as directed 15 mLs in the mouth or throat every 6 (six) hours as needed for mouth pain. (Patient not taking: No sig reported) 100 mL 0   No facility-administered medications prior to visit.    No Known Allergies  Review of Systems  Constitutional:  Negative for chills and fever.  HENT:  Positive for mouth sores. Negative for congestion and sore throat.   Eyes:  Negative for pain and discharge.  Respiratory:  Negative for cough and shortness of breath.   Cardiovascular:  Negative for chest pain and palpitations.  Gastrointestinal:  Positive for abdominal pain. Negative for constipation, diarrhea, nausea and vomiting.  Endocrine: Negative for polydipsia and polyuria.   Genitourinary:  Negative for dysuria and hematuria.  Musculoskeletal:  Negative for neck pain and neck stiffness.  Skin:  Negative for rash.  Neurological:  Negative for dizziness, weakness, numbness and headaches.  Psychiatric/Behavioral:  Negative for agitation and behavioral problems.       Objective:    Physical Exam Vitals reviewed.  Constitutional:      General: He is not in acute distress.    Appearance: He is not diaphoretic.  HENT:     Head: Normocephalic  and atraumatic.     Nose: Nose normal.     Mouth/Throat:     Pharynx: Oropharyngeal exudate present. No posterior oropharyngeal erythema.  Eyes:     General: No scleral icterus.    Extraocular Movements: Extraocular movements intact.  Cardiovascular:     Rate and Rhythm: Normal rate and regular rhythm.     Pulses: Normal pulses.     Heart sounds: Normal heart sounds. No murmur heard. Pulmonary:     Breath sounds: Normal breath sounds. No wheezing or rales.  Abdominal:     Palpations: Abdomen is soft.     Tenderness: There is no abdominal tenderness.  Musculoskeletal:     Cervical back: Neck supple. No tenderness.     Right lower leg: No edema.     Left lower leg: No edema.  Skin:    General: Skin is warm.     Findings: No rash.  Neurological:     General: No focal deficit present.     Mental Status: He is alert and oriented to person, place, and time.  Psychiatric:        Mood and Affect: Mood normal.        Behavior: Behavior normal.    BP (!) 142/82 (BP Location: Left Arm, Patient Position: Sitting, Cuff Size: Normal)   Pulse 84   Resp 18   Ht 5\' 11"  (1.803 m)   Wt 160 lb 0.6 oz (72.6 kg)   SpO2 99%   BMI 22.32 kg/m  Wt Readings from Last 3 Encounters:  09/10/21 160 lb 0.6 oz (72.6 kg)  08/26/21 150 lb (68 kg)  07/24/21 160 lb 9.6 oz (72.8 kg)        Assessment & Plan:   Problem List Items Addressed This Visit       Digestive   Barrett's esophagus    On Omeprazole F/u with GI Has  had EGD in the past.      Oropharyngeal candidiasis - Primary    Refill fluconazole for now Advised him that we will not refill fluconazole now onwards until confirmed by fungal culture EGD has been negative for candidal esophagitis      Relevant Medications   fluconazole (DIFLUCAN) 200 MG tablet   Addendum: Patient called about having palpitations after taking fluconazole.  He was advised to stop taking it and go to ER if he has persistent symptoms of palpitations or chest pain.  He expressed understanding.  He also complains of genital herpes and requesting Valtrex, prescription sent.  Meds ordered this encounter  Medications   fluconazole (DIFLUCAN) 200 MG tablet    Sig: Take 1 tablet (200 mg total) by mouth daily.    Dispense:  7 tablet    Refill:  0     Toua Stites Keith Rake, MD

## 2021-09-17 ENCOUNTER — Encounter: Payer: Self-pay | Admitting: Internal Medicine

## 2021-09-18 ENCOUNTER — Other Ambulatory Visit: Payer: Self-pay | Admitting: Internal Medicine

## 2021-09-18 ENCOUNTER — Encounter: Payer: Self-pay | Admitting: Internal Medicine

## 2021-09-18 DIAGNOSIS — R197 Diarrhea, unspecified: Secondary | ICD-10-CM

## 2021-09-19 ENCOUNTER — Other Ambulatory Visit: Payer: Self-pay

## 2021-09-19 ENCOUNTER — Ambulatory Visit: Payer: 59 | Admitting: *Deleted

## 2021-09-19 DIAGNOSIS — K136 Irritative hyperplasia of oral mucosa: Secondary | ICD-10-CM

## 2021-09-19 NOTE — Telephone Encounter (Signed)
noted 

## 2021-09-23 ENCOUNTER — Encounter: Payer: Self-pay | Admitting: Internal Medicine

## 2021-09-25 ENCOUNTER — Other Ambulatory Visit: Payer: Self-pay

## 2021-09-25 ENCOUNTER — Encounter: Payer: Self-pay | Admitting: Internal Medicine

## 2021-09-25 ENCOUNTER — Ambulatory Visit (INDEPENDENT_AMBULATORY_CARE_PROVIDER_SITE_OTHER): Payer: 59 | Admitting: Internal Medicine

## 2021-09-25 VITALS — BP 122/77 | HR 58 | Temp 97.3°F | Ht 71.0 in | Wt 154.6 lb

## 2021-09-25 DIAGNOSIS — L290 Pruritus ani: Secondary | ICD-10-CM

## 2021-09-25 DIAGNOSIS — R1012 Left upper quadrant pain: Secondary | ICD-10-CM

## 2021-09-25 DIAGNOSIS — R197 Diarrhea, unspecified: Secondary | ICD-10-CM | POA: Diagnosis not present

## 2021-09-25 NOTE — Patient Instructions (Addendum)
I will order tape test to be performed at [facility] to rule out pinworms.  We will also check ova and parasites as well.    Continue on omeprazole 20 mg daily for your chronic reflux.  Continue on probiotics.  I would avoid antifungals going forward.  I hope you have a Merry Christmas.  Dr. Abbey Chatters   At [facility] we value your feedback. You may receive a survey about your visit today. Please share your experience as we strive to create trusting relationships with our patients to provide genuine, compassionate, quality care.  We appreciate your understanding and patience as we review any laboratory studies, imaging, and other diagnostic tests that are ordered as we care for you. Our office policy is 5 business days for review of these results, and any emergent or urgent results are addressed in a timely manner for your best interest. If you do not hear from our office in 1 week, please contact us.   We also encourage the use of MyChart, which contains your medical information for your review as well. If you are not enrolled in this feature, an access code is on this after visit summary for your convenience. Thank you for allowing [place] to be involved in your care.  It was great to see you today!  I hope you have a great rest of your Winter!    Logan Benton. Abbey Chatters, D.O. Gastroenterology and Hepatology [facility]

## 2021-09-25 NOTE — Progress Notes (Signed)
Referring Provider: Lindell Spar, MD Primary Care Physician:  Lindell Spar, MD Primary GI:  Dr. Abbey Chatters  Chief Complaint  Patient presents with   Gastroesophageal Reflux    Doing fine   pin worms    Reports brother was homeless and took him into home and this is how he got pinworms   Diarrhea    Saturday was last episode. Wants a "tape stool test" through [facility] and a parasite stool test as well    HPI:   Logan Benton is a 40 y.o. male who presents to clinic today for urgent visit.  He is concerned about parasitic infection.  States that his homeless brother has moved in with him and he believes he is contracted parasite.  Notes seeing worms in his stool.  Also with worsening diarrhea and rectal itching.  Has had multiple negative O&P testing in the past.  Has seen ID and has had also additional testing.    Has had numerous EGD and colonoscopies.  Most recent colonoscopy 05/10/2021 unremarkable, small bowel and colon biopsies negative.  Most recent EGD 03/08/2021 with negative esophageal and gastric biopsies.  Cytology of esophagus negative for yeast.  Last seen by infectious disease 07/24/2021 who recommended no further antifungals.  CT July 2021 with possible mild proctocolitis. Abd xray March 2022 with moderate stool in colon. Lipase normal, CMP normal, no anemia on CBC  Past Medical History:  Diagnosis Date   Abscessed tooth    Anxiety    C. difficile diarrhea    presumptive diagnosis   Depression    Hypertension    Trigger finger     Past Surgical History:  Procedure Laterality Date   BILATERAL CARPAL TUNNEL RELEASE     BIOPSY  06/29/2020   Procedure: BIOPSY;  Surgeon: Eloise Harman, DO;  [facility];  Service: Endoscopy;;   BIOPSY  09/03/2020   Procedure: BIOPSY;  Surgeon: Eloise Harman, DO;  [facility];  Service: Endoscopy;;   BIOPSY  03/08/2021   Procedure: BIOPSY;  Surgeon: Eloise Harman, DO;  [facility];   Service: Endoscopy;;   BIOPSY  05/10/2021   Procedure: BIOPSY;  Surgeon: Eloise Harman, DO;  [facility];  Service: Endoscopy;;   COLONOSCOPY WITH PROPOFOL N/A 06/29/2020   Dr. Abbey Chatters: 4 mm tubular adenoma removed, nonbleeding internal hemorrhoids.  Repeat colonoscopy in 7 years.   COLONOSCOPY WITH PROPOFOL N/A 05/10/2021   Procedure: COLONOSCOPY WITH PROPOFOL;  Surgeon: Eloise Harman, DO;  [facility];  Service: Endoscopy;  Laterality: N/A;  1:15pm   ESOPHAGOGASTRODUODENOSCOPY (EGD) WITH PROPOFOL N/A 06/29/2020   Dr. Abbey Chatters: LA grade C esophagitis, gastritis with mild chronic gastritis on path, small bowel biopsy unremarkable.   ESOPHAGOGASTRODUODENOSCOPY (EGD) WITH PROPOFOL N/A 09/03/2020   Dr. Abbey Chatters: Esophageal mucosal changes suggestive of short segment Barrett's esophagus, biopsies consistent.  Next EGD in 3 years   ESOPHAGOGASTRODUODENOSCOPY (EGD) WITH PROPOFOL N/A 03/08/2021   Procedure: ESOPHAGOGASTRODUODENOSCOPY (EGD) WITH PROPOFOL;  Surgeon: Eloise Harman, DO;  [facility];  Service: Endoscopy;  Laterality: N/A;  1:00pm   POLYPECTOMY  06/29/2020   Procedure: POLYPECTOMY;  Surgeon: Eloise Harman, DO;  [facility];  Service: Endoscopy;;   SHOULDER SURGERY Right    x2   TRIGGER FINGER RELEASE Right 2016    Current Outpatient Medications  Medication Sig Dispense Refill   Ascorbic Acid (VITAMIN C PO) Take 1 tablet by  mouth in the morning, at noon, and at bedtime.     lisinopril (ZESTRIL) 5 MG tablet Take 1 tablet (5 mg total) by mouth at bedtime. 30 tablet 5   Multiple Vitamin (MULTIVITAMIN WITH MINERALS) TABS tablet Take 1 tablet by mouth daily.     omeprazole (PRILOSEC) 20 MG capsule Take 20 mg by mouth in the morning.     Probiotic Product (PROBIOTIC-10 PO) Take 1 capsule by mouth daily.     No current facility-administered medications for this visit.    Allergies as of 09/25/2021   (No Known Allergies)    Family  History  Problem Relation Age of Onset   Rheum arthritis Mother    Bladder Cancer Mother    Kidney disease Father    Bladder Cancer Maternal Grandfather    Irritable bowel syndrome Paternal Grandmother    Colon cancer Neg Hx    Celiac disease Neg Hx    Inflammatory bowel disease Neg Hx     Social History   Socioeconomic History   Marital status: Divorced    Spouse name: Not on file   Number of children: Not on file   Years of education: Not on file   Highest education level: Not on file  Occupational History   Not on file  Tobacco Use   Smoking status: Former    Packs/day: 0.00    Years: 0.00    Pack years: 0.00    Types: Cigarettes    Quit date: 11/03/2019    Years since quitting: 1.8   Smokeless tobacco: Never   Tobacco comments:    nicoten gum  Vaping Use   Vaping Use: Former   Quit date: 03/07/2019  Substance and Sexual Activity   Alcohol use: No   Drug use: Yes    Types: Marijuana   Sexual activity: Not Currently    Partners: Female  Other Topics Concern   Not on file  Social History Narrative   Not on file   Social Determinants of Health   Financial Resource Strain: Not on file  Food Insecurity: Not on file  Transportation Needs: Not on file  Physical Activity: Not on file  Stress: Not on file  Social Connections: Not on file    Subjective: Review of Systems  Constitutional:  Negative for chills and fever.  HENT:  Negative for congestion and hearing loss.   Eyes:  Negative for blurred vision and double vision.  Respiratory:  Negative for cough and shortness of breath.   Cardiovascular:  Negative for chest pain and palpitations.  Gastrointestinal:  Positive for abdominal pain and diarrhea. Negative for blood in stool, constipation, heartburn, melena and vomiting.       Rectal itching  Genitourinary:  Negative for dysuria and urgency.  Musculoskeletal:  Negative for joint pain and myalgias.  Skin:  Negative for itching and rash.  Neurological:   Negative for dizziness and headaches.  Psychiatric/Behavioral:  Negative for depression. The patient is not nervous/anxious.     Objective: BP 122/77    Pulse (!) 58    Temp (!) 97.3 F (36.3 C)    Ht 5\' 11"  (1.803 m)    Wt 154 lb 9.6 oz (70.1 kg)    BMI 21.56 kg/m  Physical Exam Constitutional:      Appearance: Normal appearance.  HENT:     Head: Normocephalic and atraumatic.  Eyes:     Extraocular Movements: Extraocular movements intact.     Conjunctiva/sclera: Conjunctivae normal.  Cardiovascular:  Rate and Rhythm: Normal rate and regular rhythm.  Pulmonary:     Effort: Pulmonary effort is normal.     Breath sounds: Normal breath sounds.  Abdominal:     General: Bowel sounds are normal.     Palpations: Abdomen is soft.  Musculoskeletal:        General: Normal range of motion.     Cervical back: Normal range of motion and neck supple.  Skin:    General: Skin is warm.  Neurological:     General: No focal deficit present.     Mental Status: He is alert and oriented to person, place, and time.  Psychiatric:        Mood and Affect: Mood normal.        Behavior: Behavior normal.     Assessment: *Diarrhea *Rectal itching *GERD  Plan: Discussed patient's symptoms in depth today.  He is concerned about reinfection with possible parasite.  We will order O&P to be performed at [facility].  We will also order tape test given his rectal itching to rule out pinworm.  Counseled patient to not take any antifungals going forward  Continue on omeprazole daily for chronic reflux which is well controlled.  09/25/2021 9:08 AM   Disclaimer: This note was dictated with voice recognition software. Similar sounding words can inadvertently be transcribed and may not be corrected upon review.

## 2021-09-28 LAB — STOOL CULTURE: E coli, Shiga toxin Assay: NEGATIVE

## 2021-10-04 ENCOUNTER — Other Ambulatory Visit: Payer: Self-pay | Admitting: Internal Medicine

## 2021-10-04 NOTE — Telephone Encounter (Signed)
Checked the pt 's labs and his test ordered Dec. 21st has not been done. (Pinworm prep and Ova & parasite examination).

## 2021-10-07 ENCOUNTER — Other Ambulatory Visit: Payer: Self-pay | Admitting: Internal Medicine

## 2021-10-07 LAB — PINWORM PREP
MICRO NUMBER:: 12801203
RESULT:: NONE SEEN
SPECIMEN QUALITY:: ADEQUATE

## 2021-10-07 MED ORDER — OMEPRAZOLE 20 MG PO CPDR: 20.0000 mg | DELAYED_RELEASE_CAPSULE | Freq: Every morning | ORAL | 0 refills | Status: DC

## 2021-10-10 DIAGNOSIS — R1012 Left upper quadrant pain: Secondary | ICD-10-CM | POA: Diagnosis not present

## 2021-10-10 DIAGNOSIS — R197 Diarrhea, unspecified: Secondary | ICD-10-CM | POA: Diagnosis not present

## 2021-10-10 DIAGNOSIS — L290 Pruritus ani: Secondary | ICD-10-CM | POA: Diagnosis not present

## 2021-10-14 NOTE — Telephone Encounter (Signed)
Hey Dr. Abbey Chatters, As you can see Logan Benton is sending messages to quite a few people regarding Pinworms. Please contact this pt regarding this issue. If he is actually seeing pinworms now, this would be the perfect time for a test. Please advise

## 2021-10-14 NOTE — Telephone Encounter (Signed)
See other pt advise note

## 2021-10-14 NOTE — Telephone Encounter (Signed)
See other pt advise note that has already been sent to Dr. Abbey Chatters several times

## 2021-10-15 LAB — OVA AND PARASITE EXAMINATION
CONCENTRATE RESULT:: NONE SEEN
MICRO NUMBER:: 12832519
SPECIMEN QUALITY:: ADEQUATE
TRICHROME RESULT:: NONE SEEN

## 2021-10-15 NOTE — Telephone Encounter (Signed)
Notified Dr. Abbey Chatters of this message.

## 2021-10-16 ENCOUNTER — Telehealth: Payer: Self-pay | Admitting: Internal Medicine

## 2021-10-16 ENCOUNTER — Ambulatory Visit: Payer: 59 | Admitting: Internal Medicine

## 2021-10-16 NOTE — Telephone Encounter (Signed)
I called and spoke with patient.  His repeat O&P, tape test, were both negative.  He believes he did not do the tape test during the right time as he has been consistently taking Pyrantel.  He notes rectal itching for the last few days and notes he actually saw a "moving worm" on perianal exam in the mirror.  He is inquiring about a repeat tape test today.  I will reorder tape test to be performed at [facility].  Patient to only have this performed if he notes similar findings.  I have recommended that he stop taking all parasitic medications as well as any antifungal's.  He is in agreement.  He states he will stop these going forward.  Continue on PPI for chronic reflux which is well controlled.  Patient to follow up in February as previously scheduled

## 2021-10-16 NOTE — Telephone Encounter (Signed)
Pt was seen by Dr Abbey Chatters on 12/21 and called today to make another OV with Dr Abbey Chatters. He said he is still having the same problems, nothing is getting better. He said he sent a mychart message on Friday and no one has gotten back with him about his "intestinal issues". Please call [phone]

## 2021-10-17 ENCOUNTER — Telehealth: Payer: Self-pay

## 2021-10-17 DIAGNOSIS — Z8709 Personal history of other diseases of the respiratory system: Secondary | ICD-10-CM

## 2021-10-17 LAB — FUNGUS CULTURE W/RFX RAPID ID: Fungal Culture W/Rfx: NEGATIVE

## 2021-10-17 NOTE — Telephone Encounter (Signed)
Per Dr. Abbey Chatters please refer the pt ENT doctor. I'm contacting the pt now and will let you know if he is agreeable to it or not.

## 2021-10-17 NOTE — Telephone Encounter (Signed)
Good Morning, Wasn't this pt referred to infectious disease at one point.? His last labs are back and he has seen them but they need to be signed off on. Maybe a ENT referral? Please advise

## 2021-10-17 NOTE — Telephone Encounter (Signed)
noted 

## 2021-10-17 NOTE — Telephone Encounter (Signed)
Referral sent to Dr. Benjamine Mola ENT.

## 2021-10-17 NOTE — Addendum Note (Signed)
Addended by: Cheron Every on: 10/17/2021 02:28 PM   Modules accepted: Orders

## 2021-10-23 ENCOUNTER — Ambulatory Visit: Payer: 59 | Admitting: Internal Medicine

## 2021-10-29 ENCOUNTER — Other Ambulatory Visit: Payer: Self-pay | Admitting: Internal Medicine

## 2021-10-29 MED ORDER — OMEPRAZOLE 20 MG PO CPDR: 20.0000 mg | DELAYED_RELEASE_CAPSULE | Freq: Every morning | ORAL | 0 refills | Status: DC

## 2021-10-31 ENCOUNTER — Telehealth: Payer: Self-pay | Admitting: Internal Medicine

## 2021-10-31 ENCOUNTER — Encounter: Payer: Self-pay | Admitting: Internal Medicine

## 2021-10-31 DIAGNOSIS — L290 Pruritus ani: Secondary | ICD-10-CM

## 2021-10-31 NOTE — Telephone Encounter (Signed)
Dr. Abbey Chatters placed lab form for the pt

## 2021-10-31 NOTE — Telephone Encounter (Signed)
FYI:  Dr. Abbey Chatters,  Call from [facility] this morning stating that the pt showed up over there wanting a PIN WORM test and I advised Benjamine Mola that we didn't send the pt over for that in fact you had not answered his message regarding it. Advised her unless he has paper in hand or in the system "do not do test for the pt"

## 2021-10-31 NOTE — Telephone Encounter (Signed)
Test ordered

## 2021-11-11 DIAGNOSIS — L290 Pruritus ani: Secondary | ICD-10-CM | POA: Diagnosis not present

## 2021-11-11 NOTE — Telephone Encounter (Signed)
Noted-- waiting on response from Dr Abbey Chatters

## 2021-11-13 LAB — PINWORM PREP
MICRO NUMBER:: 12969259
RESULT:: NONE SEEN
SPECIMEN QUALITY:: ADEQUATE

## 2021-11-13 NOTE — Telephone Encounter (Signed)
Dr. Abbey Chatters is aware of this chart note

## 2021-11-15 ENCOUNTER — Other Ambulatory Visit: Payer: Self-pay | Admitting: Internal Medicine

## 2021-11-15 ENCOUNTER — Encounter: Payer: Self-pay | Admitting: Internal Medicine

## 2021-11-15 DIAGNOSIS — A6000 Herpesviral infection of urogenital system, unspecified: Secondary | ICD-10-CM

## 2021-11-15 MED ORDER — VALACYCLOVIR HCL 1 G PO TABS: 1000.0000 mg | ORAL_TABLET | Freq: Two times a day (BID) | ORAL | 0 refills | Status: AC

## 2021-11-20 ENCOUNTER — Ambulatory Visit: Payer: Self-pay | Admitting: Internal Medicine

## 2021-11-28 ENCOUNTER — Other Ambulatory Visit: Payer: Self-pay | Admitting: Internal Medicine

## 2021-11-28 MED ORDER — OMEPRAZOLE 20 MG PO CPDR: 20.0000 mg | DELAYED_RELEASE_CAPSULE | Freq: Every morning | ORAL | 0 refills | Status: DC

## 2021-12-12 NOTE — Telephone Encounter (Signed)
I have informed Dr. Abbey Chatters of this mychart message. ? ?

## 2021-12-18 ENCOUNTER — Ambulatory Visit: Payer: 59 | Admitting: Family Medicine

## 2021-12-20 ENCOUNTER — Other Ambulatory Visit: Payer: Self-pay

## 2021-12-20 ENCOUNTER — Ambulatory Visit
Admission: EM | Admit: 2021-12-20 | Discharge: 2021-12-20 | Disposition: A | Discharge status: Home / Self Care | Payer: 59 | Attending: Urgent Care | Admitting: Urgent Care

## 2021-12-20 DIAGNOSIS — B370 Candidal stomatitis: Secondary | ICD-10-CM

## 2021-12-20 MED ORDER — FLUCONAZOLE 200 MG PO TABS: 200.0000 mg | ORAL_TABLET | Freq: Every day | ORAL | 0 refills | Status: AC

## 2021-12-20 NOTE — ED Provider Notes (Signed)
?Logan Benton ? ? ?MRN: [id number] DOB: 19-Oct-1980 ? ?Subjective:  ? ?Logan Benton is a 41 y.o. male presenting for recurrent upset GI system, burning sensation in his throat and change in stool.  Recently had exposure to pinworms as he took his brother back in.  He was homeless and ended up having a severe outbreak of the pinworms in his home.  He is not concerned for this.  Has not seen any pinworms in his stool.  Does not have any perianal itching.  He did undergo treatment with albendazole and completed this about 6 weeks ago.  No throat pain, vomiting, diarrhea.  Has an extensive history of recurrent oropharyngeal, intestinal candidiasis.  Usually responds very well to oral fluconazole.  Would like a 2-week course.  He does have a history of Barrett's esophagus, dyspepsia, IBS, C. difficile.  He is actually going to see a gastroenterologist soon.  Has a PCP but is in the process of establishing care with a new one. ? ?No current facility-administered medications for this encounter. ? ?Current Outpatient Medications:  ?  Ascorbic Acid (VITAMIN C PO), Take 1 tablet by mouth in the morning, at noon, and at bedtime., Disp: , Rfl:  ?  lisinopril (ZESTRIL) 5 MG tablet, Take 1 tablet (5 mg total) by mouth at bedtime., Disp: 30 tablet, Rfl: 5 ?  Multiple Vitamin (MULTIVITAMIN WITH MINERALS) TABS tablet, Take 1 tablet by mouth daily., Disp: , Rfl:  ?  omeprazole (PRILOSEC) 20 MG capsule, Take 1 capsule (20 mg total) by mouth every morning., Disp: 30 capsule, Rfl: 0 ?  Probiotic Product (PROBIOTIC-10 PO), Take 1 capsule by mouth daily., Disp: , Rfl:   ? ?No Known Allergies ? ?Past Medical History:  ?Diagnosis Date  ? Abscessed tooth   ? Anxiety   ? C. difficile diarrhea   ? presumptive diagnosis  ? Depression   ? Hypertension   ? Trigger finger   ?  ? ?Past Surgical History:  ?Procedure Laterality Date  ? BILATERAL CARPAL TUNNEL RELEASE    ? BIOPSY  06/29/2020  ? Procedure: BIOPSY;  Surgeon: Eloise Harman, DO;  [facility];  Service: Endoscopy;;  ? BIOPSY  09/03/2020  ? Procedure: BIOPSY;  Surgeon: Eloise Harman, DO;  [facility];  Service: Endoscopy;;  ? BIOPSY  03/08/2021  ? Procedure: BIOPSY;  Surgeon: Eloise Harman, DO;  [facility];  Service: Endoscopy;;  ? BIOPSY  05/10/2021  ? Procedure: BIOPSY;  Surgeon: Eloise Harman, DO;  [facility];  Service: Endoscopy;;  ? COLONOSCOPY WITH PROPOFOL N/A 06/29/2020  ? Dr. Abbey Chatters: 4 mm tubular adenoma removed, nonbleeding internal hemorrhoids.  Repeat colonoscopy in 7 years.  ? COLONOSCOPY WITH PROPOFOL N/A 05/10/2021  ? Procedure: COLONOSCOPY WITH PROPOFOL;  Surgeon: Eloise Harman, DO;  [facility];  Service: Endoscopy;  Laterality: N/A;  1:15pm  ? ESOPHAGOGASTRODUODENOSCOPY (EGD) WITH PROPOFOL N/A 06/29/2020  ? Dr. Abbey Chatters: LA grade C esophagitis, gastritis with mild chronic gastritis on path, small bowel biopsy unremarkable.  ? ESOPHAGOGASTRODUODENOSCOPY (EGD) WITH PROPOFOL N/A 09/03/2020  ? Dr. Abbey Chatters: Esophageal mucosal changes suggestive of short segment Barrett's esophagus, biopsies consistent.  Next EGD in 3 years  ? ESOPHAGOGASTRODUODENOSCOPY (EGD) WITH PROPOFOL N/A 03/08/2021  ? Procedure: ESOPHAGOGASTRODUODENOSCOPY (EGD) WITH PROPOFOL;  Surgeon: Eloise Harman, DO;  [facility];  Service: Endoscopy;  Laterality: N/A;  1:00pm  ? POLYPECTOMY  06/29/2020  ? Procedure: POLYPECTOMY;  Surgeon: Eloise Harman, DO;  [facility];  Service: Endoscopy;;  ? SHOULDER SURGERY Right   ? x2  ? TRIGGER FINGER RELEASE Right 2016  ? ? ?Family History  ?Problem Relation Age of Onset  ? Rheum arthritis Mother   ? Bladder Cancer Mother   ? Kidney disease Father   ? Bladder Cancer Maternal Grandfather   ? Irritable bowel syndrome Paternal Grandmother   ? Colon cancer Neg Hx   ? Celiac disease Neg Hx   ? Inflammatory bowel disease Neg Hx   ? ? ?Social History  ? ?Tobacco Use  ?  Smoking status: Former  ?  Packs/day: 0.00  ?  Years: 0.00  ?  Pack years: 0.00  ?  Types: Cigarettes  ?  Quit date: 11/03/2019  ?  Years since quitting: 2.1  ? Smokeless tobacco: Never  ? Tobacco comments:  ?  nicoten gum  ?Vaping Use  ? Vaping Use: Former  ? Quit date: 03/07/2019  ?Substance Use Topics  ? Alcohol use: No  ? Drug use: Yes  ?  Types: Marijuana  ? ? ?ROS ? ? ?Objective:  ? ?Vitals: ?BP (!) 151/81 (BP Location: Right Arm)   Pulse 65   Temp 98.6 ?F (37 ?C) (Oral)   Resp 16   SpO2 98%  ? ?Physical Exam ?Constitutional:   ?   General: He is not in acute distress. ?   Appearance: Normal appearance. He is well-developed and normal weight. He is not ill-appearing, toxic-appearing or diaphoretic.  ?HENT:  ?   Head: Normocephalic and atraumatic.  ?   Right Ear: External ear normal.  ?   Left Ear: External ear normal.  ?   Nose: Nose normal.  ?   Mouth/Throat:  ?   Pharynx: Oropharynx is clear. Posterior oropharyngeal erythema present. No oropharyngeal exudate.  ?Eyes:  ?   General: No scleral icterus.    ?   Right eye: No discharge.     ?   Left eye: No discharge.  ?   Extraocular Movements: Extraocular movements intact.  ?Cardiovascular:  ?   Rate and Rhythm: Normal rate.  ?Pulmonary:  ?   Effort: Pulmonary effort is normal.  ?Abdominal:  ?   General: Bowel sounds are normal. There is no distension.  ?   Palpations: Abdomen is soft. There is no mass.  ?   Tenderness: There is no abdominal tenderness. There is no right CVA tenderness, left CVA tenderness, guarding or rebound.  ?Musculoskeletal:  ?   Cervical back: Normal range of motion.  ?Neurological:  ?   Mental Status: He is alert and oriented to person, place, and time.  ?Psychiatric:     ?   Mood and Affect: Mood normal.     ?   Behavior: Behavior normal.     ?   Thought Content: Thought content normal.     ?   Judgment: Judgment normal.  ? ? ?Assessment and Plan :  ? ?PDMP not reviewed this encounter. ? ?1. Oropharyngeal candidiasis   ? ?I was  agreeable to helping patient with a medication refill for his oropharyngeal on intestinal candidiasis.  Advised that we need to limit this to 2 weeks.  Emphasized need for follow-up with his gastroenterologist.  He does have a history of marijuana use and this is absolutely a possibility source of his GI upset.  However the plan is for him to continue with his follow-up with a GI specialist. Counseled patient  on potential for adverse effects with medications prescribed/recommended today, ER and return-to-clinic precautions discussed, patient verbalized understanding. ? ?  ?Jaynee Eagles, PA-C ?12/20/21 1816 ? ?

## 2021-12-20 NOTE — ED Triage Notes (Signed)
Pt presents for fluconazole refill.  ?

## 2022-01-01 ENCOUNTER — Other Ambulatory Visit: Payer: Self-pay | Admitting: Internal Medicine

## 2022-01-01 MED ORDER — OMEPRAZOLE 20 MG PO CPDR: 20.0000 mg | DELAYED_RELEASE_CAPSULE | Freq: Every morning | ORAL | 0 refills | Status: DC

## 2022-01-03 ENCOUNTER — Ambulatory Visit (HOSPITAL_COMMUNITY): Admission: RE | Admit: 2022-01-03 | Payer: 59 | Source: Ambulatory Visit

## 2022-01-03 ENCOUNTER — Encounter (HOSPITAL_COMMUNITY): Payer: Self-pay

## 2022-01-03 ENCOUNTER — Other Ambulatory Visit: Payer: Self-pay

## 2022-01-03 ENCOUNTER — Emergency Department (HOSPITAL_COMMUNITY): Payer: 59

## 2022-01-03 ENCOUNTER — Emergency Department (HOSPITAL_COMMUNITY)
Admission: EM | Admit: 2022-01-03 | Discharge: 2022-01-03 | Disposition: A | Discharge status: Home / Self Care | Payer: 59 | Attending: Emergency Medicine | Admitting: Emergency Medicine

## 2022-01-03 ENCOUNTER — Other Ambulatory Visit (HOSPITAL_COMMUNITY): Payer: Self-pay

## 2022-01-03 DIAGNOSIS — R1084 Generalized abdominal pain: Secondary | ICD-10-CM | POA: Insufficient documentation

## 2022-01-03 DIAGNOSIS — R197 Diarrhea, unspecified: Secondary | ICD-10-CM | POA: Diagnosis not present

## 2022-01-03 DIAGNOSIS — R109 Unspecified abdominal pain: Secondary | ICD-10-CM | POA: Diagnosis not present

## 2022-01-03 LAB — CBC
HCT: 43.5 % (ref 39.0–52.0)
Hemoglobin: 14.7 g/dL (ref 13.0–17.0)
MCH: 32.5 pg (ref 26.0–34.0)
MCHC: 33.8 g/dL (ref 30.0–36.0)
MCV: 96 fL (ref 80.0–100.0)
Platelets: 245 10*3/uL (ref 150–400)
RBC: 4.53 MIL/uL (ref 4.22–5.81)
RDW: 12.9 % (ref 11.5–15.5)
WBC: 6.8 10*3/uL (ref 4.0–10.5)
nRBC: 0 % (ref 0.0–0.2)

## 2022-01-03 LAB — COMPREHENSIVE METABOLIC PANEL
ALT: 30 U/L (ref 0–44)
AST: 29 U/L (ref 15–41)
Albumin: 4.4 g/dL (ref 3.5–5.0)
Alkaline Phosphatase: 39 U/L (ref 38–126)
Anion gap: 9 (ref 5–15)
BUN: 15 mg/dL (ref 6–20)
CO2: 24 mmol/L (ref 22–32)
Calcium: 9.2 mg/dL (ref 8.9–10.3)
Chloride: 103 mmol/L (ref 98–111)
Creatinine, Ser: 0.92 mg/dL (ref 0.61–1.24)
GFR, Estimated: >60 mL/min (ref >60)
Glucose, Bld: 87 mg/dL (ref 70–99)
Potassium: 3.7 mmol/L (ref 3.5–5.1)
Sodium: 136 mmol/L (ref 135–145)
Total Bilirubin: 1.1 mg/dL (ref 0.3–1.2)
Total Protein: 7.2 g/dL (ref 6.5–8.1)

## 2022-01-03 LAB — URINALYSIS, ROUTINE W REFLEX MICROSCOPIC
Bilirubin Urine: NEGATIVE
Glucose, UA: NEGATIVE mg/dL
Hgb urine dipstick: NEGATIVE
Ketones, ur: 5 mg/dL — AB
Leukocytes,Ua: NEGATIVE
Nitrite: NEGATIVE
Protein, ur: NEGATIVE mg/dL
Specific Gravity, Urine: 1.002 — ABNORMAL LOW (ref 1.005–1.030)
pH: 7 (ref 5.0–8.0)

## 2022-01-03 LAB — LIPASE, BLOOD: Lipase: 28 U/L (ref 11–51)

## 2022-01-03 MED ORDER — DICYCLOMINE HCL 20 MG PO TABS: 20.0000 mg | ORAL_TABLET | Freq: Three times a day (TID) | ORAL | 0 refills | Status: DC | PRN

## 2022-01-03 NOTE — ED Notes (Signed)
Pt left after Nurse tech removed IV and before waiting on discharge paperwork. Pt also did not sign for discharge. ?

## 2022-01-03 NOTE — ED Triage Notes (Signed)
Patient reports abd pain that has progressed over the week.  Has been on 14 days of fluconazole for suspected candiasis infection in intestines.  Reports bloating and yellow and white mucous in stool  ?

## 2022-01-04 NOTE — ED Provider Notes (Signed)
?[facility] ?Provider Note ? ? ?CSN: [id number] ?Arrival date & time: 01/03/22  1802 ? ?  ? ?History ? ?Chief Complaint  ?Patient presents with  ? Abdominal Pain  ? ? ?Logan Benton is a 41 y.o. male. ? ? ?Abdominal Pain ?Associated symptoms: diarrhea   ?Associated symptoms: no fever   ?Patient presents with abdominal pain.  Somewhat acute on chronic.  Has had long issues with his abdomen.  Has seen GI multiple times.  Has been treated with deworming treatment few times.  Has been on recently antifungal treatment.  Benign abdominal exam.  States he has pain that is crampy.  Is scheduled for follow-up with Dr. Abbey Chatters.   recently started on another treatment of Diflucan by urgent care. ?  ? ?Home Medications ?Prior to Admission medications   ?Medication Sig Start Date End Date Taking? Authorizing Provider  ?Ascorbic Acid (VITAMIN C PO) Take 1 tablet by mouth daily.   Yes [provider]  ?dicyclomine (BENTYL) 20 MG tablet Take 1 tablet (20 mg total) by mouth 3 (three) times daily as needed for spasms. 01/03/22  Yes Davonna Belling, MD  ?lisinopril (ZESTRIL) 5 MG tablet Take 1 tablet (5 mg total) by mouth at bedtime. 06/19/21  Yes Lindell Spar, MD  ?Multiple Vitamin (MULTIVITAMIN WITH MINERALS) TABS tablet Take 1 tablet by mouth daily.   Yes [provider]  ?omeprazole (PRILOSEC) 20 MG capsule Take 1 capsule (20 mg total) by mouth every morning. 01/01/22  Yes Lindell Spar, MD  ?Probiotic Product (PROBIOTIC-10 PO) Take 1 capsule by mouth daily.   Yes [provider]  ?   ? ?Allergies    ?Patient has no known allergies.   ? ?Review of Systems   ?Review of Systems  ?Constitutional:  Negative for fever.  ?HENT:  Negative for congestion.   ?Gastrointestinal:  Positive for abdominal pain and diarrhea.  ?Neurological:  Negative for weakness.  ? ?Physical Exam ?Updated Vital Signs ?BP 103/70   Pulse 62   Temp 98.2 ?F (36.8 ?C) (Oral)   Resp 18   Ht '5\' 11"'$  (1.803 m)    Wt 69.9 kg   SpO2 98%   BMI 21.48 kg/m?  ?Physical Exam ?Vitals and nursing note reviewed.  ?HENT:  ?   Head: Normocephalic.  ?Abdominal:  ?   General: Abdomen is scaphoid.  ?   Tenderness: There is no abdominal tenderness.  ?Skin: ?   General: Skin is warm.  ?   Capillary Refill: Capillary refill takes less than 2 seconds.  ?Neurological:  ?   Mental Status: He is alert.  ? ? ?ED Results / Procedures / Treatments   ?Labs ?(all labs ordered are listed, but only abnormal results are displayed) ?Labs Reviewed  ?URINALYSIS, ROUTINE W REFLEX MICROSCOPIC - Abnormal; Notable for the following components:  ?    Result Value  ? Color, Urine COLORLESS (*)   ? Specific Gravity, Urine 1.002 (*)   ? Ketones, ur 5 (*)   ? All other components within normal limits  ?LIPASE, BLOOD  ?COMPREHENSIVE METABOLIC PANEL  ?CBC  ? ? ?EKG ?None ? ?Radiology ?No results found. ? ?Procedures ?Procedures  ? ? ?Medications Ordered in ED ?Medications - No data to display ? ?ED Course/ Medical Decision Making/ A&P ?  ?                        ?Medical Decision Making ?Amount and/or Complexity of Data  Reviewed ?Labs: ordered. ? ?Risk ?Prescription drug management. ? ? ?Patient with some acute on chronic abdominal pain.  Has been treated with multiple different antiparasitic medicines and different antifungals.  States he has had some relief at times but comes back worse.  Recently finished fluconazole treatment that he got to urgent care.  Has seen GI.  Has seen infectious disease.  Lab work done today and reassuring.  Benign abdominal exam.  Reviewing records previously had a CT scan with some colitis.  Had been ordered to be done another CT today, however CT scan went down after bedbug found and was not available.  Discussed with patient about waiting versus following up as an outpatient.  Patient states he was just worried more about his blood work and can hold off on the CT scan at this time.  Did request another antiparasitic medicine since  his states abdomen still bothering him.  I told him that would have to come through gastroenterology infectious disease and would not do more medicines without further proof of the infection.  Will discharge ? ? ? ? ? ? ? ?Final Clinical Impression(s) / ED Diagnoses ?Final diagnoses:  ?Generalized abdominal pain  ? ? ?Rx / DC Orders ?ED Discharge Orders   ? ?      Ordered  ?  dicyclomine (BENTYL) 20 MG tablet  3 times daily PRN       ? 01/03/22 2126  ? ?  ?  ? ?  ? ? ?  ?Davonna Belling, MD ?01/04/22 0031 ? ?

## 2022-01-07 ENCOUNTER — Telehealth (INDEPENDENT_AMBULATORY_CARE_PROVIDER_SITE_OTHER): Payer: 59 | Admitting: Gastroenterology

## 2022-01-07 ENCOUNTER — Ambulatory Visit: Payer: 59 | Admitting: Gastroenterology

## 2022-01-07 ENCOUNTER — Telehealth: Payer: Self-pay | Admitting: *Deleted

## 2022-01-07 VITALS — Ht 71.0 in | Wt 150.0 lb

## 2022-01-07 DIAGNOSIS — R109 Unspecified abdominal pain: Secondary | ICD-10-CM

## 2022-01-07 DIAGNOSIS — K824 Cholesterolosis of gallbladder: Secondary | ICD-10-CM

## 2022-01-07 NOTE — Telephone Encounter (Signed)
Pt consented to a virtual visit. 

## 2022-01-07 NOTE — Progress Notes (Signed)
? ? ? ? ? ?Primary Care Physician:  Lindell Spar, MD  ?Primary GI: Dr. Abbey Chatters ? ?Patient Location: Home ?  ?Provider Location: [place] office ?  ?Reason for Visit: Follow-up ?  ?Persons present on the virtual encounter, with roles: Patient and NP ?  ?Total time (minutes) spent on medical discussion: 10 minutes ?  ?Due to COVID-19, visit was conducted using virtual method.  Visit was requested by patient. ? ?Virtual Visit via MyChart Video Note ?Due to COVID-19, visit is conducted virtually and was requested by patient.  ? ?I connected with [place] on 01/07/22 at  2:30 PM EDT by video and verified that I am speaking with the correct person using two identifiers. ?  ?I discussed the limitations, risks, security and privacy concerns of performing an evaluation and management service by video and the availability of in person appointments. I also discussed with the patient that there may be a patient responsible charge related to this service. The patient expressed understanding and agreed to proceed. ? ?Chief Complaint  ?Patient presents with  ? Follow-up  ?  2 weeks ago went to Urgent care , heaviness in abdomen, itches in abdomen , went to ER past ER couldn't do CAT scan   ? ? ? ?History of Present Illness: ?Logan Benton is a 41 year old male presenting today with interesting past medical history. Initially seen by GI in Sept 2021 with epigastric pain, then with concerns for pinworms in March 2022 and took multiple courses of therapy by other providers. He has had multiple negative O&P testing in past. Has seen ID and also negative. Treated multiple times for oral candida with fluconazole by other providers. HIV negative. 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 He requested updated procedures due to his concerns of mucus in stool and abdominal pain. Updated Colonoscopy Aug 2022 unremarkable, EGD negative.  ? ?Had tape testing in Dec 2022 negative. Last imaging July 2022  of CT abdomen without contrast noted mild symmetric thickening of gastric antrum (EGD was done thereafter that was unimpressive). [place] in July 2021 with incidental 3 mm gallbladder polyp. Needs updated [place] for this for surveillance.  ? ?Constellation of symptoms as follows: Notes fatigue, abdominal pain, bloating, "gut itch", yellow/white mucus in stool. White globs in stool with stringy tendrils. Concerned about candida overgrowth, worse with processed sugar/carbs. Notes heaviness in his stomach, bloating, white/yellow discharge from rectum at times. Was given Fluconazole in past and felt this improved his overall symptoms. Notes "gut itch". Feels painful in his "gut" about 75% if the time. Alternates between constipation and diarrhea. Occasional nausea. No GERD issues. Feels bloating and pressure . LUQ pain.  ? ? ?GERD: omeprazole.  ? ?Prior therapies: Xifaxan not helpful. Declining TCA. Willing to try dicyclomine as needed. He wants a fungal stool test. Concerned about candida overgrowth in his gut.  ? ?Numerous labs normal. LFTs normal. CBC normal. He has had numerous stool cultures, fungus culture, even blood cultures at his request that were negative. He is insistent that there is a type of dysbiosis occurring.  ? ? ?Past Medical History:  ?Diagnosis Date  ? Abscessed tooth   ? Anxiety   ? C. difficile diarrhea   ? presumptive diagnosis  ? Depression   ? Hypertension   ? Trigger finger   ? ? ? ?Past Surgical History:  ?Procedure Laterality Date  ? BILATERAL CARPAL TUNNEL RELEASE    ? BIOPSY  06/29/2020  ? Procedure:  BIOPSY;  Surgeon: Eloise Harman, DO;  [facility];  Service: Endoscopy;;  ? BIOPSY  09/03/2020  ? Procedure: BIOPSY;  Surgeon: Eloise Harman, DO;  [facility];  Service: Endoscopy;;  ? BIOPSY  03/08/2021  ? Procedure: BIOPSY;  Surgeon: Eloise Harman, DO;  [facility];  Service: Endoscopy;;  ? BIOPSY  05/10/2021  ? Procedure: BIOPSY;  Surgeon: Eloise Harman, DO;  [facility];  Service: Endoscopy;;  ? COLONOSCOPY WITH PROPOFOL N/A 06/29/2020  ? Dr. Abbey Chatters: 4 mm tubular adenoma removed, nonbleeding internal hemorrhoids.  Repeat colonoscopy in 7 years.  ? COLONOSCOPY WITH PROPOFOL N/A 05/10/2021  ? Procedure: COLONOSCOPY WITH PROPOFOL;  Surgeon: Eloise Harman, DO;  [facility];  Service: Endoscopy;  Laterality: N/A;  1:15pm  ? ESOPHAGOGASTRODUODENOSCOPY (EGD) WITH PROPOFOL N/A 06/29/2020  ? Dr. Abbey Chatters: LA grade C esophagitis, gastritis with mild chronic gastritis on path, small bowel biopsy unremarkable.  ? ESOPHAGOGASTRODUODENOSCOPY (EGD) WITH PROPOFOL N/A 09/03/2020  ? Dr. Abbey Chatters: Esophageal mucosal changes suggestive of short segment Barrett's esophagus, biopsies consistent.  Next EGD in 3 years  ? ESOPHAGOGASTRODUODENOSCOPY (EGD) WITH PROPOFOL N/A 03/08/2021  ? Procedure: ESOPHAGOGASTRODUODENOSCOPY (EGD) WITH PROPOFOL;  Surgeon: Eloise Harman, DO;  [facility];  Service: Endoscopy;  Laterality: N/A;  1:00pm  ? POLYPECTOMY  06/29/2020  ? Procedure: POLYPECTOMY;  Surgeon: Eloise Harman, DO;  [facility];  Service: Endoscopy;;  ? SHOULDER SURGERY Right   ? x2  ? TRIGGER FINGER RELEASE Right 2016  ? ? ? ?Current Meds  ?Medication Sig  ? Ascorbic Acid (VITAMIN C PO) Take 1 tablet by mouth daily.  ? lisinopril (ZESTRIL) 5 MG tablet Take 1 tablet (5 mg total) by mouth at bedtime.  ? Multiple Vitamin (MULTIVITAMIN WITH MINERALS) TABS tablet Take 1 tablet by mouth daily.  ? omeprazole (PRILOSEC) 20 MG capsule Take 1 capsule (20 mg total) by mouth every morning.  ? Probiotic Product (PROBIOTIC-10 PO) Take 1 capsule by mouth daily.  ?  ? ?Family History  ?Problem Relation Age of Onset  ? Rheum arthritis Mother   ? Bladder Cancer Mother   ? Kidney disease Father   ? Bladder Cancer Maternal Grandfather   ? Irritable bowel syndrome Paternal Grandmother   ? Colon cancer Neg Hx   ? Celiac disease Neg Hx   ? Inflammatory bowel  disease Neg Hx   ? ? ?Social History  ? ?Socioeconomic History  ? Marital status: Divorced  ?  Spouse name: Not on file  ? Number of children: Not on file  ? Years of education: Not on file  ? Highest education level: Not on file  ?Occupational History  ? Not on file  ?Tobacco Use  ? Smoking status: Former  ?  Packs/day: 0.00  ?  Years: 0.00  ?  Pack years: 0.00  ?  Types: Cigarettes  ?  Quit date: 11/03/2019  ?  Years since quitting: 2.1  ? Smokeless tobacco: Never  ? Tobacco comments:  ?  nicoten gum  ?Vaping Use  ? Vaping Use: Former  ? Quit date: 03/07/2019  ?Substance and Sexual Activity  ? Alcohol use: No  ? Drug use: Yes  ?  Types: Marijuana  ? Sexual activity: Not Currently  ?  Partners: Female  ?Other Topics Concern  ? Not on file  ?Social History Narrative  ? Not on file  ? ?Social Determinants of Health  ? ?  Financial Resource Strain: Not on file  ?Food Insecurity: Not on file  ?Transportation Needs: Not on file  ?Physical Activity: Not on file  ?Stress: Not on file  ?Social Connections: Not on file  ? ? ? ? ? Review of Systems: ?Gen: Denies fever, chills, anorexia. Denies fatigue, weakness, weight loss.  ?CV: Denies chest pain, palpitations, syncope, peripheral edema, and claudication. ?Resp: Denies dyspnea at rest, cough, wheezing, coughing up blood, and pleurisy. ?GI: see HPI ?Derm: Denies rash, itching, dry skin ?Psych: Denies depression, anxiety, memory loss, confusion. No homicidal or suicidal ideation.  ?Heme: Denies bruising, bleeding, and enlarged lymph nodes. ? ?Observations/Objective: ?No distress. Unable to perform physical exam due to video encounter.  ? ?Assessment and Plan: ?Logan Benton is a 41 year old male presenting today with interesting past medical history. Initially seen by GI in Sept 2021 with epigastric pain, then with concerns for pinworms in March 2022 and took multiple courses of therapy by other providers. He has had multiple negative O&P testing in past. Has seen ID and also  negative. Treated multiple times for oral candida with fluconazole by other providers. HIV negative. Returning today with numerous concerns.  ? ?Abdominal pain: described as an "itch". Occasional nausea bu

## 2022-01-07 NOTE — Telephone Encounter (Signed)
Clell L Sullenger, you are scheduled for a virtual visit with your provider today.  Just as we do with appointments in the office, we must obtain your consent to participate.  Your consent will be active for this visit and any virtual visit you may have with one of our providers in the next 365 days.  If you have a MyChart account, I can also send a copy of this consent to you electronically.  All virtual visits are billed to your insurance company just like a traditional visit in the office.  As this is a virtual visit, video technology does not allow for your provider to perform a traditional examination.  This may limit your provider's ability to fully assess your condition.  If your provider identifies any concerns that need to be evaluated in person or the need to arrange testing such as labs, EKG, etc, we will make arrangements to do so.  Although advances in technology are sophisticated, we cannot ensure that it will always work on either your end or our end.  If the connection with a video visit is poor, we may have to switch to a telephone visit.  With either a video or telephone visit, we are not always able to ensure that we have a secure connection.   I need to obtain your verbal consent now.   Are you willing to proceed with your visit today?  ?

## 2022-01-07 NOTE — Patient Instructions (Signed)
We are referring you to a tertiary care center for more evaluation. ? ?You can take dicyclomine up to 4 times a day as needed for abdominal cramping. Side effects can be constipation, dry mouth, dizziness, and drowsiness. Take sparingly to start! ? ?I enjoyed seeing you again today! As you know, I value our relationship and want to provide genuine, compassionate, and quality care. I welcome your feedback. If you receive a survey regarding your visit,  I greatly appreciate you taking time to fill this out. See you next time! ? ?Annitta Needs, PhD, ANP-BC ?[facility]  ? ?

## 2022-01-08 ENCOUNTER — Ambulatory Visit: Payer: 59 | Admitting: Gastroenterology

## 2022-01-14 ENCOUNTER — Telehealth: Payer: Self-pay | Admitting: Gastroenterology

## 2022-01-14 DIAGNOSIS — R1013 Epigastric pain: Secondary | ICD-10-CM

## 2022-01-14 NOTE — Telephone Encounter (Signed)
RUQ [place] scheduled for 01/20/22 at 8:30am, arrive at 8:00am. NPO after midnight before test. ? ?MyChart message sent to pt to inform him of [place] appt. ?

## 2022-01-14 NOTE — Telephone Encounter (Signed)
[facility] clinical pool: I also see he is overdue for RUQ [place] due to history of gallbladder polyp. I have placed orders for that as well.  ?

## 2022-01-14 NOTE — Addendum Note (Signed)
Addended by: Zara Council C on: 01/14/2022 01:15 PM ? ? Modules accepted: Orders ? ?

## 2022-01-14 NOTE — Telephone Encounter (Signed)
Patient was seen last week. Please refer him to tertiary care for second opinion. Reason: dyspepsia, prior history of parasite infection but appropriately treated. Patient concerned about persistent infection despite multiple courses and ID evaluation.  ? ?Please let patient know where you will be referring him ([facility], [facility], or [facility]).  ?

## 2022-01-14 NOTE — Telephone Encounter (Signed)
Referral faxed to [facility]. MyChart message sent to pt to inform him. Phone number given for [facility]. ?

## 2022-01-15 NOTE — Telephone Encounter (Signed)
See other phone note from 7:23am ?

## 2022-01-15 NOTE — Telephone Encounter (Signed)
Logan Benton, ?I've sent the pt a message back and I advised him that I would send on to you since Dr. Abbey Chatters and Vicente Males both are out the rest of the week. ?

## 2022-01-16 ENCOUNTER — Other Ambulatory Visit: Payer: Self-pay | Admitting: Gastroenterology

## 2022-01-16 ENCOUNTER — Encounter: Payer: Self-pay | Admitting: Internal Medicine

## 2022-01-16 DIAGNOSIS — B370 Candidal stomatitis: Secondary | ICD-10-CM

## 2022-01-16 MED ORDER — FLUCONAZOLE 100 MG PO TABS: 200.0000 mg | ORAL_TABLET | Freq: Every day | ORAL | 0 refills | Status: AC

## 2022-01-17 ENCOUNTER — Other Ambulatory Visit: Payer: Self-pay

## 2022-01-17 DIAGNOSIS — I10 Essential (primary) hypertension: Secondary | ICD-10-CM

## 2022-01-17 MED ORDER — LISINOPRIL 5 MG PO TABS: 5.0000 mg | ORAL_TABLET | Freq: Every day | ORAL | 5 refills | Status: DC

## 2022-01-17 NOTE — Telephone Encounter (Signed)
noted 

## 2022-01-20 ENCOUNTER — Other Ambulatory Visit: Payer: Self-pay

## 2022-01-20 ENCOUNTER — Telehealth: Payer: Self-pay | Admitting: Internal Medicine

## 2022-01-20 ENCOUNTER — Ambulatory Visit (HOSPITAL_COMMUNITY)
Admission: RE | Admit: 2022-01-20 | Discharge: 2022-01-20 | Disposition: A | Discharge status: Home / Self Care | Payer: 59 | Source: Ambulatory Visit | Attending: Gastroenterology | Admitting: Gastroenterology

## 2022-01-20 DIAGNOSIS — K824 Cholesterolosis of gallbladder: Secondary | ICD-10-CM | POA: Insufficient documentation

## 2022-01-20 NOTE — Telephone Encounter (Signed)
Patient returned call

## 2022-01-20 NOTE — Telephone Encounter (Signed)
See result note.  

## 2022-01-20 NOTE — Progress Notes (Signed)
B

## 2022-01-29 ENCOUNTER — Other Ambulatory Visit: Payer: Self-pay | Admitting: Internal Medicine

## 2022-01-29 MED ORDER — OMEPRAZOLE 20 MG PO CPDR: 20.0000 mg | DELAYED_RELEASE_CAPSULE | Freq: Every morning | ORAL | 0 refills | Status: DC

## 2022-01-30 ENCOUNTER — Other Ambulatory Visit: Payer: Self-pay

## 2022-01-30 ENCOUNTER — Encounter: Payer: Self-pay | Admitting: General Surgery

## 2022-01-30 ENCOUNTER — Ambulatory Visit: Payer: 59 | Admitting: General Surgery

## 2022-01-30 VITALS — BP 105/67 | HR 60 | Temp 98.7°F | Resp 12 | Ht 71.0 in | Wt 153.0 lb

## 2022-01-30 DIAGNOSIS — K824 Cholesterolosis of gallbladder: Secondary | ICD-10-CM | POA: Diagnosis not present

## 2022-01-30 NOTE — Progress Notes (Signed)
Logan Benton; [id number]; [birth year] ? ? ?HPI ?Patient is a 41 year old white male who was referred to my care by Roseanne Kaufman of gastroenterology for evaluation and treatment of gallbladder polyps.  He has multiple GI issues including left upper quadrant abdominal pain, bloating, bowel frequency, and back pain.  He is seeking a second opinion at [facility] gastroenterology later next month.  He was found on ultrasound of the gallbladder to have several polyps, the greatest size being 5 mm.  He denies any fatty food intolerance.  He does not have specific right upper quadrant abdominal pain with radiation to the right flank.  He denies any fever, chills, or jaundice. ?Past Medical History:  ?Diagnosis Date  ? Abscessed tooth   ? Anxiety   ? C. difficile diarrhea   ? presumptive diagnosis  ? Depression   ? Hypertension   ? Trigger finger   ? ? ?Past Surgical History:  ?Procedure Laterality Date  ? BILATERAL CARPAL TUNNEL RELEASE    ? BIOPSY  06/29/2020  ? Procedure: BIOPSY;  Surgeon: Eloise Harman, DO;  [facility];  Service: Endoscopy;;  ? BIOPSY  09/03/2020  ? Procedure: BIOPSY;  Surgeon: Eloise Harman, DO;  [facility];  Service: Endoscopy;;  ? BIOPSY  03/08/2021  ? Procedure: BIOPSY;  Surgeon: Eloise Harman, DO;  [facility];  Service: Endoscopy;;  ? BIOPSY  05/10/2021  ? Procedure: BIOPSY;  Surgeon: Eloise Harman, DO;  [facility];  Service: Endoscopy;;  ? COLONOSCOPY WITH PROPOFOL N/A 06/29/2020  ? Dr. Abbey Chatters: 4 mm tubular adenoma removed, nonbleeding internal hemorrhoids.  Repeat colonoscopy in 7 years.  ? COLONOSCOPY WITH PROPOFOL N/A 05/10/2021  ? Procedure: COLONOSCOPY WITH PROPOFOL;  Surgeon: Eloise Harman, DO;  [facility];  Service: Endoscopy;  Laterality: N/A;  1:15pm  ? ESOPHAGOGASTRODUODENOSCOPY (EGD) WITH PROPOFOL N/A 06/29/2020  ? Dr. Abbey Chatters: LA grade C esophagitis, gastritis with mild chronic gastritis on path, small  bowel biopsy unremarkable.  ? ESOPHAGOGASTRODUODENOSCOPY (EGD) WITH PROPOFOL N/A 09/03/2020  ? Dr. Abbey Chatters: Esophageal mucosal changes suggestive of short segment Barrett's esophagus, biopsies consistent.  Next EGD in 3 years  ? ESOPHAGOGASTRODUODENOSCOPY (EGD) WITH PROPOFOL N/A 03/08/2021  ? Procedure: ESOPHAGOGASTRODUODENOSCOPY (EGD) WITH PROPOFOL;  Surgeon: Eloise Harman, DO;  [facility];  Service: Endoscopy;  Laterality: N/A;  1:00pm  ? POLYPECTOMY  06/29/2020  ? Procedure: POLYPECTOMY;  Surgeon: Eloise Harman, DO;  [facility];  Service: Endoscopy;;  ? SHOULDER SURGERY Right   ? x2  ? TRIGGER FINGER RELEASE Right 2016  ? ? ?Family History  ?Problem Relation Age of Onset  ? Rheum arthritis Mother   ? Bladder Cancer Mother   ? Kidney disease Father   ? Bladder Cancer Maternal Grandfather   ? Irritable bowel syndrome Paternal Grandmother   ? Colon cancer Neg Hx   ? Celiac disease Neg Hx   ? Inflammatory bowel disease Neg Hx   ? ? ?Current Outpatient Medications on File Prior to Visit  ?Medication Sig Dispense Refill  ? Ascorbic Acid (VITAMIN C PO) Take 1 tablet by mouth daily.    ? lisinopril (ZESTRIL) 5 MG tablet Take 1 tablet (5 mg total) by mouth at bedtime. 30 tablet 5  ? Multiple Vitamin (MULTIVITAMIN WITH MINERALS) TABS tablet Take 1 tablet by mouth daily.    ? omeprazole (PRILOSEC) 20 MG capsule Take 1 capsule (20 mg total) by mouth  every morning. 30 capsule 0  ? Probiotic Product (PROBIOTIC-10 PO) Take 1 capsule by mouth daily.    ? dicyclomine (BENTYL) 20 MG tablet Take 1 tablet (20 mg total) by mouth 3 (three) times daily as needed for spasms. (Patient not taking: Reported on 01/07/2022) 10 tablet 0  ? ?No current facility-administered medications on file prior to visit.  ? ? ?No Known Allergies ? ?Social History  ? ?Substance and Sexual Activity  ?Alcohol Use No  ? ? ?Social History  ? ?Tobacco Use  ?Smoking Status Former  ? Packs/day: 0.00  ? Years: 0.00  ? Pack years: 0.00   ? Types: Cigarettes  ? Quit date: 11/03/2019  ? Years since quitting: 2.2  ?Smokeless Tobacco Never  ?Tobacco Comments  ? nicoten gum  ? ? ?Review of Systems  ?Constitutional:  Positive for malaise/fatigue.  ?HENT:  Positive for sore throat.   ?Eyes: Negative.   ?Respiratory: Negative.    ?Cardiovascular:  Positive for chest pain.  ?Gastrointestinal:  Positive for abdominal pain and heartburn.  ?Musculoskeletal:  Positive for back pain, joint pain and neck pain.  ?Skin: Negative.   ?Neurological: Negative.   ?Endo/Heme/Allergies: Negative.   ?Psychiatric/Behavioral: Negative.    ? ?Objective  ? ?Vitals:  ? 01/30/22 1126  ?BP: 105/67  ?Pulse: 60  ?Resp: 12  ?Temp: 98.7 ?F (37.1 ?C)  ?SpO2: 95%  ? ? ?Physical Exam ?Vitals reviewed.  ?Constitutional:   ?   Appearance: Normal appearance. He is normal weight. He is not ill-appearing.  ?HENT:  ?   Head: Normocephalic and atraumatic.  ?Eyes:  ?   General: No scleral icterus. ?Cardiovascular:  ?   Rate and Rhythm: Normal rate and regular rhythm.  ?   Heart sounds: Normal heart sounds. No murmur heard. ?  No friction rub. No gallop.  ?Pulmonary:  ?   Effort: Pulmonary effort is normal. No respiratory distress.  ?   Breath sounds: Normal breath sounds. No stridor. No wheezing, rhonchi or rales.  ?Abdominal:  ?   General: Abdomen is flat. Bowel sounds are normal. There is no distension.  ?   Palpations: Abdomen is soft. There is no mass.  ?   Tenderness: There is no abdominal tenderness. There is no guarding or rebound.  ?   Hernia: No hernia is present.  ?Skin: ?   General: Skin is warm and dry.  ?Neurological:  ?   Mental Status: He is alert and oriented to person, place, and time.  ?GI notes reviewed, ultrasound report reviewed ? ?Assessment  ?Gallbladder polyps, 5 mm in greatest size.  Multiple GI complaints. ?At this point, he does not meet the criteria for undergoing a cholecystectomy for gallbladder polyps.  Would consider cholecystectomy if there is a significant  change in size or a polyp greater than around 15 mm in size.  That is when the risk of gallbladder malignancy is more pronounced. ?Plan  ?I would not suggest the cholecystectomy at this time.  He does have a follow-up appointment with [facility] gastroenterology.  Should they determine that a cholecystectomy is warranted, I would be more than happy to perform a cholecystectomy.  Patient understands that and agrees.  Follow-up as needed.  He should have a follow-up ultrasound of the gallbladder in 1 year to monitor the gallbladder polyps. ?

## 2022-02-10 ENCOUNTER — Telehealth: Payer: Self-pay

## 2022-02-10 NOTE — Telephone Encounter (Signed)
Logan Benton, ?The pt LMOVM stating that his symptoms are back and he needs more fluconazole until his appt with [facility] in a few weeks. Kristen sent in a Rx last month for him. He advised me he sent a mychart message as well. Please advise ?

## 2022-02-11 NOTE — Telephone Encounter (Signed)
I have addressed in [place].  ?

## 2022-02-11 NOTE — Telephone Encounter (Signed)
Sent Vicente Males note yesterday ?

## 2022-02-17 MED ORDER — NYSTATIN 100000 UNIT/ML MT SUSP: 5.0000 mL | Freq: Four times a day (QID) | OROMUCOSAL | 1 refills | Status: DC

## 2022-02-18 NOTE — Addendum Note (Signed)
Addended by: Annitta Needs on: 02/18/2022 09:27 AM ? ? Modules accepted: Orders ? ?

## 2022-02-19 NOTE — Telephone Encounter (Signed)
Contacted [facility] and requested sugar-free Nystatin to be compounded. 5 ml QID X 2 weeks, with one refill.  ?

## 2022-03-04 ENCOUNTER — Ambulatory Visit: Payer: Self-pay | Admitting: Gastroenterology

## 2022-03-04 ENCOUNTER — Other Ambulatory Visit: Payer: Self-pay | Admitting: Internal Medicine

## 2022-03-05 ENCOUNTER — Encounter: Payer: Self-pay | Admitting: Internal Medicine

## 2022-03-05 NOTE — Telephone Encounter (Signed)
Logan Benton, please arrange appointment with me or Dr. Abbey Chatters in next 1-2 weeks.

## 2022-03-06 ENCOUNTER — Other Ambulatory Visit: Payer: Self-pay | Admitting: *Deleted

## 2022-03-06 ENCOUNTER — Telehealth: Payer: Self-pay

## 2022-03-06 MED ORDER — OMEPRAZOLE 20 MG PO CPDR
20.0000 mg | DELAYED_RELEASE_CAPSULE | Freq: Every morning | ORAL | 0 refills | Status: DC
Start: 2022-03-06 — End: 2022-05-05

## 2022-03-06 NOTE — Telephone Encounter (Signed)
Logan Benton is not his pcp awaiting decision from provider

## 2022-03-06 NOTE — Telephone Encounter (Signed)
Patient called need med refill  omeprazole (PRILOSEC) 20 MG capsule  Pharmacy: [facility]

## 2022-03-12 ENCOUNTER — Ambulatory Visit (INDEPENDENT_AMBULATORY_CARE_PROVIDER_SITE_OTHER): Payer: 59 | Admitting: Gastroenterology

## 2022-03-12 ENCOUNTER — Encounter: Payer: Self-pay | Admitting: Gastroenterology

## 2022-03-12 VITALS — BP 121/70 | HR 69 | Temp 98.2°F | Ht 71.0 in | Wt 158.0 lb

## 2022-03-12 DIAGNOSIS — R1011 Right upper quadrant pain: Secondary | ICD-10-CM

## 2022-03-12 NOTE — Patient Instructions (Signed)
Please complete blood work today.  I am referring you back to Dr. Arnoldo Morale.  I am also referring you to [facility]!  Further recommendations to follow  I enjoyed seeing you again today! As you know, I value our relationship and want to provide genuine, compassionate, and quality care. I welcome your feedback. If you receive a survey regarding your visit,  I greatly appreciate you taking time to fill this out. See you next time!  Annitta Needs, PhD, ANP-BC [facility]

## 2022-03-12 NOTE — Progress Notes (Signed)
Gastroenterology Office Note     Primary Care Physician:  No primary care provider on file.  Primary Gastroenterologist: Dr. Abbey Chatters   Chief Complaint   Chief Complaint  Patient presents with   Abdominal Pain    Abdominal pain on right side for about one week. Mostly feels like pressure, has some bloating, feels like itching on the inside. At times pain can be sharp. Eating makes pain worse. Has not tried any treatment for pain. When symptoms flare up he also notices brain fog, also has a burning in his mouth that is always present.      History of Present Illness   Logan Benton is a 41 y.o. male presenting today in follow-up with an interesting past medical history. Initially seen by GI in Sept 2021 with epigastric pain, then with concerns for pinworms in March 2022 and took multiple courses of therapy by other providers. He has had multiple negative O&P testing in past. Has seen ID and also negative. Treated multiple times for oral candida with fluconazole by other providers. HIV negative. 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 He requested updated procedures due to his concerns of mucus in stool and abdominal pain. Updated Colonoscopy Aug 2022 unremarkable, EGD negative. He has been seen by [facility] GI at our request. I do not have the note available at this time.    Had tape testing in Dec 2022 negative. Last imaging July 2022 of CT abdomen without contrast noted mild symmetric thickening of gastric antrum (EGD was done thereafter that was unimpressive). [place] in July 2021 with incidental 3 mm gallbladder polyp. Update [place] April 2023 with 2 gallbladder polyps, largest of which was 5 mm. He saw Dr. Arnoldo Morale for consideration of cholecystectomy due to gallbladder polyp but as not having biliary symptoms, cholecystectomy deferred.   Constellation of symptoms include: RUQ pain/pressure postprandially. Bloating and feeling full despite small amounts.  Notes "gut itch" as he describes about 20 minutes after eating. Not painful. Feels like Diflucan suppresses symptoms.Feels stool is better formed when taking it.   Omeprazole is controlling GERD symptoms.    Prior therapies: Xifaxan not helpful. Declining TCA as states this worsens anxiety.       Past Medical History:  Diagnosis Date   Abscessed tooth    Anxiety    C. difficile diarrhea    presumptive diagnosis   Depression    Hypertension    Trigger finger     Past Surgical History:  Procedure Laterality Date   BILATERAL CARPAL TUNNEL RELEASE     BIOPSY  06/29/2020   Procedure: BIOPSY;  Surgeon: Eloise Harman, DO;  [facility];  Service: Endoscopy;;   BIOPSY  09/03/2020   Procedure: BIOPSY;  Surgeon: Eloise Harman, DO;  [facility];  Service: Endoscopy;;   BIOPSY  03/08/2021   Procedure: BIOPSY;  Surgeon: Eloise Harman, DO;  [facility];  Service: Endoscopy;;   BIOPSY  05/10/2021   Procedure: BIOPSY;  Surgeon: Eloise Harman, DO;  [facility];  Service: Endoscopy;;   COLONOSCOPY WITH PROPOFOL N/A 06/29/2020   Dr. Abbey Chatters: 4 mm tubular adenoma removed, nonbleeding internal hemorrhoids.  Repeat colonoscopy in 7 years.   COLONOSCOPY WITH PROPOFOL N/A 05/10/2021   Procedure: COLONOSCOPY WITH PROPOFOL;  Surgeon: Eloise Harman, DO;  [facility];  Service: Endoscopy;  Laterality: N/A;  1:15pm   ESOPHAGOGASTRODUODENOSCOPY (EGD) WITH PROPOFOL N/A  06/29/2020   Dr. Abbey Chatters: LA grade C esophagitis, gastritis with mild chronic gastritis on path, small bowel biopsy unremarkable.   ESOPHAGOGASTRODUODENOSCOPY (EGD) WITH PROPOFOL N/A 09/03/2020   Dr. Abbey Chatters: Esophageal mucosal changes suggestive of short segment Barrett's esophagus, biopsies consistent.  Next EGD in 3 years   ESOPHAGOGASTRODUODENOSCOPY (EGD) WITH PROPOFOL N/A 03/08/2021   Procedure: ESOPHAGOGASTRODUODENOSCOPY (EGD) WITH PROPOFOL;  Surgeon: Eloise Harman,  DO;  [facility];  Service: Endoscopy;  Laterality: N/A;  1:00pm   POLYPECTOMY  06/29/2020   Procedure: POLYPECTOMY;  Surgeon: Eloise Harman, DO;  [facility];  Service: Endoscopy;;   SHOULDER SURGERY Right    x2   TRIGGER FINGER RELEASE Right 2016    Current Outpatient Medications  Medication Sig Dispense Refill   Ascorbic Acid (VITAMIN C PO) Take 1 tablet by mouth daily.     lisinopril (ZESTRIL) 5 MG tablet Take 1 tablet (5 mg total) by mouth at bedtime. 30 tablet 5   Multiple Vitamin (MULTIVITAMIN WITH MINERALS) TABS tablet Take 1 tablet by mouth daily.     omeprazole (PRILOSEC) 20 MG capsule Take 1 capsule (20 mg total) by mouth every morning. 90 capsule 0   Probiotic Product (PROBIOTIC-10 PO) Take 1 capsule by mouth daily.     No current facility-administered medications for this visit.    Allergies as of 03/12/2022   (No Known Allergies)    Family History  Problem Relation Age of Onset   Rheum arthritis Mother    Bladder Cancer Mother    Kidney disease Father    Bladder Cancer Maternal Grandfather    Irritable bowel syndrome Paternal Grandmother    Colon cancer Neg Hx    Celiac disease Neg Hx    Inflammatory bowel disease Neg Hx     Social History   Socioeconomic History   Marital status: Divorced    Spouse name: Not on file   Number of children: Not on file   Years of education: Not on file   Highest education level: Not on file  Occupational History   Not on file  Tobacco Use   Smoking status: Former    Packs/day: 0.00    Years: 0.00    Pack years: 0.00    Types: Cigarettes    Quit date: 11/03/2019    Years since quitting: 2.3    Passive exposure: Current   Smokeless tobacco: Never   Tobacco comments:    nicoten gum  Vaping Use   Vaping Use: Former   Quit date: 03/07/2019  Substance and Sexual Activity   Alcohol use: No   Drug use: Yes    Types: Marijuana   Sexual activity: Not Currently    Partners: Female  Other  Topics Concern   Not on file  Social History Narrative   Not on file   Social Determinants of Health   Financial Resource Strain: Not on file  Food Insecurity: Not on file  Transportation Needs: Not on file  Physical Activity: Not on file  Stress: Not on file  Social Connections: Not on file  Intimate Partner Violence: Not on file     Review of Systems   Gen: Denies any fever, chills, fatigue, weight loss, lack of appetite.  CV: Denies chest pain, heart palpitations, peripheral edema, syncope.  Resp: Denies shortness of breath at rest or with exertion. Denies wheezing or cough.  GI: see HPI GU : Denies urinary burning, urinary frequency, urinary hesitancy MS: Denies joint pain, muscle weakness,  cramps, or limitation of movement.  Derm: Denies rash, itching, dry skin Psych: Denies depression, anxiety, memory loss, and confusion Heme: Denies bruising, bleeding, and enlarged lymph nodes.   Physical Exam   BP 121/70 (BP Location: Left Arm, Patient Position: Sitting, Cuff Size: Normal)   Pulse 69   Temp 98.2 F (36.8 C) (Oral)   Ht '5\' 11"'$  (1.803 m)   Wt 158 lb (71.7 kg)   BMI 22.04 kg/m  General:   Alert and oriented. Pleasant and cooperative. Well-nourished and well-developed.  Head:  Normocephalic and atraumatic. Eyes:  Without icterus Abdomen:  +BS, soft, non-tender and non-distended. No HSM noted. No guarding or rebound. No masses appreciated.  Rectal:  Deferred  Msk:  Symmetrical without gross deformities. Normal posture. Extremities:  Without edema. Neurologic:  Alert and  oriented x4;  grossly normal neurologically. Skin:  Intact without significant lesions or rashes. Psych:  Alert and cooperative. Normal mood and affect.  Lab Results  Component Value Date   ALT 20 03/12/2022   AST 23 03/12/2022   ALKPHOS 39 01/03/2022   BILITOT 0.5 03/12/2022   Lab Results  Component Value Date   CREATININE 0.92 01/03/2022   BUN 15 01/03/2022   NA 136 01/03/2022   K  3.7 01/03/2022   CL 103 01/03/2022   CO2 24 01/03/2022   Lab Results  Component Value Date   WBC 6.8 01/03/2022   HGB 14.7 01/03/2022   HCT 43.5 01/03/2022   MCV 96.0 01/03/2022   PLT 245 01/03/2022   Lab Results  Component Value Date   TSH 0.720 10/21/2019       Assessment   Jahvier L Fassnacht is a 41 y.o. male presenting today in follow-up with an interesting past medical history as noted above, evaluated by ID and treated multiple times by other providers empirically for oral candida with fluconazole, recently seen by [facility] at our request due to constellation of symptoms but note not available at this time.  Per patient, [facility] instructed him to stop taking a probiotic and had no other input. Patient's constellation of symptoms include RUQ pain/pressure, bloating, "gut itch" as he describes, and he feels oral fluconazole helps suppress these symptoms. He notes recurrence after completing a course. I have only given him Nystatin, as I told him we would not prescribe systemic antifungal at this point. He has had multiple procedures and imaging.   Incidentally noted to have gallbladder polyps and seen by General Surgery. Now, patient is noting postprandial RUQ pain. I am referring back to Surgery for reconsideration of cholecystectomy, as we have evaluated extensively.   In regards to overall symptoms, he states Xifaxan has not been helpful, and he is declining a TCA due to worsening anxiety.   At this point, we have maximized our evaluation. I discussed with him referral to [facility]. He is eager to be seen by Dr. Matilde Bash.   Will continue omeprazole as this is controlling GERD symptoms.   PLAN    Continue omeprazole daily Referral back to Gen Surg now that he has RUQ pain. If no cholecystectomy, recommend [place] in 1 year Referral to Dr. Vickey Huger, PhD, ANP-BC [facility]

## 2022-03-13 LAB — HEPATIC FUNCTION PANEL
AG Ratio: 2 (calc) (ref 1.0–2.5)
ALT: 20 U/L (ref 9–46)
AST: 23 U/L (ref 10–40)
Albumin: 4.5 g/dL (ref 3.6–5.1)
Alkaline phosphatase (APISO): 39 U/L (ref 36–130)
Bilirubin, Direct: 0.1 mg/dL (ref 0.0–0.2)
Globulin: 2.3 g/dL (calc) (ref 1.9–3.7)
Indirect Bilirubin: 0.4 mg/dL (calc) (ref 0.2–1.2)
Total Bilirubin: 0.5 mg/dL (ref 0.2–1.2)
Total Protein: 6.8 g/dL (ref 6.1–8.1)

## 2022-03-18 ENCOUNTER — Other Ambulatory Visit: Payer: Self-pay | Admitting: *Deleted

## 2022-03-18 DIAGNOSIS — R140 Abdominal distension (gaseous): Secondary | ICD-10-CM

## 2022-03-18 DIAGNOSIS — R109 Unspecified abdominal pain: Secondary | ICD-10-CM

## 2022-03-22 ENCOUNTER — Emergency Department (HOSPITAL_COMMUNITY)
Admission: EM | Admit: 2022-03-22 | Discharge: 2022-03-22 | Discharge status: Against Medical Advice | Payer: Self-pay | Attending: Emergency Medicine | Admitting: Emergency Medicine

## 2022-03-22 ENCOUNTER — Emergency Department (HOSPITAL_COMMUNITY): Payer: Self-pay

## 2022-03-22 ENCOUNTER — Other Ambulatory Visit: Payer: Self-pay

## 2022-03-22 ENCOUNTER — Encounter (HOSPITAL_COMMUNITY): Payer: Self-pay | Admitting: Emergency Medicine

## 2022-03-22 DIAGNOSIS — R55 Syncope and collapse: Secondary | ICD-10-CM | POA: Insufficient documentation

## 2022-03-22 DIAGNOSIS — R42 Dizziness and giddiness: Secondary | ICD-10-CM | POA: Insufficient documentation

## 2022-03-22 DIAGNOSIS — R531 Weakness: Secondary | ICD-10-CM | POA: Insufficient documentation

## 2022-03-22 DIAGNOSIS — R0602 Shortness of breath: Secondary | ICD-10-CM | POA: Insufficient documentation

## 2022-03-22 DIAGNOSIS — Z5321 Procedure and treatment not carried out due to patient leaving prior to being seen by health care provider: Secondary | ICD-10-CM | POA: Insufficient documentation

## 2022-03-22 DIAGNOSIS — R0789 Other chest pain: Secondary | ICD-10-CM | POA: Insufficient documentation

## 2022-03-22 DIAGNOSIS — R112 Nausea with vomiting, unspecified: Secondary | ICD-10-CM | POA: Insufficient documentation

## 2022-03-22 LAB — BASIC METABOLIC PANEL
Anion gap: 8 (ref 5–15)
BUN: 22 mg/dL — ABNORMAL HIGH (ref 6–20)
CO2: 23 mmol/L (ref 22–32)
Calcium: 9.6 mg/dL (ref 8.9–10.3)
Chloride: 104 mmol/L (ref 98–111)
Creatinine, Ser: 0.98 mg/dL (ref 0.61–1.24)
GFR, Estimated: >60 mL/min (ref >60)
Glucose, Bld: 118 mg/dL — ABNORMAL HIGH (ref 70–99)
Potassium: 3.5 mmol/L (ref 3.5–5.1)
Sodium: 135 mmol/L (ref 135–145)

## 2022-03-22 LAB — CBC
HCT: 43.7 % (ref 39.0–52.0)
Hemoglobin: 15.1 g/dL (ref 13.0–17.0)
MCH: 32 pg (ref 26.0–34.0)
MCHC: 34.6 g/dL (ref 30.0–36.0)
MCV: 92.6 fL (ref 80.0–100.0)
Platelets: 244 10*3/uL (ref 150–400)
RBC: 4.72 MIL/uL (ref 4.22–5.81)
RDW: 12.5 % (ref 11.5–15.5)
WBC: 8.8 10*3/uL (ref 4.0–10.5)
nRBC: 0 % (ref 0.0–0.2)

## 2022-03-22 LAB — TROPONIN I (HIGH SENSITIVITY): Troponin I (High Sensitivity): 7 ng/L (ref <18)

## 2022-03-22 NOTE — ED Triage Notes (Addendum)
Patient c/o left side chest pain that started this morning. Per patient shortness of breath, nausea, vomiting, weakness, and dizziness. Denies any cardiac hx. [place] today and sit down because "started seeing stars." Per patient LOC for approx 1 minute.

## 2022-03-23 ENCOUNTER — Other Ambulatory Visit: Payer: Self-pay

## 2022-03-23 ENCOUNTER — Inpatient Hospital Stay (HOSPITAL_COMMUNITY)
Admission: EM | Admit: 2022-03-23 | Discharge: 2022-03-26 | DRG: 262 | Disposition: A | Discharge status: Home / Self Care | Payer: Self-pay | Attending: Internal Medicine | Admitting: Internal Medicine

## 2022-03-23 ENCOUNTER — Emergency Department (HOSPITAL_COMMUNITY): Payer: Self-pay

## 2022-03-23 ENCOUNTER — Encounter (HOSPITAL_COMMUNITY): Payer: Self-pay

## 2022-03-23 DIAGNOSIS — K2270 Barrett's esophagus without dysplasia: Secondary | ICD-10-CM | POA: Diagnosis present

## 2022-03-23 DIAGNOSIS — I4510 Unspecified right bundle-branch block: Secondary | ICD-10-CM | POA: Diagnosis present

## 2022-03-23 DIAGNOSIS — I471 Supraventricular tachycardia: Secondary | ICD-10-CM | POA: Diagnosis present

## 2022-03-23 DIAGNOSIS — Z841 Family history of disorders of kidney and ureter: Secondary | ICD-10-CM

## 2022-03-23 DIAGNOSIS — K219 Gastro-esophageal reflux disease without esophagitis: Secondary | ICD-10-CM | POA: Diagnosis present

## 2022-03-23 DIAGNOSIS — R079 Chest pain, unspecified: Secondary | ICD-10-CM | POA: Diagnosis present

## 2022-03-23 DIAGNOSIS — Z79899 Other long term (current) drug therapy: Secondary | ICD-10-CM

## 2022-03-23 DIAGNOSIS — Z87891 Personal history of nicotine dependence: Secondary | ICD-10-CM

## 2022-03-23 DIAGNOSIS — Z8052 Family history of malignant neoplasm of bladder: Secondary | ICD-10-CM

## 2022-03-23 DIAGNOSIS — I10 Essential (primary) hypertension: Secondary | ICD-10-CM | POA: Diagnosis present

## 2022-03-23 DIAGNOSIS — Z8261 Family history of arthritis: Secondary | ICD-10-CM

## 2022-03-23 DIAGNOSIS — R55 Syncope and collapse: Secondary | ICD-10-CM

## 2022-03-23 DIAGNOSIS — I498 Other specified cardiac arrhythmias: Principal | ICD-10-CM | POA: Diagnosis present

## 2022-03-23 LAB — COMPREHENSIVE METABOLIC PANEL
ALT: 27 U/L (ref 0–44)
AST: 26 U/L (ref 15–41)
Albumin: 4 g/dL (ref 3.5–5.0)
Alkaline Phosphatase: 39 U/L (ref 38–126)
Anion gap: 6 (ref 5–15)
BUN: 22 mg/dL — ABNORMAL HIGH (ref 6–20)
CO2: 27 mmol/L (ref 22–32)
Calcium: 9.2 mg/dL (ref 8.9–10.3)
Chloride: 104 mmol/L (ref 98–111)
Creatinine, Ser: 0.96 mg/dL (ref 0.61–1.24)
GFR, Estimated: >60 mL/min (ref >60)
Glucose, Bld: 99 mg/dL (ref 70–99)
Potassium: 4.1 mmol/L (ref 3.5–5.1)
Sodium: 137 mmol/L (ref 135–145)
Total Bilirubin: 0.6 mg/dL (ref 0.3–1.2)
Total Protein: 6.5 g/dL (ref 6.5–8.1)

## 2022-03-23 LAB — CBC
HCT: 44.3 % (ref 39.0–52.0)
Hemoglobin: 14.9 g/dL (ref 13.0–17.0)
MCH: 31.8 pg (ref 26.0–34.0)
MCHC: 33.6 g/dL (ref 30.0–36.0)
MCV: 94.5 fL (ref 80.0–100.0)
Platelets: 237 10*3/uL (ref 150–400)
RBC: 4.69 MIL/uL (ref 4.22–5.81)
RDW: 12.7 % (ref 11.5–15.5)
WBC: 6.2 10*3/uL (ref 4.0–10.5)
nRBC: 0 % (ref 0.0–0.2)

## 2022-03-23 LAB — C-REACTIVE PROTEIN: CRP: 1.8 mg/dL — ABNORMAL HIGH (ref <1.0)

## 2022-03-23 LAB — CBG MONITORING, ED: Glucose-Capillary: 105 mg/dL — ABNORMAL HIGH (ref 70–99)

## 2022-03-23 LAB — LIPASE, BLOOD: Lipase: 29 U/L (ref 11–51)

## 2022-03-23 LAB — TROPONIN I (HIGH SENSITIVITY)
Troponin I (High Sensitivity): 2 ng/L (ref <18)
Troponin I (High Sensitivity): 2 ng/L (ref <18)

## 2022-03-23 LAB — SEDIMENTATION RATE: Sed Rate: 7 mm/hr (ref 0–16)

## 2022-03-23 MED ORDER — ASPIRIN 81 MG PO CHEW
324.0000 mg | CHEWABLE_TABLET | Freq: Once | ORAL | Status: AC
  Administered 2022-03-23: 324 mg via ORAL
  Filled 2022-03-23: qty 4

## 2022-03-23 MED ORDER — NITROGLYCERIN 0.4 MG SL SUBL
0.4000 mg | SUBLINGUAL_TABLET | SUBLINGUAL | Status: AC | PRN
  Administered 2022-03-23 – 2022-03-24 (×3): 0.4 mg via SUBLINGUAL
  Filled 2022-03-23 (×2): qty 1

## 2022-03-23 MED ORDER — KETOROLAC TROMETHAMINE 30 MG/ML IJ SOLN
15.0000 mg | Freq: Once | INTRAMUSCULAR | Status: AC
  Administered 2022-03-23: 15 mg via INTRAVENOUS
  Filled 2022-03-23: qty 1

## 2022-03-23 MED ORDER — ENOXAPARIN SODIUM 40 MG/0.4ML IJ SOSY
40.0000 mg | PREFILLED_SYRINGE | INTRAMUSCULAR | Status: DC
  Administered 2022-03-23 – 2022-03-25 (×3): 40 mg via SUBCUTANEOUS
  Filled 2022-03-23 (×3): qty 0.4

## 2022-03-23 MED ORDER — PANTOPRAZOLE SODIUM 40 MG PO TBEC
40.0000 mg | DELAYED_RELEASE_TABLET | Freq: Every day | ORAL | Status: DC
  Administered 2022-03-24 – 2022-03-26 (×3): 40 mg via ORAL
  Filled 2022-03-23 (×3): qty 1

## 2022-03-23 MED ORDER — ASPIRIN 81 MG PO TBEC
81.0000 mg | DELAYED_RELEASE_TABLET | Freq: Every day | ORAL | Status: DC
  Administered 2022-03-24 – 2022-03-26 (×3): 81 mg via ORAL
  Filled 2022-03-23 (×3): qty 1

## 2022-03-23 NOTE — ED Provider Notes (Signed)
[facility] EMERGENCY DEPARTMENT Provider Note   CSN: [id number] Arrival date & time: 03/23/22  1355     History  Chief Complaint  Patient presents with   Chest Pain    Logan Benton is a 41 y.o. male with a history of hypertension presenting for evaluation of chest pain and shortness of breath.  He is an active runner, stating he runs about 3 times a week.  He was about 3 miles into a run/walk workout yesterday when he developed left-sided chest pain described as pressure which radiated up into his neck in association with tachycardia and shortness of breath.  He sat on the bench to try and recuperate and had a near syncopal episode.  His symptoms lasted for more than an hour and then resolved.  He came to the ED last evening but left prior to being seen.  He was relatively symptom-free overnight then when he woke this morning he had a brief episode of palpitations which have since resolved but then developed left-sided chest pain again with radiation into his neck which has been persistent.  He denies nausea or vomiting, diaphoresis, lightheadedness or near syncope with today's event.  He has had no treatment prior to arrival.  No family history of premature cardiac disease.  He does endorse acid reflux which he treats with omeprazole.  He does not smoke, denies substance abuse.  The history is provided by the patient.       Home Medications Prior to Admission medications   Medication Sig Start Date End Date Taking? Authorizing Provider  Ascorbic Acid (VITAMIN C PO) Take 1 tablet by mouth daily.    [provider]  lisinopril (ZESTRIL) 5 MG tablet Take 1 tablet (5 mg total) by mouth at bedtime. 01/17/22   Lindell Spar, MD  Multiple Vitamin (MULTIVITAMIN WITH MINERALS) TABS tablet Take 1 tablet by mouth daily.    [provider]  omeprazole (PRILOSEC) 20 MG capsule Take 1 capsule (20 mg total) by mouth every morning. 03/06/22   Lindell Spar, MD  Probiotic Product  (PROBIOTIC-10 PO) Take 1 capsule by mouth daily.    [provider]      Allergies    Patient has no known allergies.    Review of Systems   Review of Systems  Constitutional:  Negative for chills and fever.  HENT:  Negative for congestion and sore throat.   Eyes: Negative.   Respiratory:  Negative for chest tightness and shortness of breath.   Cardiovascular:  Positive for chest pain and palpitations.  Gastrointestinal:  Negative for abdominal pain and nausea.  Genitourinary: Negative.   Musculoskeletal:  Negative for arthralgias, joint swelling and neck pain.  Skin: Negative.  Negative for rash and wound.  Neurological:  Positive for syncope. Negative for dizziness, weakness, light-headedness, numbness and headaches.  Psychiatric/Behavioral: Negative.      Physical Exam Updated Vital Signs BP 113/62   Pulse (!) 57   Temp 98.8 F (37.1 C) (Oral)   Resp 16   Ht '5\' 11"'$  (1.803 m)   Wt 68 kg   SpO2 97%   BMI 20.91 kg/m  Physical Exam Vitals and nursing note reviewed.  Constitutional:      Appearance: He is well-developed.  HENT:     Head: Normocephalic and atraumatic.  Eyes:     Conjunctiva/sclera: Conjunctivae normal.  Cardiovascular:     Rate and Rhythm: Normal rate and regular rhythm.     Heart sounds: Normal heart sounds.  Pulmonary:     Effort: Pulmonary effort is normal.     Breath sounds: Normal breath sounds. No wheezing.  Abdominal:     General: Bowel sounds are normal.     Palpations: Abdomen is soft.     Tenderness: There is no abdominal tenderness.  Musculoskeletal:        General: Normal range of motion.     Cervical back: Normal range of motion.  Skin:    General: Skin is warm and dry.  Neurological:     Mental Status: He is alert.    ED Results / Procedures / Treatments   Labs (all labs ordered are listed, but only abnormal results are displayed) Labs Reviewed  COMPREHENSIVE METABOLIC PANEL - Abnormal; Notable for the following  components:      Result Value   BUN 22 (*)    All other components within normal limits  CBG MONITORING, ED - Abnormal; Notable for the following components:   Glucose-Capillary 105 (*)    All other components within normal limits  CBC  LIPASE, BLOOD  TROPONIN I (HIGH SENSITIVITY)  TROPONIN I (HIGH SENSITIVITY)    EKG EKG Interpretation  Date/Time:  Sunday March 23 2022 14:07:36 EDT Ventricular Rate:  76 PR Interval:  122 QRS Duration: 96 QT Interval:  368 QTC Calculation: 414 R Axis:   66 Text Interpretation: Normal sinus rhythm with sinus arrhythmia Brugada pattern, type 1 Abnormal ECG When compared with ECG of 22-Mar-2022 18:53, No significant change was found No significant change since last tracing Confirmed by Isla Pence [phone]) on 03/23/2022 2:53:13 PM  Radiology DG Chest Portable 1 View  Result Date: 03/23/2022 CLINICAL DATA:  Chest pain EXAM: PORTABLE CHEST 1 VIEW COMPARISON:  Chest x-ray dated March 22, 2022 FINDINGS: The heart size and mediastinal contours are within normal limits. Both lungs are clear. The visualized skeletal structures are unremarkable. IMPRESSION: No active disease. Electronically Signed   By: Yetta Glassman M.D.   On: 03/23/2022 15:33   DG Chest 2 View  Result Date: 03/22/2022 CLINICAL DATA:  chest pain EXAM: CHEST - 2 VIEW COMPARISON:  Chest x-ray 08/26/2021 FINDINGS: The heart and mediastinal contours are within normal limits. Likely nipple shadow overlying the lower left lobe. No focal consolidation. No pulmonary edema. No pleural effusion. No pneumothorax. No acute osseous abnormality. IMPRESSION: 1. No active cardiopulmonary disease. 2. Likely nipple shadow overlying the lower left lobe. Consider repeat frontal view with nipple markers. Electronically Signed   By: Iven Finn M.D.   On: 03/22/2022 19:45    Procedures Procedures    Medications Ordered in ED Medications  nitroGLYCERIN (NITROSTAT) SL tablet 0.4 mg (0.4 mg Sublingual  Given 03/23/22 1526)  aspirin chewable tablet 324 mg (324 mg Oral Given 03/23/22 1526)    ED Course/ Medical Decision Making/ A&P Clinical Course as of 03/23/22 1720  Sun Mar 23, 2022  1703 Patient is chest pain-free after receiving second troponin. [JI]    Clinical Course User Index [JI] Landis Martins                           Medical Decision Making Patient presenting with intermittent episodes of left-sided chest pain and pressure with radiation into the neck in association with episodes of palpitations.  He has had a negative delta troponin, his EKG has subtle changes suggesting possible pericarditis.  He has responded to nitroglycerin, pain-free after second tablet given.  Chest x-ray is clear.  Other labs are unremarkable.  Differential diagnoses include unstable angina, acute pericarditis, with negative troponins also consideration for esophageal spasm given response to nitroglycerin.  Amount and/or Complexity of Data Reviewed Labs: ordered.    Details: Chest x-ray is clear.  Reviewed, delta troponins are negative. Radiology: ordered. Discussion of management or test interpretation with external provider(s): Discussed with Dr. Alfred Levins of cardiology.  He suggested obtaining a CRP and a sed rate.  If these tests are negative patient can be discharged home, if positive would consider medical admission with plans for cardiology consult in the a.m. and to consider echocardiogram.  He states that he has had a completely negative coronary CTA with a calcium score of 0, ischemia unlikely.  Risk OTC drugs. Prescription drug management.           Final Clinical Impression(s) / ED Diagnoses Final diagnoses:  None    Rx / DC Orders ED Discharge Orders     None         Landis Martins 03/23/22 Elayne Snare, MD 04/05/22 [phone]

## 2022-03-23 NOTE — ED Triage Notes (Addendum)
Pt was running track yesterday in the heat. At the end of exercise pt started having chest pain, ShOB. Pt sat down and then had a syncopal episode. Pt checked in last night here, but LWBS. Pt states he noticed he had a brief period of racing heart rate this AM. L sided CP at the moment with radiation into his neck.

## 2022-03-23 NOTE — ED Notes (Signed)
1526 Prior to first Nitro pain rated 5/10, BP 119/77 1531 5 mins after first nitro pain 3/10 BP 110/67 1536 after 2nd nitro pain 0/10 BP 116/63

## 2022-03-23 NOTE — ED Provider Notes (Signed)
   Patient signed out to me by Evalee Jefferson, PA-C to arrange disposition pending review of sed rate and CRP.  Patient here with chest pain with syncopal event that occurred yesterday while running.  Patient left here last evening without being seen return today due to recurrence of his chest pain  See prior provider note for complete H&P  Cardiology was consulted for EKG changes and possible pericarditis.  Patient was recommended to have CRP and sed rate.  Sed rate is reassuring at 7 CRP is still pending and unfortunately is a send out test that will not result until tomorrow.  I have consulted cardiology Dr. Alfred Levins again to inform him about the pending CRP, patient is continuing to have some mild left-sided chest pain on my exam although improved from ER arrival, cards  recommending hospital admission here, cards consult and echocardiogram in the morning.  Discussed findings with [facility] hospitalist, Dr. Josephine Cables who agrees to admit    Kem Parkinson, PA-C 03/23/22 2034    Fredia Sorrow, MD 04/05/22 [phone]

## 2022-03-23 NOTE — ED Notes (Signed)
Pt sitting in bed denies any needs states he feels much better and hopes to go home.

## 2022-03-23 NOTE — H&P (Addendum)
History and Physical    Patient: Logan Benton HYW:[id number] DOB: Oct 12, 1980 DOA: 03/23/2022 DOS: the patient was seen and examined on 03/23/2022 PCP: Lindell Spar, MD  Patient coming from: Home  Chief Complaint:  Chief Complaint  Patient presents with   Chest Pain   HPI: Logan Benton is a 41 y.o. male with medical history significant of hypertension, GERD and Barrett's esophagus who presents to the emergency department due to 1 day onset of chest pain and shortness of breath.  Patient exercises regularly by running about 3 times per week.  Yesterday, patient started to notice left-sided chest pain after running about 3 miles, chest pain was described as sharp squeezing-like with radiation to neck, left shoulder and arm, it was nonreproducible and was associated with shortness of breath, palpitations and diaphoresis.  He sat down to recuperate and almost passed out, symptoms subsided after more than 1 hour.  On returning home, chest pain continued, so, he presented to the ED last evening, but left prior to being seen due to long wait time.  Patient was chest pain-free overnight, but experienced a brief episode of palpitations which have since resolved on waking up this morning.  However, chest pain similar to yesterday's with radiation to the neck, left shoulder and arm reoccurred along with sweating.chest pain was rated as 7-8/10 on pain scale, it was aggravated with deep breath and exertion.  He decided to go to the ED for further evaluation and management.  Patient denies fever, chills, blurry vision, nausea, vomiting, lightheadedness.  He states that he has not been feeling so well since onset of this year and endorsed to several GI work-up without any specific diagnosis yet, he states he was recently treated for oral oropharyngeal candidiasis with nystatin but denies family history of heart disease and denies use of tobacco, alcohol or any recreational drug.  ED Course:  In the  emergency department, he was bradycardic, but other vital signs were within normal range.  Work-up in the ED showed normal CBC and BMP except for BUN of 22, troponin x2 done yesterday and today were negative.  Lipase 29, ESR 7, CRP 1.8 Aspirin 324 mg x 1 and IV Toradol 50 mg x 1 was given, nitroglycerin was given with improvement in chest pain. Cardiologist (Dr. Alfred Levins) was consulted and recommended ESR and CRP as reported above and also recommended admitting patient here at [facility] with plan to be seen in the morning by a cardiologist.  Hospitalist was asked to admit patient for further evaluation and management.  Review of Systems: Review of systems as noted in the HPI. All other systems reviewed and are negative.   Past Medical History:  Diagnosis Date   Abscessed tooth    Anxiety    C. difficile diarrhea    presumptive diagnosis   Depression    Hypertension    Trigger finger    Past Surgical History:  Procedure Laterality Date   BILATERAL CARPAL TUNNEL RELEASE     BIOPSY  06/29/2020   Procedure: BIOPSY;  Surgeon: Eloise Harman, DO;  Location: [facility];  Service: Endoscopy;;   BIOPSY  09/03/2020   Procedure: BIOPSY;  Surgeon: Eloise Harman, DO;  Location: [facility];  Service: Endoscopy;;   BIOPSY  03/08/2021   Procedure: BIOPSY;  Surgeon: Eloise Harman, DO;  Location: [facility];  Service: Endoscopy;;   BIOPSY  05/10/2021   Procedure: BIOPSY;  Surgeon: Eloise Harman, DO;  Location: [facility];  Service: Endoscopy;;   COLONOSCOPY WITH PROPOFOL N/A 06/29/2020   Dr. Abbey Chatters: 4 mm tubular adenoma removed, nonbleeding internal hemorrhoids.  Repeat colonoscopy in 7 years.   COLONOSCOPY WITH PROPOFOL N/A 05/10/2021   Procedure: COLONOSCOPY WITH PROPOFOL;  Surgeon: Eloise Harman, DO;  Location: [facility];  Service: Endoscopy;  Laterality: N/A;  1:15pm   ESOPHAGOGASTRODUODENOSCOPY (EGD) WITH PROPOFOL N/A 06/29/2020   Dr. Abbey Chatters: LA grade C esophagitis, gastritis with  mild chronic gastritis on path, small bowel biopsy unremarkable.   ESOPHAGOGASTRODUODENOSCOPY (EGD) WITH PROPOFOL N/A 09/03/2020   Dr. Abbey Chatters: Esophageal mucosal changes suggestive of short segment Barrett's esophagus, biopsies consistent.  Next EGD in 3 years   ESOPHAGOGASTRODUODENOSCOPY (EGD) WITH PROPOFOL N/A 03/08/2021   Procedure: ESOPHAGOGASTRODUODENOSCOPY (EGD) WITH PROPOFOL;  Surgeon: Eloise Harman, DO;  Location: [facility];  Service: Endoscopy;  Laterality: N/A;  1:00pm   POLYPECTOMY  06/29/2020   Procedure: POLYPECTOMY;  Surgeon: Eloise Harman, DO;  Location: [facility];  Service: Endoscopy;;   SHOULDER SURGERY Right    x2   TRIGGER FINGER RELEASE Right 2016    Social History:  reports that he quit smoking about 2 years ago. His smoking use included cigarettes. He has been exposed to tobacco smoke. He has never used smokeless tobacco. He reports current drug use. Drug: Marijuana. He reports that he does not drink alcohol.   No Known Allergies  Family History  Problem Relation Age of Onset   Rheum arthritis Mother    Bladder Cancer Mother    Kidney disease Father    Bladder Cancer Maternal Grandfather    Irritable bowel syndrome Paternal Grandmother    Colon cancer Neg Hx    Celiac disease Neg Hx    Inflammatory bowel disease Neg Hx      Prior to Admission medications   Medication Sig Start Date End Date Taking? Authorizing Provider  Ascorbic Acid (VITAMIN C PO) Take 1 tablet by mouth daily.   Yes [provider]  lisinopril (ZESTRIL) 5 MG tablet Take 1 tablet (5 mg total) by mouth at bedtime. 01/17/22  Yes Lindell Spar, MD  Multiple Vitamin (MULTIVITAMIN WITH MINERALS) TABS tablet Take 1 tablet by mouth daily.   Yes [provider]  nystatin (MYCOSTATIN) 100000 UNIT/ML suspension  02/17/22  Yes [provider]  omeprazole (PRILOSEC) 20 MG capsule Take 1 capsule (20 mg total) by mouth every morning. 03/06/22  Yes Lindell Spar, MD   Probiotic Product (PROBIOTIC-10 PO) Take 1 capsule by mouth daily.   Yes [provider]    Physical Exam: BP 113/83 (BP Location: Left Arm)   Pulse (!) 51   Temp 97.9 F (36.6 C) (Oral)   Resp 18   Ht 5' 11" (1.803 m)   Wt 68 kg   SpO2 100%   BMI 20.91 kg/m   General: 41 y.o. year-old male well developed well nourished in no acute distress.  Alert and oriented x3. HEENT: NCAT, EOMI Neck: Supple, trachea medial Cardiovascular: Bradycardia.  Regular rate and rhythm with no rubs or gallops.  No thyromegaly or JVD noted.  No lower extremity edema. 2/4 pulses in all 4 extremities. Respiratory: Clear to auscultation with no wheezes or rales. Good inspiratory effort. Abdomen: Soft, nontender nondistended with normal bowel sounds x4 quadrants. Muskuloskeletal: No cyanosis, clubbing or edema noted bilaterally Neuro: CN II-XII intact, strength 5/5 x 4, sensation, reflexes intact Skin: No ulcerative lesions noted or rashes Psychiatry: Judgement and insight appear  normal. Mood is appropriate for condition and setting          Labs on Admission:  Basic Metabolic Panel: Recent Labs  Lab 03/22/22 1907 03/23/22 1519  NA 135 137  K 3.5 4.1  CL 104 104  CO2 23 27  GLUCOSE 118* 99  BUN 22* 22*  CREATININE 0.98 0.96  CALCIUM 9.6 9.2   Liver Function Tests: Recent Labs  Lab 03/23/22 1519  AST 26  ALT 27  ALKPHOS 39  BILITOT 0.6  PROT 6.5  ALBUMIN 4.0   Recent Labs  Lab 03/23/22 1519  LIPASE 29   No results for input(s): "AMMONIA" in the last 168 hours. CBC: Recent Labs  Lab 03/22/22 1907 03/23/22 1519  WBC 8.8 6.2  HGB 15.1 14.9  HCT 43.7 44.3  MCV 92.6 94.5  PLT 244 237   Cardiac Enzymes: No results for input(s): "CKTOTAL", "CKMB", "CKMBINDEX", "TROPONINI" in the last 168 hours.  BNP (last 3 results) No results for input(s): "BNP" in the last 8760 hours.  ProBNP (last 3 results) No results for input(s): "PROBNP" in the last 8760  hours.  CBG: Recent Labs  Lab 03/23/22 1517  GLUCAP 105*    Radiological Exams on Admission: DG Chest Portable 1 View  Result Date: 03/23/2022 CLINICAL DATA:  Chest pain EXAM: PORTABLE CHEST 1 VIEW COMPARISON:  Chest x-ray dated March 22, 2022 FINDINGS: The heart size and mediastinal contours are within normal limits. Both lungs are clear. The visualized skeletal structures are unremarkable. IMPRESSION: No active disease. Electronically Signed   By: Yetta Glassman M.D.   On: 03/23/2022 15:33   DG Chest 2 View  Result Date: 03/22/2022 CLINICAL DATA:  chest pain EXAM: CHEST - 2 VIEW COMPARISON:  Chest x-ray 08/26/2021 FINDINGS: The heart and mediastinal contours are within normal limits. Likely nipple shadow overlying the lower left lobe. No focal consolidation. No pulmonary edema. No pleural effusion. No pneumothorax. No acute osseous abnormality. IMPRESSION: 1. No active cardiopulmonary disease. 2. Likely nipple shadow overlying the lower left lobe. Consider repeat frontal view with nipple markers. Electronically Signed   By: Iven Finn M.D.   On: 03/22/2022 19:45    EKG: I independently viewed the EKG done and my findings are as followed:  EKG done yesterday (03/22/2022) showed normal sinus rhythm at a rate of 98 bpm which showed no significant change from EKG done on 08/26/2021 EKG done today showed normal sinus rhythm with sinus arrhythmia at a rate of 76 bpm   Assessment/Plan Present on Admission:  Chest pain  Barrett's esophagus  Primary hypertension  Principal Problem:   Chest pain Active Problems:   Barrett's esophagus   Primary hypertension   Near syncope  Chest pain rule out ACS Continue telemetry  Troponins x2 was flat at 2 EKG personally reviewed showed normal sinus rhythm with sinus arrhythmia at a rate of 76 bpm Echo Doppler color-flow velocity map done on 05/14/2019 showed: Normal left ventricular systolic function ejection fraction of 60 to 65%, normal right  ventricular systolic function, no significant valvular abnormalities. Exercise tolerance test done on 11/25/2019 showed: The patient exercised following the Bruce protocol. The patient reported dyspnea during the stress test. The patient experienced no angina during the stress test.  Test was stopped per protocol.   Blood pressure and heart rate demonstrated a normal response to exercise. Blood pressure demonstrated a normal response to exercise. Overall, the patient's exercise capacity was normal.  85% of maximum heart rate was achieved after 10.2  minutes.  Recovery time:  6.5 minutes.   D[facility] Treadmill Score: low risk The patient's response to exercise was adequate for diagnosis. Cardiology will be consulted to help decide if Stress test is needed in am Versus other  diagnostic modalities.    Give aspirin, nitroglycerin prn  Near Syncopal episode Continue telemetry and watch for arrhythmias Troponins x2 was flat at 2  Echocardiogram will be done to rule out significant aortic stenosis or other outflow obstruction, and also to evaluate EF and to rule out segmental/Regional wall motion abnormalities.  Carotid artery Dopplers will be done to rule out hemodynamically significant stenosis  Essential hypertension Home lisinopril will be held at this time due to soft BP  Barrett's esophagus Continue Protonix  DVT prophylaxis: Lovenox  Code Status: Full code  Consults: Cardiology  Family Communication: None at bedside  Severity of Illness: The appropriate patient status for this patient is OBSERVATION. Observation status is judged to be reasonable and necessary in order to provide the required intensity of service to ensure the patient's safety. The patient's presenting symptoms, physical exam findings, and initial radiographic and laboratory data in the context of their medical condition is felt to place them at decreased risk for further clinical deterioration. Furthermore, it is  anticipated that the patient will be medically stable for discharge from the hospital within 2 midnights of admission.   Author: Bernadette Hoit, DO 03/23/2022 9:53 PM  For on call review [email or web address].

## 2022-03-24 ENCOUNTER — Encounter (HOSPITAL_COMMUNITY): Payer: Self-pay | Admitting: Internal Medicine

## 2022-03-24 ENCOUNTER — Observation Stay (HOSPITAL_COMMUNITY): Payer: Self-pay

## 2022-03-24 DIAGNOSIS — I498 Other specified cardiac arrhythmias: Secondary | ICD-10-CM

## 2022-03-24 DIAGNOSIS — R079 Chest pain, unspecified: Secondary | ICD-10-CM

## 2022-03-24 DIAGNOSIS — R002 Palpitations: Secondary | ICD-10-CM

## 2022-03-24 LAB — MAGNESIUM: Magnesium: 2.2 mg/dL (ref 1.7–2.4)

## 2022-03-24 LAB — COMPREHENSIVE METABOLIC PANEL
ALT: 26 U/L (ref 0–44)
AST: 23 U/L (ref 15–41)
Albumin: 4 g/dL (ref 3.5–5.0)
Alkaline Phosphatase: 37 U/L — ABNORMAL LOW (ref 38–126)
Anion gap: 5 (ref 5–15)
BUN: 23 mg/dL — ABNORMAL HIGH (ref 6–20)
CO2: 28 mmol/L (ref 22–32)
Calcium: 9.3 mg/dL (ref 8.9–10.3)
Chloride: 106 mmol/L (ref 98–111)
Creatinine, Ser: 0.98 mg/dL (ref 0.61–1.24)
GFR, Estimated: >60 mL/min (ref >60)
Glucose, Bld: 99 mg/dL (ref 70–99)
Potassium: 4.3 mmol/L (ref 3.5–5.1)
Sodium: 139 mmol/L (ref 135–145)
Total Bilirubin: 0.5 mg/dL (ref 0.3–1.2)
Total Protein: 6.4 g/dL — ABNORMAL LOW (ref 6.5–8.1)

## 2022-03-24 LAB — CBC
HCT: 47.5 % (ref 39.0–52.0)
Hemoglobin: 15.4 g/dL (ref 13.0–17.0)
MCH: 31.4 pg (ref 26.0–34.0)
MCHC: 32.4 g/dL (ref 30.0–36.0)
MCV: 96.9 fL (ref 80.0–100.0)
Platelets: 229 10*3/uL (ref 150–400)
RBC: 4.9 MIL/uL (ref 4.22–5.81)
RDW: 12.6 % (ref 11.5–15.5)
WBC: 6.3 10*3/uL (ref 4.0–10.5)
nRBC: 0 % (ref 0.0–0.2)

## 2022-03-24 LAB — ECHOCARDIOGRAM COMPLETE
AR max vel: 3.23 cm2
AV Area VTI: 2.99 cm2
AV Area mean vel: 2.96 cm2
AV Mean grad: 2 mmHg
AV Peak grad: 4.2 mmHg
Ao pk vel: 1.02 m/s
Area-P 1/2: 2.8 cm2
Calc EF: 69.2 %
Height: 71 in
MV VTI: 2.19 cm2
S' Lateral: 2.8 cm
Single Plane A2C EF: 66.7 %
Single Plane A4C EF: 69.5 %
Weight: 2398.6 oz

## 2022-03-24 LAB — PHOSPHORUS: Phosphorus: 4.2 mg/dL (ref 2.5–4.6)

## 2022-03-24 LAB — APTT: aPTT: 30 seconds (ref 24–36)

## 2022-03-24 MED ORDER — ACETAMINOPHEN 325 MG PO TABS
650.0000 mg | ORAL_TABLET | Freq: Four times a day (QID) | ORAL | Status: DC | PRN
  Administered 2022-03-24: 650 mg via ORAL
  Filled 2022-03-24: qty 2

## 2022-03-24 MED ORDER — LISINOPRIL 10 MG PO TABS
10.0000 mg | ORAL_TABLET | Freq: Every day | ORAL | Status: DC
  Administered 2022-03-24 – 2022-03-26 (×3): 10 mg via ORAL
  Filled 2022-03-24 (×3): qty 1

## 2022-03-24 NOTE — Progress Notes (Signed)
*  PRELIMINARY RESULTS* Echocardiogram 2D Echocardiogram has been performed.  Logan Benton 03/24/2022, 9:09 AM

## 2022-03-24 NOTE — TOC Initial Note (Signed)
  Transition of Care (TOC) Screening Note   Patient Details  Name: Logan Benton Date of Birth: 1981-08-19   Transition of Care [facility]) CM/SW Contact:    Boneta Lucks, RN Phone Number: 03/24/2022, 1:42 PM    Transition of Care Department [facility]) has reviewed patient and no TOC needs have been identified at this time. We will continue to monitor patient advancement through interdisciplinary progression rounds. If new patient transition needs arise, please place a TOC consult.   Expected Discharge Plan: Home/Self Care  Continuing medical workup

## 2022-03-24 NOTE — Consult Note (Addendum)
Cardiology Consultation:   Patient ID: Logan Benton MRN: [id number]; DOB: 06/13/1981  Admit date: 03/23/2022 Date of Consult: 03/24/2022  PCP:  Lindell Spar, MD   [facility] Providers Cardiologist:  Jenkins Rouge, MD        Patient Profile:   Logan Benton is a 41 y.o. male with a hx of chest pain with normal cardiac CTA, Barrett's esophagitis, HTN, hx of tobacco use  who is being seen 03/24/2022 for the evaluation of chest pain and syncope at the request of  Dr. Denton Brick.  History of Present Illness:   Logan Benton with hx as above and last year was seen for chest pain atypical and with GI issues thrush/esophagitis -underwent cardiac CTA and neg for CAD and 0 ca+ score.   Now presents 03/22/22 with chest pain lt side with radiation into neck, rapid HR after running along with SOB + nausea.  He sat on bench and had syncopal episode.  Symptoms lasted for >1 hr.  He went home was able to sleep but had recurrent Lt chest pain with radiation into neck. No associated symptoms.  Does take omeprazole for GERD.   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 difficile diarrhea    presumptive diagnosis   Depression    Hypertension    Trigger finger     Past Surgical History:  Procedure Laterality Date   BILATERAL CARPAL TUNNEL RELEASE     BIOPSY  06/29/2020   Procedure: BIOPSY;  Surgeon: Eloise Harman, DO;  [facility]: [facility];  Service: Endoscopy;;   BIOPSY  09/03/2020   Procedure: BIOPSY;  Surgeon: Eloise Harman, DO;  Location: [facility];  Service: Endoscopy;;   BIOPSY  03/08/2021   Procedure: BIOPSY;  Surgeon: Eloise Harman, DO;  [facility]: [facility];  Service: Endoscopy;;   BIOPSY  05/10/2021   Procedure: BIOPSY;  Surgeon: Eloise Harman, DO;  Location: [facility];  Service: Endoscopy;;   COLONOSCOPY WITH PROPOFOL N/A 06/29/2020   Dr. Abbey Chatters: 4 mm tubular adenoma removed, nonbleeding internal hemorrhoids.  Repeat colonoscopy in 7 years.   COLONOSCOPY WITH PROPOFOL N/A 05/10/2021   Procedure: COLONOSCOPY WITH PROPOFOL;  Surgeon: Eloise Harman, DO;  Location: [facility];  Service: Endoscopy;  Laterality: N/A;  1:15pm   ESOPHAGOGASTRODUODENOSCOPY (EGD) WITH PROPOFOL N/A 06/29/2020   Dr. Abbey Chatters: LA grade C esophagitis, gastritis with mild chronic gastritis on path, small bowel biopsy unremarkable.   ESOPHAGOGASTRODUODENOSCOPY (EGD) WITH PROPOFOL N/A 09/03/2020   Dr. Abbey Chatters: Esophageal mucosal changes suggestive of short segment Barrett's esophagus, biopsies consistent.  Next EGD in 3 years   ESOPHAGOGASTRODUODENOSCOPY (EGD) WITH PROPOFOL N/A 03/08/2021   Procedure: ESOPHAGOGASTRODUODENOSCOPY (EGD) WITH PROPOFOL;  Surgeon: Eloise Harman, DO;  [facility];  Service: Endoscopy;  Laterality: N/A;  1:00pm   POLYPECTOMY  06/29/2020   Procedure: POLYPECTOMY;  Surgeon: Eloise Harman, DO;  Location: [facility];  Service: Endoscopy;;   SHOULDER SURGERY Right    x2   TRIGGER FINGER RELEASE Right 2016     Home Medications:  Prior to Admission medications   Medication Sig Start Date End Date Taking? Authorizing Provider  Ascorbic Acid (VITAMIN C PO) Take 1 tablet by mouth daily.   Yes [provider]  lisinopril (ZESTRIL) 5 MG tablet Take 1 tablet (5 mg total) by mouth at bedtime. 01/17/22  Yes Lindell Spar, MD  Multiple Vitamin (MULTIVITAMIN WITH MINERALS) TABS  tablet Take 1 tablet by mouth daily.   Yes [provider]  nystatin (MYCOSTATIN) 100000 UNIT/ML suspension  02/17/22  Yes [provider]  omeprazole (PRILOSEC) 20 MG capsule Take 1 capsule (20 mg total) by mouth every morning. 03/06/22  Yes Lindell Spar, MD  Probiotic Product (PROBIOTIC-10 PO) Take 1 capsule by mouth daily.   Yes [provider]    Inpatient Medications: Scheduled Meds:  aspirin EC  81 mg Oral Daily   enoxaparin (LOVENOX) injection  40 mg Subcutaneous Q24H   pantoprazole  40 mg Oral Daily   Continuous Infusions:  PRN Meds: nitroGLYCERIN  Allergies:   No Known Allergies  Social History:   Social History   Socioeconomic History   Marital status: Divorced    Spouse name: Not on file   Number of children: Not on file   Years of education: Not on file   Highest education level: Not on file  Occupational History   Not on file  Tobacco Use   Smoking status: Former    Packs/day: 0.00    Years: 0.00    Total pack years: 0.00    Types: Cigarettes    Quit date: 11/03/2019    Years since quitting: 2.3    Passive exposure: Current   Smokeless tobacco: Never   Tobacco comments:    nicoten gum  Vaping Use   Vaping Use: Former   Quit date: 03/07/2019  Substance and Sexual Activity   Alcohol use: No   Drug use: Yes    Types: Marijuana   Sexual activity: Not Currently    Partners: Female  Other Topics Concern   Not on file  Social History Narrative   Not on file   Social Determinants of Health   Financial Resource Strain: Not on file  Food Insecurity: Not on file  Transportation Needs: Not on file  Physical Activity: Not on file  Stress: Not on file  Social Connections: Not on file  Intimate Partner Violence: Not on file    Family History:    Family History  Problem Relation Age of Onset   Rheum arthritis Mother    Bladder Cancer Mother    Kidney disease Father    Bladder Cancer Maternal Grandfather    Irritable bowel  syndrome Paternal Grandmother    Colon cancer Neg Hx    Celiac disease Neg Hx    Inflammatory bowel disease Neg Hx      ROS:  Please see the history of present illness.  General:no colds or fevers, no weight changes Skin:no rashes or ulcers HEENT:no blurred vision, no congestion CV:see HPI PUL:see HPI GI:no diarrhea constipation or melena, no indigestion GU:no hematuria, no dysuria MS:no joint pain, no claudication Neuro:no syncope, no lightheadedness Endo:no diabetes, no thyroid disease Psych:  hx anxiety  All other ROS reviewed and negative.  Physical Exam/Data:   Vitals:   03/23/22 2045 03/23/22 2131 03/23/22 2139 03/24/22 0544  BP: 116/70  113/83 113/68  Pulse: (!) 57  (!) 51 (!) 59  Resp: '17  18 19  '$ Temp:   97.9 F (36.6 C) 98.2 F (36.8 C)  TempSrc:   Oral Oral  SpO2: 98% 100% 100% 100%  Weight:      Height:       No intake or output data in the 24 hours ending 03/24/22 0844    03/23/2022    2:27 PM 03/22/2022    6:52 PM 03/12/2022   11:01 AM  Last 3 Weights  Weight (lbs) 149 lb 14.6 oz 150 lb 158 lb  Weight (kg) 68 kg 68.04 kg 71.668 kg    Exam per Dr. Johney Frame  Body mass index is 20.91 kg/m.  General:  Well nourished, well developed, in no acute distress HEENT: normal Neck: no JVD Vascular: No carotid bruits; Distal pulses 2+ bilaterally Cardiac:  normal S1, S2; RRR; no murmur  Lungs:  clear to auscultation bilaterally, no wheezing, rhonchi or rales  Abd: soft, nontender, no hepatomegaly  Ext: no edema Musculoskeletal:  No deformities, BUE and BLE strength normal and equal Skin: warm and dry  Neuro:  CNs 2-12 intact, no focal abnormalities noted Psych:  Normal affect    Relevant CV Studies: Echo pending   Laboratory Data:  High Sensitivity Troponin:   Recent Labs  Lab 03/22/22 1907 03/23/22 1519 03/23/22 1648  TROPONINIHS '7 2 2     '$ Chemistry Recent Labs  Lab 03/22/22 1907 03/23/22 1519 03/24/22 0441  NA 135 137 139  K 3.5 4.1  4.3  CL 104 104 106  CO2 '23 27 28  '$ GLUCOSE 118* 99 99  BUN 22* 22* 23*  CREATININE 0.98 0.96 0.98  CALCIUM 9.6 9.2 9.3  MG  --   --  2.2  GFRNONAA >60 >60 >60  ANIONGAP '8 6 5    '$ Recent Labs  Lab 03/23/22 1519 03/24/22 0441  PROT 6.5 6.4*  ALBUMIN 4.0 4.0  AST 26 23  ALT 27 26  ALKPHOS 39 37*  BILITOT 0.6 0.5   Lipids No results for input(s): "CHOL", "TRIG", "HDL", "LABVLDL", "LDLCALC", "CHOLHDL" in the last 168 hours.  Hematology Recent Labs  Lab 03/22/22 1907 03/23/22 1519 03/24/22 0441  WBC 8.8 6.2 6.3  RBC 4.72 4.69 4.90  HGB 15.1 14.9 15.4  HCT 43.7 44.3 47.5  MCV 92.6 94.5 96.9  MCH 32.0 31.8 31.4  MCHC 34.6 33.6 32.4  RDW 12.5 12.7 12.6  PLT 244 237 229   Thyroid No results for input(s): "TSH", "FREET4" in the last 168 hours.  BNPNo results for input(s): "BNP", "PROBNP" in the last 168 hours.  DDimer No results for input(s): "DDIMER" in the last 168 hours.   Radiology/Studies:  DG Chest Portable 1 View  Result Date: 03/23/2022 CLINICAL DATA:  Chest pain EXAM: PORTABLE CHEST 1 VIEW COMPARISON:  Chest x-ray dated March 22, 2022 FINDINGS: The heart size and mediastinal contours are within normal limits. Both lungs are clear. The visualized skeletal structures are unremarkable. IMPRESSION: No active disease. Electronically Signed   By: Yetta Glassman M.D.   On: 03/23/2022 15:33   DG Chest 2 View  Result Date: 03/22/2022 CLINICAL DATA:  chest pain EXAM: CHEST - 2 VIEW COMPARISON:  Chest x-ray 08/26/2021 FINDINGS: The heart and mediastinal contours are within normal limits. Likely nipple shadow overlying the lower left lobe. No focal consolidation. No  pulmonary edema. No pleural effusion. No pneumothorax. No acute osseous abnormality. IMPRESSION: 1. No active cardiopulmonary disease. 2. Likely nipple shadow overlying the lower left lobe. Consider repeat frontal view with nipple markers. Electronically Signed   By: Iven Finn M.D.   On: 03/22/2022 19:45      Assessment and Plan:   Chest pain neg MI, hx of normal cardiac CTA 1 year ago.  EKG with chronic incomplete RBBB. EKG with question of Brugada - Dr. Johney Frame to eval.  Echo pending. Possible pericarditis. ? cMRI? To eval Syncope/near syncope felt heart racing.  This occurred after running for exercise which he does 3 X per week. HTN controlled on lisinopril  GERD with recent abd pain on prilosec and to see GI   Risk Assessment/Risk Scores:     HEAR Score (for undifferentiated chest pain):             For questions or updates, please contact [facility] Please consult [email or web address] for contact info under    Signed, Cecilie Kicks, NP  03/24/2022 8:44 AM  Patient seen and examined and agree with Cecilie Kicks, NP as detailed above.  In brief, the patient is a 41 year old with history of HTN, tobacco use and barrett's esophagitis who presented to the ER with palpitations and syncope for which Cardiology has been consulted.  Patient has been previously seen by Cardiology for atypical chest pain with cardiac CTA without obstructive disease and Ca score 0. He was ultimately diagnosed with Barrett's esophagitis.   He had been doing okay until the day of admission where he was out doing a hard run when he suddenly developed acute onset palpitations with associated syncope prompting him to go to the ER. Specifically, the patient states he was doing hard "burst runs" for exercise and after a burst, he felt his heart racing out of his chest. He began to lose his vision and the next thing he knew he had passed out. He had some associated left sided chest pain as well. It took him about an hour to feel better. Patient then experienced a few more episodes of palpitations throughout the day and then had recurrence of his chest pain the next morning prompting hm to come to the ER.  In the ER, ECG showed sinus bradycardia with concern for Brugada type I pattern. Trop negative. K 4.3, Mg 2.2. TTE  per my prelim review with normal BiV function. 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 Discussed with EP. Will plan for exercise treadmill tomorrow to evaluate for exercise induced VT as Dr. Lovena Le will be in-house. If evidence of VT, will need ICD placement.   GEN: No acute distress.   Neck: No JVD Cardiac: RRR, no murmurs, rubs, or gallops.  Respiratory: Clear to auscultation bilaterally. GI: Soft, nontender, non-distended  MS: No edema; No deformity. Neuro:  Nonfocal  Psych: Normal affect     Plan: -Plan for exercise treadmill tomorrow given concern for underlying Brugada type I syndrome and arrhythmogenic etiology of syncope -Dr. Lovena Le will be present tomorrow for further EP evaluation -If evidence of VT, patient will need ICD placement (discussed with him today) -May add BB following treadmill test -Keep K>4 and Mg>2 -Counseled that fever is one of the biggest risks for patient's with Brugada and this will need immediate treatment if Brugada syndrome is present   Gwyndolyn Kaufman, MD

## 2022-03-24 NOTE — Progress Notes (Signed)
PROGRESS NOTE     Alejandro Gamel, is a 41 y.o. male, DOB - 01-10-1981, TIW:[id number]  Admit date - 03/23/2022   Admitting Physician Bernadette Hoit, DO  Outpatient Primary MD for the patient is Lindell Spar, MD  LOS - 0  Chief Complaint  Patient presents with   Chest Pain        Brief Narrative:  41 y.o. male with medical history significant of hypertension, GERD and Barrett's esophagus admitted on 03/23/22 with chest pain    -Assessment and Plan: 1)Atypical Chest pains--ruled out for ACS by cardiac enzymes and EKG -CTA chest with calcium score from August 2022 was unremarkable with a calcium score of Zero (0) and normal right dominant coronary arteries -ETT/EST from 11/25/2019 was unremarkable with excellent exercise tolerance -Echo from 03/24/2022 with EF of 60 to 65%, no wall motion abnormalities and no diastolic dysfunction, -No aortic stenosis -Hold off on beta-blockers until after stress test  2)Near Syncope/palpitations/tachyarrhythmia--there is concern for 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 tach patient will need ICD placement- -Hopefully can be evaluated by Dr. 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 tach*  Disposition: The patient is from: Home              Anticipated d/c is to: Home              Anticipated d/c date is: 1 day              Patient currently is not medically stable to d/c. Barriers: Not Clinically Stable-   Code Status :  -  Code Status: Full Code   Family Communication:    NA (patient is alert, awake and coherent)   DVT Prophylaxis  :   - SCDs   enoxaparin (LOVENOX) injection 40 mg Start: 03/23/22 2230 SCDs Start: 03/23/22 2131   Lab Results  Component Value Date   PLT 229 03/24/2022    Inpatient Medications  Scheduled Meds:  aspirin EC  81 mg Oral Daily   enoxaparin (LOVENOX) injection  40 mg Subcutaneous Q24H   pantoprazole  40 mg Oral Daily   Continuous Infusions: PRN Meds:.acetaminophen   Anti-infectives (From admission, onward)    None        Subjective: Ravindra Enderle today has no fevers, no emesis,   -No dyspnea  Objective: Vitals:   03/23/22 2139 03/24/22 0544 03/24/22 1233 03/24/22 1500  BP: 113/83 113/68 127/89 (!) 132/94  Pulse: (!) 51 (!) 59 67 64  Resp: '18 19 16   '$ Temp: 97.9 F (36.6 C) 98.2 F (36.8 C) 98.4 F (36.9 C)   TempSrc: Oral Oral    SpO2: 100% 100% 100% 99%  Weight:      Height:        Intake/Output Summary (Last 24 hours) at 03/24/2022 1545 Last data filed at 03/24/2022 1300 Gross per 24 hour  Intake 600 ml  Output --  Net 600 ml   Filed Weights   03/23/22 1427  Weight: 68 kg    Physical Exam  Gen:- Awake Alert,  in no apparent distress  HEENT:- [place].AT, No sclera icterus Neck-Supple Neck,No JVD,.  Lungs-  CTAB , fair  symmetrical air movement CV- S1, S2 normal, regular  Abd-  +ve B.Sounds, Abd Soft, No tenderness,    Extremity/Skin:- No  edema, pedal pulses present  Psych-affect is appropriate, oriented x3 Neuro-no new focal deficits, no tremors  Data Reviewed: I have personally reviewed following labs and imaging studies  CBC: Recent Labs  Lab 03/22/22 1907 03/23/22 1519 03/24/22 0441  WBC 8.8 6.2 6.3  HGB 15.1 14.9 15.4  HCT 43.7 44.3 47.5  MCV 92.6 94.5 96.9  PLT 244 237 353   Basic Metabolic Panel: Recent Labs  Lab 03/22/22 1907 03/23/22 1519 03/24/22 0441  NA 135 137 139  K 3.5 4.1 4.3  CL 104 104 106  CO2 '23 27 28  '$ GLUCOSE 118* 99 99  BUN 22* 22* 23*  CREATININE 0.98 0.96 0.98  CALCIUM 9.6 9.2 9.3  MG  --   --  2.2  PHOS  --   --   4.2   GFR: Estimated Creatinine Clearance: 98.3 mL/min (by C-G formula based on SCr of 0.98 mg/dL). Liver Function Tests: Recent Labs  Lab 03/23/22 1519 03/24/22 0441  AST 26 23  ALT 27 26  ALKPHOS 39 37*  BILITOT 0.6 0.5  PROT 6.5 6.4*  ALBUMIN 4.0 4.0   Cardiac Enzymes: No results for input(s): "CKTOTAL", "CKMB", "CKMBINDEX", "TROPONINI" in the last 168 hours. BNP (last 3 results) No results for input(s): "PROBNP" in the last 8760 hours. HbA1C: No results for input(s): "HGBA1C" in the last 72 hours. Sepsis Labs: '@LABRCNTIP'$ (procalcitonin:4,lacticidven:4) )No results found for this or any previous visit (from the past 240 hour(s)).    Radiology Studies: ECHOCARDIOGRAM COMPLETE  Result Date: 03/24/2022    ECHOCARDIOGRAM REPORT   Patient Name:   JAISHON KRISHER Date of Exam: 03/24/2022 Medical Rec #:  [id number]        Height:       71.0 in Accession #:    [id number]       Weight:       149.9 lb Date of Birth:  1981/08/31       BSA:          1.865 m Patient Age:    23 years         BP:           113/68 mmHg Patient Gender: M                HR:           50 bpm. Exam Location:  [facility] Procedure: 2D Echo, Cardiac Doppler and Color Doppler Indications:    Chest Pain  History:        Patient has no prior history of Echocardiogram examinations.                 Signs/Symptoms:Chest Pain; Risk Factors:Hypertension.  Sonographer:    Wenda Low Referring Phys: [id number] OLADAPO ADEFESO IMPRESSIONS  1. Left ventricular ejection fraction, by estimation, is 60 to 65%. The left ventricle has normal function. The left ventricle has no regional wall motion abnormalities. Left ventricular diastolic parameters were normal.  2. Right ventricular systolic function is normal. The right ventricular size is normal. There is normal pulmonary artery systolic pressure. The estimated right ventricular systolic pressure is 58.0 mmHg.  3. The mitral valve is normal in structure. Trivial mitral valve  regurgitation.  4. The aortic valve is tricuspid. Aortic valve regurgitation is not visualized. No aortic stenosis is present.  5. The inferior vena cava is normal in size  with greater than 50% respiratory variability, suggesting right atrial pressure of 3 mmHg. Comparison(s): No prior Echocardiogram. FINDINGS  Left Ventricle: Left ventricular ejection fraction, by estimation, is 60 to 65%. The left ventricle has normal function. The left ventricle has no regional wall motion abnormalities. The left ventricular internal cavity size was normal in size. There is  no left ventricular hypertrophy. Left ventricular diastolic parameters were normal. Right Ventricle: The right ventricular size is normal. No increase in right ventricular wall thickness. Right ventricular systolic function is normal. There is normal pulmonary artery systolic pressure. The tricuspid regurgitant velocity is 2.38 m/s, and  with an assumed right atrial pressure of 3 mmHg, the estimated right ventricular systolic pressure is 34.2 mmHg. Left Atrium: Left atrial size was normal in size. Right Atrium: Right atrial size was normal in size. Pericardium: There is no evidence of pericardial effusion. Mitral Valve: The mitral valve is normal in structure. Trivial mitral valve regurgitation. MV peak gradient, 3.2 mmHg. The mean mitral valve gradient is 1.0 mmHg. Tricuspid Valve: The tricuspid valve is normal in structure. Tricuspid valve regurgitation is trivial. Aortic Valve: The aortic valve is tricuspid. Aortic valve regurgitation is not visualized. No aortic stenosis is present. Aortic valve mean gradient measures 2.0 mmHg. Aortic valve peak gradient measures 4.2 mmHg. Aortic valve area, by VTI measures 2.99 cm. Pulmonic Valve: The pulmonic valve was normal in structure. Pulmonic valve regurgitation is trivial. Aorta: The aortic root is normal in size and structure. Venous: The inferior vena cava is normal in size with greater than 50% respiratory  variability, suggesting right atrial pressure of 3 mmHg. IAS/Shunts: The atrial septum is grossly normal.  LEFT VENTRICLE PLAX 2D LVIDd:         5.00 cm      Diastology LVIDs:         2.80 cm      LV e' medial:    12.60 cm/s LV PW:         1.00 cm      LV E/e' medial:  5.8 LV IVS:        1.20 cm      LV e' lateral:   20.00 cm/s LVOT diam:     2.10 cm      LV E/e' lateral: 3.7 LV SV:         74 LV SV Index:   40 LVOT Area:     3.46 cm  LV Volumes (MOD) LV vol d, MOD A2C: 125.0 ml LV vol d, MOD A4C: 96.6 ml LV vol s, MOD A2C: 41.6 ml LV vol s, MOD A4C: 29.5 ml LV SV MOD A2C:     83.4 ml LV SV MOD A4C:     96.6 ml LV SV MOD BP:      78.7 ml RIGHT VENTRICLE RV Basal diam:  3.80 cm RV Mid diam:    3.10 cm RV S prime:     12.40 cm/s TAPSE (M-mode): 2.4 cm LEFT ATRIUM             Index        RIGHT ATRIUM           Index LA diam:        3.90 cm 2.09 cm/m   RA Area:     17.40 cm LA Vol (A2C):   76.1 ml 40.79 ml/m  RA Volume:   49.80 ml  26.70 ml/m LA Vol (A4C):   38.1 ml 20.42 ml/m LA Biplane Vol: 56.5 ml  30.29 ml/m  AORTIC VALVE                    PULMONIC VALVE AV Area (Vmax):    3.23 cm     PV Vmax:       0.83 m/s AV Area (Vmean):   2.96 cm     PV Peak grad:  2.7 mmHg AV Area (VTI):     2.99 cm AV Vmax:           102.00 cm/s AV Vmean:          69.700 cm/s AV VTI:            0.247 m AV Peak Grad:      4.2 mmHg AV Mean Grad:      2.0 mmHg LVOT Vmax:         95.00 cm/s LVOT Vmean:        59.600 cm/s LVOT VTI:          0.213 m LVOT/AV VTI ratio: 0.86  AORTA Ao Root diam: 3.40 cm MITRAL VALVE               TRICUSPID VALVE MV Area (PHT): 2.80 cm    TR Peak grad:   22.7 mmHg MV Area VTI:   2.19 cm    TR Vmax:        238.00 cm/s MV Peak grad:  3.2 mmHg MV Mean grad:  1.0 mmHg    SHUNTS MV Vmax:       0.90 m/s    Systemic VTI:  0.21 m MV Vmean:      39.0 cm/s   Systemic Diam: 2.10 cm MV Decel Time: 271 msec MV E velocity: 73.70 cm/s MV A velocity: 30.80 cm/s MV E/A ratio:  2.39 Gwyndolyn Kaufman MD Electronically  signed by Gwyndolyn Kaufman MD Signature Date/Time: 03/24/2022/11:35:41 AM    Final    US Carotid Bilateral  Result Date: 03/24/2022 CLINICAL DATA:  Syncope and collapse while exercising Hypertension Former tobacco use EXAM: BILATERAL CAROTID DUPLEX ULTRASOUND TECHNIQUE: Pearline Cables scale imaging, color Doppler and duplex ultrasound were performed of bilateral carotid and vertebral arteries in the neck. COMPARISON:  None available FINDINGS: Criteria: Quantification of carotid stenosis is based on velocity parameters that correlate the residual internal carotid diameter with NASCET-based stenosis levels, using the diameter of the distal internal carotid lumen as the denominator for stenosis measurement. The following velocity measurements were obtained: RIGHT ICA: 97/43 cm/sec CCA: 063/01 cm/sec SYSTOLIC ICA/CCA RATIO:  0.8 ECA: 101 cm/sec LEFT ICA: 84/23 cm/sec CCA: 60/10 cm/sec SYSTOLIC ICA/CCA RATIO:  0.9 ECA: 74 cm/sec RIGHT CAROTID ARTERY: No significant atheromatous plaque. RIGHT VERTEBRAL ARTERY:  Antegrade flow. LEFT CAROTID ARTERY:  No significant atheromatous plaque. LEFT VERTEBRAL ARTERY:  Antegrade flow. IMPRESSION: No significant stenosis of internal carotid arteries. Electronically Signed   By: Miachel Roux M.D.   On: 03/24/2022 09:58   DG Chest Portable 1 View  Result Date: 03/23/2022 CLINICAL DATA:  Chest pain EXAM: PORTABLE CHEST 1 VIEW COMPARISON:  Chest x-ray dated March 22, 2022 FINDINGS: The heart size and mediastinal contours are within normal limits. Both lungs are clear. The visualized skeletal structures are unremarkable. IMPRESSION: No active disease. Electronically Signed   By: Yetta Glassman M.D.   On: 03/23/2022 15:33   DG Chest 2 View  Result Date: 03/22/2022 CLINICAL DATA:  chest pain EXAM: CHEST - 2 VIEW COMPARISON:  Chest x-ray 08/26/2021 FINDINGS: The heart and mediastinal contours are within normal limits.  Likely nipple shadow overlying the lower left lobe. No focal  consolidation. No pulmonary edema. No pleural effusion. No pneumothorax. No acute osseous abnormality. IMPRESSION: 1. No active cardiopulmonary disease. 2. Likely nipple shadow overlying the lower left lobe. Consider repeat frontal view with nipple markers. Electronically Signed   By: Iven Finn M.D.   On: 03/22/2022 19:45     Scheduled Meds:  aspirin EC  81 mg Oral Daily   enoxaparin (LOVENOX) injection  40 mg Subcutaneous Q24H   pantoprazole  40 mg Oral Daily   Continuous Infusions:   LOS: 0 days   Roxan Hockey M.D on 03/24/2022 at 3:45 PM  Go to [email or web address] - for contact info  [facility] - Office  [phone]  If 7PM-7AM, please contact night-coverage [email or web address] 03/24/2022, 3:45 PM

## 2022-03-25 ENCOUNTER — Ambulatory Visit: Payer: 59 | Admitting: General Surgery

## 2022-03-25 ENCOUNTER — Encounter (INDEPENDENT_AMBULATORY_CARE_PROVIDER_SITE_OTHER): Payer: Self-pay

## 2022-03-25 LAB — GLUCOSE, CAPILLARY
Glucose-Capillary: 91 mg/dL (ref 70–99)
Glucose-Capillary: 113 mg/dL — ABNORMAL HIGH (ref 70–99)

## 2022-03-25 NOTE — Progress Notes (Signed)
Alert and oriented x4. Pt transferred to [facility] via [place], all personal belongings packed by pt. Report called to [facility] by previous nurse. Paperwork sent to [facility] with pt. No distress noted at time of departure.

## 2022-03-25 NOTE — Progress Notes (Signed)
PROGRESS NOTE     Logan Benton, is a 41 y.o. male, DOB - 05/07/1981, UKG:[id number]  Admit date - 03/23/2022   Admitting Physician Javonda Suh Denton Brick, MD  Outpatient Primary MD for the patient is Lindell Spar, MD  LOS - 1  Chief Complaint  Patient presents with   Chest Pain        Brief Narrative:  41 y.o. male with medical history significant of hypertension, GERD and Barrett's esophagus admitted on 03/23/22 with chest pain and near syncopal episodes    -Assessment and Plan: 1)Atypical Chest pains--ruled out for ACS by cardiac enzymes and EKG -CTA chest with calcium score from August 2022 was unremarkable with a calcium score of Zero (0) and normal right dominant coronary arteries -ETT/EST from 11/25/2019 was unremarkable with excellent exercise tolerance -Echo from 03/24/2022 with EF of 60 to 65%, no wall motion abnormalities and no diastolic dysfunction, -No aortic stenosis -Hold off on beta-blockers until after stress test  2)Near Syncope/palpitations/tachyarrhythmia--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 tach patient will need ICD placement- -Hopefully can be evaluated by Dr. 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 tach*  Disposition: The patient is from: Home              Anticipated d/c is to: Home              Anticipated d/c  date is: 1 day              Patient currently is not medically stable to d/c. Barriers: Not Clinically Stable-   Code Status :  -  Code Status: Full Code   Family Communication:    NA (patient is alert, awake and coherent)   DVT Prophylaxis  :   - SCDs  enoxaparin (LOVENOX) injection 40 mg Start: 03/23/22 2230 SCDs Start: 03/23/22 2131   Lab Results  Component Value Date   PLT 229 03/24/2022    Inpatient Medications  Scheduled Meds:  aspirin EC  81 mg Oral Daily   enoxaparin (LOVENOX) injection  40 mg Subcutaneous Q24H   lisinopril  10 mg Oral Daily   pantoprazole  40 mg Oral Daily   Continuous Infusions: PRN Meds:.acetaminophen   Anti-infectives (From admission, onward)    None       Subjective: Logan Benton today has no fevers, no emesis,   -Recurrent left-sided chest pains/chest tightness Plan for transfer to [facility] for EP evaluation and exercise treadmill testing to assess for inducible polymorphic VT given 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 -If fails to have VT during exercise testing, may need to consider EP study   Objective: Vitals:   03/24/22 2134 03/25/22 0601 03/25/22 0900 03/25/22 1408  BP:  120/75 124/85 116/71  Pulse: 60 (!) 50 (!) 55 (!) 55  Resp:  '14 16 20  '$ Temp:  97.9 F (36.6 C) 98.4 F (36.9 C) 98.1 F (36.7 C)  TempSrc:  Oral Oral Oral  SpO2:  100% 100% 100%  Weight:      Height:        Intake/Output Summary (Last 24 hours) at 03/25/2022 1506 Last data filed at 03/24/2022 1813 Gross per 24 hour  Intake 480 ml  Output --  Net 480 ml   Filed Weights   03/23/22 1427  Weight: 68 kg    Physical Exam  Gen:- Awake Alert,  in no apparent distress  HEENT:- [place].AT, No sclera icterus Neck-Supple Neck,No JVD,.  Lungs-  CTAB , fair symmetrical air movement CV- S1, S2 normal, regular  Abd-  +ve B.Sounds, Abd Soft, No tenderness,    Extremity/Skin:- No  edema, pedal pulses present  Psych-affect is appropriate, oriented x3 Neuro-no new  focal deficits, no tremors  Data Reviewed: I have personally reviewed following labs and imaging studies  CBC: Recent Labs  Lab 03/22/22 1907 03/23/22 1519 03/24/22 0441  WBC 8.8 6.2 6.3  HGB 15.1 14.9 15.4  HCT 43.7 44.3 47.5  MCV 92.6 94.5 96.9  PLT 244 237 491   Basic Metabolic Panel: Recent Labs  Lab 03/22/22 1907 03/23/22 1519 03/24/22 0441  NA 135 137 139  K 3.5 4.1 4.3  CL 104 104 106  CO2 '23 27 28  '$ GLUCOSE 118* 99 99  BUN 22* 22* 23*  CREATININE 0.98 0.96 0.98  CALCIUM 9.6 9.2 9.3  MG  --   --  2.2  PHOS  --   --  4.2   GFR: Estimated Creatinine Clearance: 98.3 mL/min (by C-G formula based on SCr of 0.98 mg/dL). Liver Function Tests: Recent Labs  Lab 03/23/22 1519 03/24/22 0441  AST 26 23  ALT 27 26  ALKPHOS 39 37*  BILITOT 0.6 0.5  PROT 6.5 6.4*  ALBUMIN 4.0 4.0   Cardiac Enzymes: No results for input(s): "CKTOTAL", "CKMB", "CKMBINDEX", "TROPONINI" in the last 168 hours. BNP (last 3 results) No results for input(s): "PROBNP" in the last 8760 hours. HbA1C: No results for input(s): "HGBA1C" in the last 72 hours. Sepsis Labs: '@LABRCNTIP'$ (procalcitonin:4,lacticidven:4) )No results found for this or any previous visit (from the past 240 hour(s)).    Radiology Studies: ECHOCARDIOGRAM COMPLETE  Result Date: 03/24/2022    ECHOCARDIOGRAM REPORT   Patient Name:   Logan Benton Date of Exam: 03/24/2022 Medical Rec #:  [id number]        Height:       71.0 in Accession #:    [id number]       Weight:       149.9 lb Date of Birth:  01/29/1981       BSA:          1.865 m Patient Age:    36 years         BP:           113/68 mmHg Patient Gender: M                HR:           50 bpm. Exam Location:  [facility] Procedure: 2D Echo, Cardiac Doppler and Color Doppler Indications:    Chest Pain  History:        Patient has no prior history of Echocardiogram examinations.                 Signs/Symptoms:Chest Pain; Risk Factors:Hypertension.  Sonographer:    Wenda Low Referring Phys: [id number] OLADAPO ADEFESO IMPRESSIONS  1. Left ventricular ejection fraction, by estimation, is 60  to 65%. The left ventricle has normal function. The left ventricle has no regional wall motion abnormalities. Left ventricular diastolic parameters were normal.  2. Right ventricular systolic function is normal. The right ventricular size is normal. There is normal pulmonary artery systolic pressure. The estimated right ventricular systolic pressure is 37.1 mmHg.  3. The mitral valve is normal in structure. Trivial mitral valve regurgitation.  4. The aortic valve is tricuspid. Aortic valve regurgitation is not visualized. No aortic stenosis is present.  5. The inferior vena cava is normal in size with greater than 50% respiratory variability, suggesting right atrial pressure of 3 mmHg. Comparison(s): No prior Echocardiogram. FINDINGS  Left Ventricle: Left ventricular ejection fraction, by estimation, is 60 to 65%. The left ventricle has normal function. The left ventricle has no regional wall motion abnormalities. The left ventricular internal cavity size was normal in size. There is  no left ventricular hypertrophy. Left ventricular diastolic parameters were normal. Right Ventricle: The right ventricular size is normal. No increase in right ventricular wall thickness. Right ventricular systolic function is normal. There is normal pulmonary artery systolic pressure. The tricuspid regurgitant velocity is 2.38 m/s, and  with an assumed right atrial pressure of 3 mmHg, the estimated right ventricular systolic pressure is 06.2 mmHg. Left Atrium: Left atrial size was normal in size. Right Atrium: Right atrial size was normal in size. Pericardium: There is no evidence of pericardial effusion. Mitral Valve: The mitral valve is normal in structure. Trivial mitral valve regurgitation. MV peak gradient, 3.2 mmHg. The mean mitral valve gradient is 1.0 mmHg. Tricuspid Valve: The tricuspid valve is normal in  structure. Tricuspid valve regurgitation is trivial. Aortic Valve: The aortic valve is tricuspid. Aortic valve regurgitation is not visualized. No aortic stenosis is present. Aortic valve mean gradient measures 2.0 mmHg. Aortic valve peak gradient measures 4.2 mmHg. Aortic valve area, by VTI measures 2.99 cm. Pulmonic Valve: The pulmonic valve was normal in structure. Pulmonic valve regurgitation is trivial. Aorta: The aortic root is normal in size and structure. Venous: The inferior vena cava is normal in size with greater than 50% respiratory variability, suggesting right atrial pressure of 3 mmHg. IAS/Shunts: The atrial septum is grossly normal.  LEFT VENTRICLE PLAX 2D LVIDd:         5.00 cm      Diastology LVIDs:         2.80 cm      LV e' medial:    12.60 cm/s LV PW:         1.00 cm      LV E/e' medial:  5.8 LV IVS:        1.20 cm      LV e' lateral:   20.00 cm/s LVOT diam:     2.10 cm      LV E/e' lateral: 3.7 LV SV:         74 LV SV Index:   40 LVOT Area:     3.46 cm  LV Volumes (MOD) LV vol d, MOD A2C: 125.0 ml LV vol d, MOD A4C: 96.6 ml LV vol s, MOD A2C: 41.6 ml LV vol s, MOD A4C: 29.5 ml LV SV MOD A2C:     83.4 ml LV SV MOD A4C:     96.6 ml LV SV MOD BP:      78.7 ml RIGHT VENTRICLE RV Basal diam:  3.80 cm RV Mid diam:    3.10 cm RV S prime:     12.40 cm/s TAPSE (  M-mode): 2.4 cm LEFT ATRIUM             Index        RIGHT ATRIUM           Index LA diam:        3.90 cm 2.09 cm/m   RA Area:     17.40 cm LA Vol (A2C):   76.1 ml 40.79 ml/m  RA Volume:   49.80 ml  26.70 ml/m LA Vol (A4C):   38.1 ml 20.42 ml/m LA Biplane Vol: 56.5 ml 30.29 ml/m  AORTIC VALVE                    PULMONIC VALVE AV Area (Vmax):    3.23 cm     PV Vmax:       0.83 m/s AV Area (Vmean):   2.96 cm     PV Peak grad:  2.7 mmHg AV Area (VTI):     2.99 cm AV Vmax:           102.00 cm/s AV Vmean:          69.700 cm/s AV VTI:            0.247 m AV Peak Grad:      4.2 mmHg AV Mean Grad:      2.0 mmHg LVOT Vmax:         95.00 cm/s  LVOT Vmean:        59.600 cm/s LVOT VTI:          0.213 m LVOT/AV VTI ratio: 0.86  AORTA Ao Root diam: 3.40 cm MITRAL VALVE               TRICUSPID VALVE MV Area (PHT): 2.80 cm    TR Peak grad:   22.7 mmHg MV Area VTI:   2.19 cm    TR Vmax:        238.00 cm/s MV Peak grad:  3.2 mmHg MV Mean grad:  1.0 mmHg    SHUNTS MV Vmax:       0.90 m/s    Systemic VTI:  0.21 m MV Vmean:      39.0 cm/s   Systemic Diam: 2.10 cm MV Decel Time: 271 msec MV E velocity: 73.70 cm/s MV A velocity: 30.80 cm/s MV E/A ratio:  2.39 Gwyndolyn Kaufman MD Electronically signed by Gwyndolyn Kaufman MD Signature Date/Time: 03/24/2022/11:35:41 AM    Final    US Carotid Bilateral  Result Date: 03/24/2022 CLINICAL DATA:  Syncope and collapse while exercising Hypertension Former tobacco use EXAM: BILATERAL CAROTID DUPLEX ULTRASOUND TECHNIQUE: Pearline Cables scale imaging, color Doppler and duplex ultrasound were performed of bilateral carotid and vertebral arteries in the neck. COMPARISON:  None available FINDINGS: Criteria: Quantification of carotid stenosis is based on velocity parameters that correlate the residual internal carotid diameter with NASCET-based stenosis levels, using the diameter of the distal internal carotid lumen as the denominator for stenosis measurement. The following velocity measurements were obtained: RIGHT ICA: 97/43 cm/sec CCA: 683/41 cm/sec SYSTOLIC ICA/CCA RATIO:  0.8 ECA: 101 cm/sec LEFT ICA: 84/23 cm/sec CCA: 96/22 cm/sec SYSTOLIC ICA/CCA RATIO:  0.9 ECA: 74 cm/sec RIGHT CAROTID ARTERY: No significant atheromatous plaque. RIGHT VERTEBRAL ARTERY:  Antegrade flow. LEFT CAROTID ARTERY:  No significant atheromatous plaque. LEFT VERTEBRAL ARTERY:  Antegrade flow. IMPRESSION: No significant stenosis of internal carotid arteries. Electronically Signed   By: Miachel Roux M.D.   On: 03/24/2022 09:58   DG Chest Portable 1 View  Result  Date: 03/23/2022 CLINICAL DATA:  Chest pain EXAM: PORTABLE CHEST 1 VIEW COMPARISON:  Chest  x-ray dated March 22, 2022 FINDINGS: The heart size and mediastinal contours are within normal limits. Both lungs are clear. The visualized skeletal structures are unremarkable. IMPRESSION: No active disease. Electronically Signed   By: Yetta Glassman M.D.   On: 03/23/2022 15:33     Scheduled Meds:  aspirin EC  81 mg Oral Daily   enoxaparin (LOVENOX) injection  40 mg Subcutaneous Q24H   lisinopril  10 mg Oral Daily   pantoprazole  40 mg Oral Daily   Continuous Infusions:   LOS: 1 day   Roxan Hockey M.D on 03/25/2022 at 3:06 PM  Go to [email or web address] - for contact info  [facility] - Office  [phone]  If 7PM-7AM, please contact night-coverage [email or web address] 03/25/2022, 3:06 PM

## 2022-03-25 NOTE — Progress Notes (Signed)
Progress Note  Patient Name: Logan Benton Date of Encounter: 03/25/2022  [facility] Cardiologist: Jenkins Rouge, MD   Subjective   Continues to have mild chest tightness. No palpitations.   Inpatient Medications    Scheduled Meds:  aspirin EC  81 mg Oral Daily   enoxaparin (LOVENOX) injection  40 mg Subcutaneous Q24H   lisinopril  10 mg Oral Daily   pantoprazole  40 mg Oral Daily   Continuous Infusions:  PRN Meds: acetaminophen   Vital Signs    Vitals:   03/24/22 2131 03/24/22 2134 03/25/22 0601 03/25/22 0900  BP: 112/73  120/75 124/85  Pulse: (!) 56 60 (!) 50 (!) 55  Resp: '20  14 16  '$ Temp: 98.3 F (36.8 C)  97.9 F (36.6 C) 98.4 F (36.9 C)  TempSrc: Oral  Oral Oral  SpO2: 98%  100% 100%  Weight:      Height:        Intake/Output Summary (Last 24 hours) at 03/25/2022 0919 Last data filed at 03/24/2022 1813 Gross per 24 hour  Intake 720 ml  Output --  Net 720 ml      03/23/2022    2:27 PM 03/22/2022    6:52 PM 03/12/2022   11:01 AM  Last 3 Weights  Weight (lbs) 149 lb 14.6 oz 150 lb 158 lb  Weight (kg) 68 kg 68.04 kg 71.668 kg      Telemetry    Sinus bradycardia - Personally Reviewed  ECG    NSR, type I brugada pattern - Personally Reviewed  Physical Exam   GEN: No acute distress.   Neck: No JVD Cardiac: Bradycardic, regular, no murmurs, rubs, or gallops.  Respiratory: Clear to auscultation bilaterally. GI: Soft, nontender, non-distended  MS: No edema; No deformity. Neuro:  Nonfocal  Psych: Normal affect   Labs    High Sensitivity Troponin:   Recent Labs  Lab 03/22/22 1907 03/23/22 1519 03/23/22 1648  TROPONINIHS '7 2 2     '$ Chemistry Recent Labs  Lab 03/22/22 1907 03/23/22 1519 03/24/22 0441  NA 135 137 139  K 3.5 4.1 4.3  CL 104 104 106  CO2 '23 27 28  '$ GLUCOSE 118* 99 99  BUN 22* 22* 23*  CREATININE 0.98 0.96 0.98  CALCIUM 9.6 9.2 9.3  MG  --   --  2.2  PROT  --  6.5 6.4*  ALBUMIN  --  4.0 4.0  AST  --  26 23   ALT  --  27 26  ALKPHOS  --  39 37*  BILITOT  --  0.6 0.5  GFRNONAA >60 >60 >60  ANIONGAP '8 6 5    '$ Lipids No results for input(s): "CHOL", "TRIG", "HDL", "LABVLDL", "LDLCALC", "CHOLHDL" in the last 168 hours.  Hematology Recent Labs  Lab 03/22/22 1907 03/23/22 1519 03/24/22 0441  WBC 8.8 6.2 6.3  RBC 4.72 4.69 4.90  HGB 15.1 14.9 15.4  HCT 43.7 44.3 47.5  MCV 92.6 94.5 96.9  MCH 32.0 31.8 31.4  MCHC 34.6 33.6 32.4  RDW 12.5 12.7 12.6  PLT 244 237 229   Thyroid No results for input(s): "TSH", "FREET4" in the last 168 hours.  BNPNo results for input(s): "BNP", "PROBNP" in the last 168 hours.  DDimer No results for input(s): "DDIMER" in the last 168 hours.   Radiology    ECHOCARDIOGRAM COMPLETE  Result Date: 03/24/2022    ECHOCARDIOGRAM REPORT   Patient Name:   Logan Benton Date of Exam: 03/24/2022 Medical  Rec #:  [id number]        Height:       71.0 in Accession #:    [id number]       Weight:       149.9 lb Date of Birth:  09/18/1981       BSA:          1.865 m Patient Age:    41 years         BP:           113/68 mmHg Patient Gender: M                HR:           50 bpm. Exam Location:  [facility] Procedure: 2D Echo, Cardiac Doppler and Color Doppler Indications:    Chest Pain  History:        Patient has no prior history of Echocardiogram examinations.                 Signs/Symptoms:Chest Pain; Risk Factors:Hypertension.  Sonographer:    Wenda Low Referring Phys: [id number] OLADAPO ADEFESO IMPRESSIONS  1. Left ventricular ejection fraction, by estimation, is 60 to 65%. The left ventricle has normal function. The left ventricle has no regional wall motion abnormalities. Left ventricular diastolic parameters were normal.  2. Right ventricular systolic function is normal. The right ventricular size is normal. There is normal pulmonary artery systolic pressure. The estimated right ventricular systolic pressure is 86.7 mmHg.  3. The mitral valve is normal in structure. Trivial  mitral valve regurgitation.  4. The aortic valve is tricuspid. Aortic valve regurgitation is not visualized. No aortic stenosis is present.  5. The inferior vena cava is normal in size with greater than 50% respiratory variability, suggesting right atrial pressure of 3 mmHg. Comparison(s): No prior Echocardiogram. FINDINGS  Left Ventricle: Left ventricular ejection fraction, by estimation, is 60 to 65%. The left ventricle has normal function. The left ventricle has no regional wall motion abnormalities. The left ventricular internal cavity size was normal in size. There is  no left ventricular hypertrophy. Left ventricular diastolic parameters were normal. Right Ventricle: The right ventricular size is normal. No increase in right ventricular wall thickness. Right ventricular systolic function is normal. There is normal pulmonary artery systolic pressure. The tricuspid regurgitant velocity is 2.38 m/s, and  with an assumed right atrial pressure of 3 mmHg, the estimated right ventricular systolic pressure is 61.9 mmHg. Left Atrium: Left atrial size was normal in size. Right Atrium: Right atrial size was normal in size. Pericardium: There is no evidence of pericardial effusion. Mitral Valve: The mitral valve is normal in structure. Trivial mitral valve regurgitation. MV peak gradient, 3.2 mmHg. The mean mitral valve gradient is 1.0 mmHg. Tricuspid Valve: The tricuspid valve is normal in structure. Tricuspid valve regurgitation is trivial. Aortic Valve: The aortic valve is tricuspid. Aortic valve regurgitation is not visualized. No aortic stenosis is present. Aortic valve mean gradient measures 2.0 mmHg. Aortic valve peak gradient measures 4.2 mmHg. Aortic valve area, by VTI measures 2.99 cm. Pulmonic Valve: The pulmonic valve was normal in structure. Pulmonic valve regurgitation is trivial. Aorta: The aortic root is normal in size and structure. Venous: The inferior vena cava is normal in size with greater than 50%  respiratory variability, suggesting right atrial pressure of 3 mmHg. IAS/Shunts: The atrial septum is grossly normal.  LEFT VENTRICLE PLAX 2D LVIDd:         5.00 cm  Diastology LVIDs:         2.80 cm      LV e' medial:    12.60 cm/s LV PW:         1.00 cm      LV E/e' medial:  5.8 LV IVS:        1.20 cm      LV e' lateral:   20.00 cm/s LVOT diam:     2.10 cm      LV E/e' lateral: 3.7 LV SV:         74 LV SV Index:   40 LVOT Area:     3.46 cm  LV Volumes (MOD) LV vol d, MOD A2C: 125.0 ml LV vol d, MOD A4C: 96.6 ml LV vol s, MOD A2C: 41.6 ml LV vol s, MOD A4C: 29.5 ml LV SV MOD A2C:     83.4 ml LV SV MOD A4C:     96.6 ml LV SV MOD BP:      78.7 ml RIGHT VENTRICLE RV Basal diam:  3.80 cm RV Mid diam:    3.10 cm RV S prime:     12.40 cm/s TAPSE (M-mode): 2.4 cm LEFT ATRIUM             Index        RIGHT ATRIUM           Index LA diam:        3.90 cm 2.09 cm/m   RA Area:     17.40 cm LA Vol (A2C):   76.1 ml 40.79 ml/m  RA Volume:   49.80 ml  26.70 ml/m LA Vol (A4C):   38.1 ml 20.42 ml/m LA Biplane Vol: 56.5 ml 30.29 ml/m  AORTIC VALVE                    PULMONIC VALVE AV Area (Vmax):    3.23 cm     PV Vmax:       0.83 m/s AV Area (Vmean):   2.96 cm     PV Peak grad:  2.7 mmHg AV Area (VTI):     2.99 cm AV Vmax:           102.00 cm/s AV Vmean:          69.700 cm/s AV VTI:            0.247 m AV Peak Grad:      4.2 mmHg AV Mean Grad:      2.0 mmHg LVOT Vmax:         95.00 cm/s LVOT Vmean:        59.600 cm/s LVOT VTI:          0.213 m LVOT/AV VTI ratio: 0.86  AORTA Ao Root diam: 3.40 cm MITRAL VALVE               TRICUSPID VALVE MV Area (PHT): 2.80 cm    TR Peak grad:   22.7 mmHg MV Area VTI:   2.19 cm    TR Vmax:        238.00 cm/s MV Peak grad:  3.2 mmHg MV Mean grad:  1.0 mmHg    SHUNTS MV Vmax:       0.90 m/s    Systemic VTI:  0.21 m MV Vmean:      39.0 cm/s   Systemic Diam: 2.10 cm MV Decel Time: 271 msec MV E velocity: 73.70 cm/s MV A velocity: 30.80 cm/s MV E/A ratio:  2.39 Gwyndolyn Kaufman MD  Electronically signed by Gwyndolyn Kaufman MD Signature Date/Time: 03/24/2022/11:35:41 AM    Final    US Carotid Bilateral  Result Date: 03/24/2022 CLINICAL DATA:  Syncope and collapse while exercising Hypertension Former tobacco use EXAM: BILATERAL CAROTID DUPLEX ULTRASOUND TECHNIQUE: Pearline Cables scale imaging, color Doppler and duplex ultrasound were performed of bilateral carotid and vertebral arteries in the neck. COMPARISON:  None available FINDINGS: Criteria: Quantification of carotid stenosis is based on velocity parameters that correlate the residual internal carotid diameter with NASCET-based stenosis levels, using the diameter of the distal internal carotid lumen as the denominator for stenosis measurement. The following velocity measurements were obtained: RIGHT ICA: 97/43 cm/sec CCA: 449/67 cm/sec SYSTOLIC ICA/CCA RATIO:  0.8 ECA: 101 cm/sec LEFT ICA: 84/23 cm/sec CCA: 59/16 cm/sec SYSTOLIC ICA/CCA RATIO:  0.9 ECA: 74 cm/sec RIGHT CAROTID ARTERY: No significant atheromatous plaque. RIGHT VERTEBRAL ARTERY:  Antegrade flow. LEFT CAROTID ARTERY:  No significant atheromatous plaque. LEFT VERTEBRAL ARTERY:  Antegrade flow. IMPRESSION: No significant stenosis of internal carotid arteries. Electronically Signed   By: Miachel Roux M.D.   On: 03/24/2022 09:58   DG Chest Portable 1 View  Result Date: 03/23/2022 CLINICAL DATA:  Chest pain EXAM: PORTABLE CHEST 1 VIEW COMPARISON:  Chest x-ray dated March 22, 2022 FINDINGS: The heart size and mediastinal contours are within normal limits. Both lungs are clear. The visualized skeletal structures are unremarkable. IMPRESSION: No active disease. Electronically Signed   By: Yetta Glassman M.D.   On: 03/23/2022 15:33    Cardiac Studies   TTE 03/24/22: IMPRESSIONS   1. Left ventricular ejection fraction, by estimation, is 60 to 65%. The  left ventricle has normal function. The left ventricle has no regional  wall motion abnormalities. Left ventricular diastolic  parameters were  normal.   2. Right ventricular systolic function is normal. The right ventricular  size is normal. There is normal pulmonary artery systolic pressure. The  estimated right ventricular systolic pressure is 38.4 mmHg.   3. The mitral valve is normal in structure. Trivial mitral valve  regurgitation.   4. The aortic valve is tricuspid. Aortic valve regurgitation is not  visualized. No aortic stenosis is present.   5. The inferior vena cava is normal in size with greater than 50%  respiratory variability, suggesting right atrial pressure of 3 mmHg.   Comparison(s): No prior Echocardiogram.   Patient Profile     41 y.o. 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 Specifically, the patient was running hard and when he slowed down, his heart continued to beat out of his chest. He began to lose his vision and sat down and passed out on a bench. He came back to shortly thereafter and the palpitations had resolved, but he had some chest discomfort and lingering fatigue. He presented to the ER later that day but the wait was so long that he left before being fully evaluated. The next morning, he continued to have chest discomfort prompting him to come to the ER. Here, ECG showed a NSR with a Brugada type I pattern. Labs otherwise unremarkable. TTE with normal BiV function  with no significant valve disease. Given exercise induced syncope as well as a type I Brugada pattern on ECG, will plan for transfer to [facility] for EP evaluation and treadmill testing to assess for polymorphic VT. This was discussed with Dr.  Lovena Le who is rounding today.  -Plan for transfer to [facility] for EP evaluation and exercise treadmill testing to assess for inducible polymorphic VT given 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 -If fails to have VT during exercise testing, may need to consider EP study   Gwyndolyn Kaufman, MD      For questions or updates, please contact [facility] Please consult [email or web address] for contact info under        Signed, Freada Bergeron, MD  03/25/2022, 9:19 AM

## 2022-03-26 ENCOUNTER — Inpatient Hospital Stay (HOSPITAL_BASED_OUTPATIENT_CLINIC_OR_DEPARTMENT_OTHER): Payer: Self-pay

## 2022-03-26 ENCOUNTER — Encounter (HOSPITAL_COMMUNITY)
Admission: EM | Disposition: A | Discharge status: Home / Self Care | Payer: Self-pay | Source: Home / Self Care | Attending: Family Medicine

## 2022-03-26 ENCOUNTER — Inpatient Hospital Stay (HOSPITAL_COMMUNITY): Payer: Self-pay

## 2022-03-26 DIAGNOSIS — R55 Syncope and collapse: Secondary | ICD-10-CM

## 2022-03-26 LAB — BASIC METABOLIC PANEL
Anion gap: 7 (ref 5–15)
BUN: 14 mg/dL (ref 6–20)
CO2: 27 mmol/L (ref 22–32)
Calcium: 9.5 mg/dL (ref 8.9–10.3)
Chloride: 106 mmol/L (ref 98–111)
Creatinine, Ser: 1.1 mg/dL (ref 0.61–1.24)
GFR, Estimated: >60 mL/min (ref >60)
Glucose, Bld: 98 mg/dL (ref 70–99)
Potassium: 4.7 mmol/L (ref 3.5–5.1)
Sodium: 140 mmol/L (ref 135–145)

## 2022-03-26 LAB — EXERCISE TOLERANCE TEST
Angina Index: 0
Duke Treadmill Score: 14
Estimated workload: 17.2
Exercise duration (min): 14 min
MPHR: 182 {beats}/min
Peak HR: 176 {beats}/min
Percent HR: 96 %
Rest HR: 72 {beats}/min
ST Depression (mm): 0 mm

## 2022-03-26 SURGERY — LOOP RECORDER INSERTION
Anesthesia: LOCAL

## 2022-03-26 MED ORDER — LIDOCAINE-EPINEPHRINE 1 %-1:100000 IJ SOLN
INTRAMUSCULAR | Status: DC | PRN
  Administered 2022-03-26: 10 mL

## 2022-03-26 MED ORDER — METOPROLOL SUCCINATE ER 25 MG PO TB24
25.0000 mg | ORAL_TABLET | Freq: Every day | ORAL | Status: DC
  Filled 2022-03-26: qty 1

## 2022-03-26 MED ORDER — METOPROLOL SUCCINATE ER 25 MG PO TB24: 25.0000 mg | ORAL_TABLET | Freq: Every day | ORAL | 1 refills | Status: DC

## 2022-03-26 MED ORDER — ASPIRIN 81 MG PO TBEC: 81.0000 mg | DELAYED_RELEASE_TABLET | Freq: Every day | ORAL | 12 refills | Status: DC

## 2022-03-26 MED ORDER — LIDOCAINE-EPINEPHRINE 1 %-1:100000 IJ SOLN
INTRAMUSCULAR | Status: AC
  Filled 2022-03-26: qty 1

## 2022-03-26 SURGICAL SUPPLY — 2 items
MONITOR CARDIAC LUX-DX INSERT (Prosthesis & Implant Heart) ×1 IMPLANT
PACK LOOP INSERTION (CUSTOM PROCEDURE TRAY) ×2 IMPLANT

## 2022-03-26 NOTE — Discharge Instructions (Signed)
No driving 6 months  Post procedure wound care instructions Keep incision clean and dry for 3 days. You can remove outer dressing tomorrow. Leave steri-strips (little pieces of tape) on until seen in the office for wound check appointment. Call the office [phone]) for redness, drainage, swelling, or fever.

## 2022-03-26 NOTE — Progress Notes (Addendum)
ETT did not disclose an arrhythmia or reproduce his symptoms. Cause for his syncope is unknown Will plan for loop implant and he can discharge after that. Change his lisinopril to Toprol '25mg'$  daily EP follow up will be arranged.  I have discussed with the patient that with unexplained syncope he can not drive for 6 mo, made aware of [place] law.  Tommye Standard, PA-C

## 2022-03-26 NOTE — Consult Note (Addendum)
Cardiology Consultation:   Patient ID: Logan Benton MRN: [id number]; DOB: [birth year]  Admit date: 03/23/2022 Date of Consult: 03/26/2022  PCP:  Lindell Spar, MD   [facility] Providers Cardiologist:  Jenkins Rouge, MD    Patient Profile:   Logan Benton is a 41 y.o. male with a hx of HTN, anxiety who is being seen 03/26/2022 for the evaluation of syncope and abnormal EKG at the request of Dr. Johney Frame.  History of Present Illness:   Mr. Trammel reports that he exercises regularly and at high level of exertion often on trails with intermittent jogging/hiking. The day of his admission had been on a trail, was jogging at his peak exercise capacity when he felt a change in his heart beat/faster made him feel like he was walking through water/some kind of resistance, muscles felt weak, then he started to feel weak, lightheaded and sat on a nearby bench, woke on the ground.  His sense is that he was out only momentarily, did have some vague aching in his chest associated with the palpitations. He felt a bit sluggish once awake, but fairly quickly better .  He has had prior symptom of a nondescript aching chest discomfort w/u in the past with no CAD and preserved LVEF, some priorp brief palpitations with a monitor with SR/Sinus arrhythmia only noted.  He has never fainted before No family hx of SCD  LABS K+ 3.5 > 4.1 >>> 4.7 Mag 2.2 BUN/Creat 14/1.10 HS Trop 7, 2, 2 WBC 6.3 H/H 15/47 Plts 229    Past Medical History:  Diagnosis Date   Abscessed tooth    Anxiety    C. difficile diarrhea    presumptive diagnosis   Depression    Hypertension    Trigger finger     Past Surgical History:  Procedure Laterality Date   BILATERAL CARPAL TUNNEL RELEASE     BIOPSY  06/29/2020   Procedure: BIOPSY;  Surgeon: Eloise Harman, DO;  [facility];  Service: Endoscopy;;   BIOPSY  09/03/2020   Procedure: BIOPSY;  Surgeon: Eloise Harman, DO;  [facility];  Service: Endoscopy;;   BIOPSY  03/08/2021   Procedure: BIOPSY;  Surgeon: Eloise Harman, DO;  [facility];  Service: Endoscopy;;   BIOPSY  05/10/2021   Procedure: BIOPSY;  Surgeon: Eloise Harman, DO;  [facility];  Service: Endoscopy;;   COLONOSCOPY WITH PROPOFOL N/A 06/29/2020   Dr. Abbey Chatters: 4 mm tubular adenoma removed, nonbleeding internal hemorrhoids.  Repeat colonoscopy in 7 years.   COLONOSCOPY WITH PROPOFOL N/A 05/10/2021   Procedure: COLONOSCOPY WITH PROPOFOL;  Surgeon: Eloise Harman, DO;  [facility]: [facility];  Service: Endoscopy;  Laterality: N/A;  1:15pm   ESOPHAGOGASTRODUODENOSCOPY (EGD) WITH PROPOFOL N/A 06/29/2020   Dr. Abbey Chatters: LA grade C esophagitis, gastritis with mild chronic gastritis on path, small bowel biopsy unremarkable.   ESOPHAGOGASTRODUODENOSCOPY (EGD) WITH PROPOFOL N/A 09/03/2020   Dr. Abbey Chatters: Esophageal mucosal changes suggestive of short segment Barrett's esophagus, biopsies consistent.  Next EGD in 3 years   ESOPHAGOGASTRODUODENOSCOPY (EGD) WITH PROPOFOL N/A 03/08/2021   Procedure: ESOPHAGOGASTRODUODENOSCOPY (EGD) WITH PROPOFOL;  Surgeon: Eloise Harman, DO;  [facility];  Service: Endoscopy;  Laterality: N/A;  1:00pm   POLYPECTOMY  06/29/2020   Procedure: POLYPECTOMY;  Surgeon: Eloise Harman, DO;  [facility];  Service: Endoscopy;;   SHOULDER SURGERY Right    x2   TRIGGER FINGER RELEASE  Right 2016     Home Medications:  Prior to Admission medications   Medication Sig Start Date End Date Taking? Authorizing Provider  Ascorbic Acid (VITAMIN C PO) Take 1 tablet by mouth daily.   Yes [provider]  lisinopril (ZESTRIL) 5 MG tablet Take 1 tablet (5 mg total) by mouth at bedtime. 01/17/22  Yes Lindell Spar, MD  Multiple Vitamin (MULTIVITAMIN WITH MINERALS) TABS tablet Take 1 tablet by mouth daily.   Yes [provider]  nystatin (MYCOSTATIN) 100000 UNIT/ML suspension   02/17/22  Yes [provider]  omeprazole (PRILOSEC) 20 MG capsule Take 1 capsule (20 mg total) by mouth every morning. 03/06/22  Yes Lindell Spar, MD  Probiotic Product (PROBIOTIC-10 PO) Take 1 capsule by mouth daily.   Yes [provider]    Inpatient Medications: Scheduled Meds:  aspirin EC  81 mg Oral Daily   enoxaparin (LOVENOX) injection  40 mg Subcutaneous Q24H   lisinopril  10 mg Oral Daily   pantoprazole  40 mg Oral Daily   Continuous Infusions:  PRN Meds: acetaminophen  Allergies:   No Known Allergies  Social History:   Social History   Socioeconomic History   Marital status: Divorced    Spouse name: Not on file   Number of children: Not on file   Years of education: Not on file   Highest education level: Not on file  Occupational History   Not on file  Tobacco Use   Smoking status: Former    Packs/day: 0.00    Years: 0.00    Total pack years: 0.00    Types: Cigarettes    Quit date: 11/03/2019    Years since quitting: 2.3    Passive exposure: Current   Smokeless tobacco: Never   Tobacco comments:    nicoten gum  Vaping Use   Vaping Use: Former   Quit date: 03/07/2019  Substance and Sexual Activity   Alcohol use: No   Drug use: Yes    Types: Marijuana   Sexual activity: Not Currently    Partners: Female  Other Topics Concern   Not on file  Social History Narrative   Not on file   Social Determinants of Health   Financial Resource Strain: Not on file  Food Insecurity: Not on file  Transportation Needs: Not on file  Physical Activity: Not on file  Stress: Not on file  Social Connections: Not on file  Intimate Partner Violence: Not on file    Family History:   Family History  Problem Relation Age of Onset   Rheum arthritis Mother    Bladder Cancer Mother    Kidney disease Father    Bladder Cancer Maternal Grandfather    Irritable bowel syndrome Paternal Grandmother    Colon cancer Neg Hx    Celiac disease Neg Hx     Inflammatory bowel disease Neg Hx      ROS:  Please see the history of present illness.  All other ROS reviewed and negative.     Physical Exam/Data:   Vitals:   03/25/22 1408 03/25/22 2116 03/26/22 0011 03/26/22 0407  BP: 116/71 135/86 123/71 112/67  Pulse: (!) 55 60 (!) 51 (!) 55  Resp: '20 18 18 16  '$ Temp: 98.1 F (36.7 C)  98.1 F (36.7 C) (!) 97.5 F (36.4 C)  TempSrc: Oral  Oral Oral  SpO2: 100% 98%    Weight:  66.9 kg    Height:  Intake/Output Summary (Last 24 hours) at 03/26/2022 0834 Last data filed at 03/25/2022 2100 Gross per 24 hour  Intake 240 ml  Output --  Net 240 ml      03/25/2022    9:16 PM 03/23/2022    2:27 PM 03/22/2022    6:52 PM  Last 3 Weights  Weight (lbs) 147 lb 6.4 oz 149 lb 14.6 oz 150 lb  Weight (kg) 66.86 kg 68 kg 68.04 kg     Body mass index is 20.56 kg/m.  General:  Well nourished, well developed, fit body habitus, in no acute distress HEENT: normal Neck: no JVD Vascular: No carotid bruits Cardiac:  RRR; no murmurs, gallops or rubs Lungs:  CTA b/l, no wheezing, rhonchi or rales  Abd: soft, nontender, no hepatomegaly  Ext: no edema Musculoskeletal:  No deformities, Skin: warm and dry  Neuro:  no focal abnormalities noted Psych:  Normal affect   EKG:  The EKG was personally reviewed and demonstrates:   SR 98, icRBBB SR 76, unchanged SB 56 unchanged  Today SB 56 done with V1,2 placed superiorly unchanged  Telemetry:  Telemetry was personally reviewed and demonstrates:  SB/SR 50's-60's  Relevant CV Studies:  03/24/22: TTE  1. Left ventricular ejection fraction, by estimation, is 60 to 65%. The  left ventricle has normal function. The left ventricle has no regional  wall motion abnormalities. Left ventricular diastolic parameters were  normal.   2. Right ventricular systolic function is normal. The right ventricular  size is normal. There is normal pulmonary artery systolic pressure. The  estimated right ventricular  systolic pressure is 19.3 mmHg.   3. The mitral valve is normal in structure. Trivial mitral valve  regurgitation.   4. The aortic valve is tricuspid. Aortic valve regurgitation is not  visualized. No aortic stenosis is present.   5. The inferior vena cava is normal in size with greater than 50%  respiratory variability, suggesting right atrial pressure of 3 mmHg.    05/15/2021: Coronary CTa IMPRESSION: 1. Calcium score 0 2.  CAD RADS 0 normal right dominant coronary arteries 3.  Normal aortic root 2.8 cm   09/23/2020: ETT Possible epsilon wave noted in lead V1 on resting tracings. More typical looking incomplete right bundle branch block pattern in lead V1 and V2 during exercise. In light of these findings, would consider further work-up for arrhythmogenic right ventricular dysplasia, particularly if patient has symptoms of palpitations or syncope. Negative GXT for ischemia by standard criteria. Maximum workload 13.4 METS. No chest pain reported. Normal blood pressure response to exercise. Rare PVCs noted including fusion beat PVC couplets during exercise. No sustained arrhythmias. Low risk Duke treadmill score of 10.2.  March 2021: Monitor Sinus rhythm, sinus arrhythmia, sinus bradycardia, and sinus tachycardia. Symptoms corresponded with all of the above.  Laboratory Data:  High Sensitivity Troponin:   Recent Labs  Lab 03/22/22 1907 03/23/22 1519 03/23/22 1648  TROPONINIHS '7 2 2     '$ Chemistry Recent Labs  Lab 03/23/22 1519 03/24/22 0441 03/26/22 0209  NA 137 139 140  K 4.1 4.3 4.7  CL 104 106 106  CO2 '27 28 27  '$ GLUCOSE 99 99 98  BUN 22* 23* 14  CREATININE 0.96 0.98 1.10  CALCIUM 9.2 9.3 9.5  MG  --  2.2  --   GFRNONAA >60 >60 >60  ANIONGAP '6 5 7    '$ Recent Labs  Lab 03/23/22 1519 03/24/22 0441  PROT 6.5 6.4*  ALBUMIN 4.0 4.0  AST 26  23  ALT 27 26  ALKPHOS 39 37*  BILITOT 0.6 0.5   Lipids No results for input(s): "CHOL", "TRIG", "HDL", "LABVLDL",  "LDLCALC", "CHOLHDL" in the last 168 hours.  Hematology Recent Labs  Lab 03/22/22 1907 03/23/22 1519 03/24/22 0441  WBC 8.8 6.2 6.3  RBC 4.72 4.69 4.90  HGB 15.1 14.9 15.4  HCT 43.7 44.3 47.5  MCV 92.6 94.5 96.9  MCH 32.0 31.8 31.4  MCHC 34.6 33.6 32.4  RDW 12.5 12.7 12.6  PLT 244 237 229   Thyroid No results for input(s): "TSH", "FREET4" in the last 168 hours.  BNPNo results for input(s): "BNP", "PROBNP" in the last 168 hours.  DDimer No results for input(s): "DDIMER" in the last 168 hours.   Radiology/Studies:   US Carotid Bilateral Result Date: 03/24/2022 CLINICAL DATA:  Syncope and collapse while exercising Hypertension Former tobacco use EXAM: BILATERAL CAROTID DUPLEX ULTRASOUND TECHNIQUE: Pearline Cables scale imaging, color Doppler and duplex ultrasound were performed of bilateral carotid and vertebral arteries in the neck. COMPARISON:  None available FINDINGS: Criteria: Quantification of carotid stenosis is based on velocity parameters that correlate the residual internal carotid diameter with NASCET-based stenosis levels, using the diameter of the distal internal carotid lumen as the denominator for stenosis measurement. The following velocity measurements were obtained: RIGHT ICA: 97/43 cm/sec CCA: 940/76 cm/sec SYSTOLIC ICA/CCA RATIO:  0.8 ECA: 101 cm/sec LEFT ICA: 84/23 cm/sec CCA: 80/88 cm/sec SYSTOLIC ICA/CCA RATIO:  0.9 ECA: 74 cm/sec RIGHT CAROTID ARTERY: No significant atheromatous plaque. RIGHT VERTEBRAL ARTERY:  Antegrade flow. LEFT CAROTID ARTERY:  No significant atheromatous plaque. LEFT VERTEBRAL ARTERY:  Antegrade flow. IMPRESSION: No significant stenosis of internal carotid arteries. Electronically Signed   By: Miachel Roux M.D.   On: 03/24/2022 09:58   DG Chest Portable 1 View Result Date: 03/23/2022 CLINICAL DATA:  Chest pain EXAM: PORTABLE CHEST 1 VIEW COMPARISON:  Chest x-ray dated March 22, 2022 FINDINGS: The heart size and mediastinal contours are within normal limits.  Both lungs are clear. The visualized skeletal structures are unremarkable. IMPRESSION: No active disease. Electronically Signed   By: Yetta Glassman M.D.   On: 03/23/2022 15:33   DG Chest 2 View Result Date: 03/22/2022 CLINICAL DATA:  chest pain EXAM: CHEST - 2 VIEW COMPARISON:  Chest x-ray 08/26/2021 FINDINGS: The heart and mediastinal contours are within normal limits. Likely nipple shadow overlying the lower left lobe. No focal consolidation. No pulmonary edema. No pleural effusion. No pneumothorax. No acute osseous abnormality. IMPRESSION: 1. No active cardiopulmonary disease. 2. Likely nipple shadow overlying the lower left lobe. Consider repeat frontal view with nipple markers. Electronically Signed   By: Iven Finn M.D.   On: 03/22/2022 19:45     Assessment and Plan:   Syncope Abnormal EKG suggests Brugada Plan ETT today to evaluate for any exercise induced arrhythmias ? SVT ? VT   Prior ETT without arrhythmia noted Monitor as well done in 2021 without arrhythmia  Risk Assessment/Risk Scores:    For questions or updates, please contact [facility] Please consult [email or web address] for contact info under    Signed, Baldwin Jamaica, PA-C  03/26/2022 8:34 AM  I have seen and examined this patient with Tommye Standard.  Agree with above, note added to reflect my findings.  He presented to the hospital after an episode of syncope.  He is usually very active, does hiking and jogging on trails.  Most recently, he was jogging quite quickly on a trail.  He felt all of a  sudden palpitations and stopped running.  He sat down on a bench and palpitations worsened.  He woke up on the ground.  He feels that he was only out momentarily.  He had a vague aching sensation in his chest post syncope.  After he woke up, he did not have any further palpitations.  This is not happened to him before.  He has no family history of cardiac disease or sudden death.  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  He has had a normal cardiac work-up last September with an echo and a CT that showed no evidence of coronary disease.  As he was exercising when this episode occurred, we Burris Matherne get an exercise treadmill test.  If this shows ventricular arrhythmias, he would benefit from ICD therapy.  If no arrhythmias are seen, we Cyncere Ruhe have a discussion further with the patient on either ICD versus continued monitoring.  Khiem Gargis M. Analeise Mccleery MD 03/26/2022 10:31 AM

## 2022-03-26 NOTE — Discharge Summary (Signed)
Physician Discharge Summary  Logan Benton RJJ:[id number] DOB: [birth year] DOA: 03/23/2022  PCP: Lindell Spar, MD  Admit date: 03/23/2022 Discharge date: 03/26/2022  Admitted From: home Disposition:  home  Recommendations for Outpatient Follow-up:  Follow up with PCP in 1-2 weeks Follow up with cardiology as scheduled  Home Health:None  Equipment/Devices:None  Discharge Condition:Stable  CODE STATUS:Full  Diet recommendation: Low salt/low fat diet    Brief/Interim Summary: 41 y.o. male with medical history significant of hypertension, GERD and Barrett's esophagus admitted on 03/23/22 with chest pain and near syncopal episodes.  Patient transferred to [facility] for further cardiac evaluation of atypical chest pains with near syncopal event initially concerning for Brugada syndrome.  Patient taken for stress test today, unfortunately despite increased in strenuous activity patient was unable to repeat symptoms previously noted.  Nothing notable on telemetry per cardiology or during stress test.  As such given his age and symptoms cardiology implanted a loop recorder today for further evaluation, recommended to discontinue ACE inhibitor and add metoprolol 25 twice daily.  Otherwise continue routine scheduled follow-up with PCP and follow-up with cardiology as scheduled.  Patient otherwise stable and agreeable for discharge home, medication changes as below otherwise chronic comorbid conditions remained stable.  Of note patient was educated he cannot drive for 6 months given unknown/unspecified syncopal event.  This is per [employer].  Discharge Diagnoses:  Principal Problem:   Chest pain Active Problems:   Barrett's esophagus   Primary hypertension   Near syncope    Discharge Instructions  Discharge Instructions     Discharge patient   Complete by: As directed    Discharge disposition: 01-Home or Self Care   Discharge patient date: 03/26/2022       Allergies as of 03/26/2022   No Known Allergies      Medication List     STOP taking these medications    lisinopril 5 MG tablet Commonly known as: ZESTRIL   nystatin 100000 UNIT/ML suspension Commonly known as: MYCOSTATIN       TAKE these medications    aspirin EC 81 MG tablet Take 1 tablet (81 mg total) by mouth daily. Swallow whole. Start taking on: March 27, 2022   metoprolol succinate 25 MG 24 hr tablet Commonly known as: TOPROL-XL Take 1 tablet (25 mg total) by mouth daily. Start taking on: March 27, 2022   multivitamin with minerals Tabs tablet Take 1 tablet by mouth daily.   omeprazole 20 MG capsule Commonly known as: PRILOSEC Take 1 capsule (20 mg total) by mouth every morning.   PROBIOTIC-10 PO Take 1 capsule by mouth daily.   VITAMIN C PO Take 1 tablet by mouth daily.        Follow-up Information     Shirley Friar, PA-C Follow up.   Specialty: Physician Assistant Why: 05/25/22 @ 10:00AM, for Dr. Joya San information: [place] [address] [phone]                No Known Allergies  Consultations: Cardiology   Procedures/Studies: EP PPM/ICD IMPLANT  Result Date: 03/26/2022 SURGEON:  Allegra Lai, MD   PREPROCEDURE DIAGNOSIS:  Syncope   POSTPROCEDURE DIAGNOSIS:  Syncope    PROCEDURES:  1. Implantable loop recorder implantation   INTRODUCTION:  Logan Benton is a 41 y.o. male with a history of syncope who presents today for implantable loop implantation.  The patient has had syncope without a cause identified.  he has worn telemetry previously during which he did not have arrhythmias.  There is significant concern for possible arrhythmia as the cause for the syncope. The patient therefore presents today for implantable loop implantation.   DESCRIPTION OF PROCEDURE:  Informed written consent was obtained, and the patient was brought to the electrophysiology lab in a fasting state.  The  patient required no sedation for the procedure today.  Mapping over the patient's chest was performed by the EP lab staff to identify the area where electrograms were most prominent for ILR recording.  This area was found to be the left parasternal region over the 3rd-4th intercostal space. The patients left chest was therefore prepped and draped in the usual sterile fashion by the EP lab staff. The skin overlying the left parasternal region was infiltrated with lidocaine for local analgesia.  A 0.5-cm incision was made over the left parasternal region over the 3rd intercostal space.  A subcutaneous ILR pocket was fashioned using a combination of sharp and blunt dissection.  A [employer] LUX-Dx [id number] implantable loop recorder was then placed into the pocket  R waves were very prominent and measured 0.48m.  Steri- Strips and a sterile dressing were then applied.  There were no early apparent complications.   CONCLUSIONS:  1. Successful implantation of a [employer] LUX-Dx implantable loop recorder for syncope  2. No early apparent complications. Will MMeredith Leeds MD 03/26/2022 4:07 PM  Exercise Tolerance Test  Result Date: 03/26/2022   Excellent exercise capacity, achieved 17.2 METS   Peak heart rate 176 bpm (96% max age predicted HR)   No ST deviation was noted.   No evidence of ischemia   ECHOCARDIOGRAM COMPLETE  Result Date: 03/24/2022    ECHOCARDIOGRAM REPORT   Patient Name:   Logan VENARDDate of Exam: 03/24/2022 Medical Rec #:  [id number]       Height:       71.0 in Accession #:    [id number]      Weight:       149.9 lb Date of Birth:  [birth year]      BSA:          1.865 m Patient Age:    89years         BP:           113/68 mmHg Patient Gender: M                HR:           50 bpm. Exam [facility]:  [facility]: 2D Echo, Cardiac Doppler and Color Doppler Indications:    Chest Pain  History:        Patient has no prior history of Echocardiogram examinations.                  Signs/Symptoms:Chest Pain; Risk Factors:Hypertension.  Sonographer:    DWenda LowReferring Phys: [id number] ADEFESO IMPRESSIONS  1. Left ventricular ejection fraction, by estimation, is 60 to 65%. The left ventricle has normal function. The left ventricle has no regional wall motion abnormalities. Left ventricular diastolic parameters were normal.  2. Right ventricular systolic function is normal. The right ventricular size is normal. There is normal pulmonary artery systolic pressure. The estimated right ventricular systolic pressure is 227.7mmHg.  3. The mitral valve is normal in structure. Trivial mitral valve regurgitation.  4. The aortic valve is tricuspid. Aortic valve regurgitation is not visualized. No aortic  stenosis is present.  5. The inferior vena cava is normal in size with greater than 50% respiratory variability, suggesting right atrial pressure of 3 mmHg. Comparison(s): No prior Echocardiogram. FINDINGS  Left Ventricle: Left ventricular ejection fraction, by estimation, is 60 to 65%. The left ventricle has normal function. The left ventricle has no regional wall motion abnormalities. The left ventricular internal cavity size was normal in size. There is  no left ventricular hypertrophy. Left ventricular diastolic parameters were normal. Right Ventricle: The right ventricular size is normal. No increase in right ventricular wall thickness. Right ventricular systolic function is normal. There is normal pulmonary artery systolic pressure. The tricuspid regurgitant velocity is 2.38 m/s, and  with an assumed right atrial pressure of 3 mmHg, the estimated right ventricular systolic pressure is 63.0 mmHg. Left Atrium: Left atrial size was normal in size. Right Atrium: Right atrial size was normal in size. Pericardium: There is no evidence of pericardial effusion. Mitral Valve: The mitral valve is normal in structure. Trivial mitral valve regurgitation. MV peak gradient, 3.2 mmHg. The  mean mitral valve gradient is 1.0 mmHg. Tricuspid Valve: The tricuspid valve is normal in structure. Tricuspid valve regurgitation is trivial. Aortic Valve: The aortic valve is tricuspid. Aortic valve regurgitation is not visualized. No aortic stenosis is present. Aortic valve mean gradient measures 2.0 mmHg. Aortic valve peak gradient measures 4.2 mmHg. Aortic valve area, by VTI measures 2.99 cm. Pulmonic Valve: The pulmonic valve was normal in structure. Pulmonic valve regurgitation is trivial. Aorta: The aortic root is normal in size and structure. Venous: The inferior vena cava is normal in size with greater than 50% respiratory variability, suggesting right atrial pressure of 3 mmHg. IAS/Shunts: The atrial septum is grossly normal.  LEFT VENTRICLE PLAX 2D LVIDd:         5.00 cm      Diastology LVIDs:         2.80 cm      LV e' medial:    12.60 cm/s LV PW:         1.00 cm      LV E/e' medial:  5.8 LV IVS:        1.20 cm      LV e' lateral:   20.00 cm/s LVOT diam:     2.10 cm      LV E/e' lateral: 3.7 LV SV:         74 LV SV Index:   40 LVOT Area:     3.46 cm  LV Volumes (MOD) LV vol d, MOD A2C: 125.0 ml LV vol d, MOD A4C: 96.6 ml LV vol s, MOD A2C: 41.6 ml LV vol s, MOD A4C: 29.5 ml LV SV MOD A2C:     83.4 ml LV SV MOD A4C:     96.6 ml LV SV MOD BP:      78.7 ml RIGHT VENTRICLE RV Basal diam:  3.80 cm RV Mid diam:    3.10 cm RV S prime:     12.40 cm/s TAPSE (M-mode): 2.4 cm LEFT ATRIUM             Index        RIGHT ATRIUM           Index LA diam:        3.90 cm 2.09 cm/m   RA Area:     17.40 cm LA Vol (A2C):   76.1 ml 40.79 ml/m  RA Volume:   49.80 ml  26.70 ml/m LA  Vol (A4C):   38.1 ml 20.42 ml/m LA Biplane Vol: 56.5 ml 30.29 ml/m  AORTIC VALVE                    PULMONIC VALVE AV Area (Vmax):    3.23 cm     PV Vmax:       0.83 m/s AV Area (Vmean):   2.96 cm     PV Peak grad:  2.7 mmHg AV Area (VTI):     2.99 cm AV Vmax:           102.00 cm/s AV Vmean:          69.700 cm/s AV VTI:            0.247 m  AV Peak Grad:      4.2 mmHg AV Mean Grad:      2.0 mmHg LVOT Vmax:         95.00 cm/s LVOT Vmean:        59.600 cm/s LVOT VTI:          0.213 m LVOT/AV VTI ratio: 0.86  AORTA Ao Root diam: 3.40 cm MITRAL VALVE               TRICUSPID VALVE MV Area (PHT): 2.80 cm    TR Peak grad:   22.7 mmHg MV Area VTI:   2.19 cm    TR Vmax:        238.00 cm/s MV Peak grad:  3.2 mmHg MV Mean grad:  1.0 mmHg    SHUNTS MV Vmax:       0.90 m/s    Systemic VTI:  0.21 m MV Vmean:      39.0 cm/s   Systemic Diam: 2.10 cm MV Decel Time: 271 msec MV E velocity: 73.70 cm/s MV A velocity: 30.80 cm/s MV E/A ratio:  2.39 Gwyndolyn Kaufman MD Electronically signed by Gwyndolyn Kaufman MD Signature Date/Time: 03/24/2022/11:35:41 AM    Final    US Carotid Bilateral  Result Date: 03/24/2022 CLINICAL DATA:  Syncope and collapse while exercising Hypertension Former tobacco use EXAM: BILATERAL CAROTID DUPLEX ULTRASOUND TECHNIQUE: Pearline Cables scale imaging, color Doppler and duplex ultrasound were performed of bilateral carotid and vertebral arteries in the neck. COMPARISON:  None available FINDINGS: Criteria: Quantification of carotid stenosis is based on velocity parameters that correlate the residual internal carotid diameter with NASCET-based stenosis levels, using the diameter of the distal internal carotid lumen as the denominator for stenosis measurement. The following velocity measurements were obtained: RIGHT ICA: 97/43 cm/sec CCA: 836/62 cm/sec SYSTOLIC ICA/CCA RATIO:  0.8 ECA: 101 cm/sec LEFT ICA: 84/23 cm/sec CCA: 94/76 cm/sec SYSTOLIC ICA/CCA RATIO:  0.9 ECA: 74 cm/sec RIGHT CAROTID ARTERY: No significant atheromatous plaque. RIGHT VERTEBRAL ARTERY:  Antegrade flow. LEFT CAROTID ARTERY:  No significant atheromatous plaque. LEFT VERTEBRAL ARTERY:  Antegrade flow. IMPRESSION: No significant stenosis of internal carotid arteries. Electronically Signed   By: Miachel Roux M.D.   On: 03/24/2022 09:58   DG Chest Portable 1 View  Result Date:  03/23/2022 CLINICAL DATA:  Chest pain EXAM: PORTABLE CHEST 1 VIEW COMPARISON:  Chest x-ray dated March 22, 2022 FINDINGS: The heart size and mediastinal contours are within normal limits. Both lungs are clear. The visualized skeletal structures are unremarkable. IMPRESSION: No active disease. Electronically Signed   By: Yetta Glassman M.D.   On: 03/23/2022 15:33   DG Chest 2 View  Result Date: 03/22/2022 CLINICAL DATA:  chest pain EXAM: CHEST - 2 VIEW COMPARISON:  Chest x-ray 08/26/2021 FINDINGS: The heart and mediastinal contours are within normal limits. Likely nipple shadow overlying the lower left lobe. No focal consolidation. No pulmonary edema. No pleural effusion. No pneumothorax. No acute osseous abnormality. IMPRESSION: 1. No active cardiopulmonary disease. 2. Likely nipple shadow overlying the lower left lobe. Consider repeat frontal view with nipple markers. Electronically Signed   By: Iven Finn M.D.   On: 03/22/2022 19:45     Subjective: No events/issues overnight   Discharge Exam: Vitals:   03/26/22 0849 03/26/22 1258  BP: 114/72 113/78  Pulse: 60 72  Resp:    Temp: 98.3 F (36.8 C) 98.9 F (37.2 C)  SpO2: 98% 99%   Vitals:   03/26/22 0011 03/26/22 0407 03/26/22 0849 03/26/22 1258  BP: 123/71 112/67 114/72 113/78  Pulse: (!) 51 (!) 55 60 72  Resp: 18 16    Temp: 98.1 F (36.7 C) (!) 97.5 F (36.4 C) 98.3 F (36.8 C) 98.9 F (37.2 C)  TempSrc: Oral Oral Oral Oral  SpO2:   98% 99%  Weight:      Height:        General: Pt is alert, awake, not in acute distress Cardiovascular: RRR, S1/S2 +, no rubs, no gallops Respiratory: CTA bilaterally, no wheezing, no rhonchi Abdominal: Soft, NT, ND, bowel sounds + Extremities: no edema, no cyanosis    The results of significant diagnostics from this hospitalization (including imaging, microbiology, ancillary and laboratory) are listed below for reference.     Microbiology: No results found for this or any previous  visit (from the past 240 hour(s)).   Labs: BNP (last 3 results) No results for input(s): "BNP" in the last 8760 hours. Basic Metabolic Panel: Recent Labs  Lab 03/22/22 1907 03/23/22 1519 03/24/22 0441 03/26/22 0209  NA 135 137 139 140  K 3.5 4.1 4.3 4.7  CL 104 104 106 106  CO2 '23 27 28 27  '$ GLUCOSE 118* 99 99 98  BUN 22* 22* 23* 14  CREATININE 0.98 0.96 0.98 1.10  CALCIUM 9.6 9.2 9.3 9.5  MG  --   --  2.2  --   PHOS  --   --  4.2  --    Liver Function Tests: Recent Labs  Lab 03/23/22 1519 03/24/22 0441  AST 26 23  ALT 27 26  ALKPHOS 39 37*  BILITOT 0.6 0.5  PROT 6.5 6.4*  ALBUMIN 4.0 4.0   Recent Labs  Lab 03/23/22 1519  LIPASE 29   No results for input(s): "AMMONIA" in the last 168 hours. CBC: Recent Labs  Lab 03/22/22 1907 03/23/22 1519 03/24/22 0441  WBC 8.8 6.2 6.3  HGB 15.1 14.9 15.4  HCT 43.7 44.3 47.5  MCV 92.6 94.5 96.9  PLT 244 237 229   Cardiac Enzymes: No results for input(s): "CKTOTAL", "CKMB", "CKMBINDEX", "TROPONINI" in the last 168 hours. BNP: Invalid input(s): "POCBNP" CBG: Recent Labs  Lab 03/23/22 1517 03/25/22 1134 03/25/22 1635  GLUCAP 105* 91 113*   D-Dimer No results for input(s): "DDIMER" in the last 72 hours. Hgb A1c No results for input(s): "HGBA1C" in the last 72 hours. Lipid Profile No results for input(s): "CHOL", "HDL", "LDLCALC", "TRIG", "CHOLHDL", "LDLDIRECT" in the last 72 hours. Thyroid function studies No results for input(s): "TSH", "T4TOTAL", "T3FREE", "THYROIDAB" in the last 72 hours.  Invalid input(s): "FREET3" Anemia work up No results for input(s): "VITAMINB12", "FOLATE", "FERRITIN", "TIBC", "IRON", "RETICCTPCT" in the last 72 hours. Urinalysis    Component Value Date/Time  COLORURINE COLORLESS (A) 01/03/2022 1845   APPEARANCEUR CLEAR 01/03/2022 1845   LABSPEC 1.002 (L) 01/03/2022 1845   PHURINE 7.0 01/03/2022 1845   GLUCOSEU NEGATIVE 01/03/2022 1845   HGBUR NEGATIVE 01/03/2022 1845    BILIRUBINUR NEGATIVE 01/03/2022 1845   KETONESUR 5 (A) 01/03/2022 1845   PROTEINUR NEGATIVE 01/03/2022 1845   UROBILINOGEN 0.2 04/25/2015 2350   NITRITE NEGATIVE 01/03/2022 1845   LEUKOCYTESUR NEGATIVE 01/03/2022 1845   Sepsis Labs Recent Labs  Lab 03/22/22 1907 03/23/22 1519 03/24/22 0441  WBC 8.8 6.2 6.3   Microbiology No results found for this or any previous visit (from the past 240 hour(s)).   Time coordinating discharge: Over 30 minutes  SIGNED:   Little Ishikawa, DO [facility] 03/26/2022, 4:36 PM Pager   If 7PM-7AM, please contact night-coverage [email or web address]

## 2022-03-26 NOTE — TOC Initial Note (Signed)
Transition of Care [facility]) - Initial/Assessment Note    Patient Details  Name: Logan Benton MRN: [id number] Date of Birth: 1980-12-15  Transition of Care [facility]) CM/SW Contact:    Pollie Friar, RN Phone Number: 03/26/2022, 11:43 AM  Clinical Narrative:                 Patient is from home with a roommate. He drives self as needed. He denies any issues with home medications.  Pt states his health insurance lapsed and he has not re-enrolled.  Pt denies issues affording to see PCP  or affording his medications.  TOC following for d/c needs.   Expected Discharge Plan: Home/Self Care Barriers to Discharge: Inadequate or no insurance, Continued Medical Work up   Patient Goals and CMS Choice        Expected Discharge Plan and Services Expected Discharge Plan: Home/Self Care   Discharge Planning Services: CM Consult   Living arrangements for the past 2 months: Single Family Home                                      Prior Living Arrangements/Services Living arrangements for the past 2 months: Single Family Home Lives with:: Roommate Patient language and need for interpreter reviewed:: Yes Do you feel safe going back to the place where you live?: Yes        Care giver support system in place?: No (comment)   Criminal Activity/Legal Involvement Pertinent to Current Situation/Hospitalization: No - Comment as needed  Activities of Daily Living Home Assistive Devices/Equipment: None ADL Screening (condition at time of admission) Patient's cognitive ability adequate to safely complete daily activities?: Yes Is the patient deaf or have difficulty hearing?: No Does the patient have difficulty seeing, even when wearing glasses/contacts?: No Does the patient have difficulty concentrating, remembering, or making decisions?: No Patient able to express need for assistance with ADLs?: No Does the patient have difficulty dressing or bathing?: No Independently performs ADLs?:  Yes (appropriate for developmental age) Does the patient have difficulty walking or climbing stairs?: No Weakness of Legs: None Weakness of Arms/Hands: None  Permission Sought/Granted                  Emotional Assessment Appearance:: Appears stated age Attitude/Demeanor/Rapport: Engaged Affect (typically observed): Accepting Orientation: : Oriented to Self, Oriented to Place, Oriented to  Time, Oriented to Situation   Psych Involvement: No (comment)  Admission diagnosis:  Chest pain [R07.9] Patient Active Problem List   Diagnosis Date Noted   Chest pain 03/23/2022   Near syncope 03/23/2022   Abdominal discomfort 01/07/2022   Irritation of oral cavity 07/24/2021   Acute pain of right shoulder 07/09/2021   Family history of renal failure 03/21/2021   Hyperthyroidism 03/21/2021   Insomnia 03/21/2021   Dyspepsia 02/27/2021   IBS (irritable bowel syndrome) 02/27/2021   Primary hypertension 02/26/2021   Oropharyngeal candidiasis 02/26/2021   Sciatica 02/26/2021   Barrett's esophagus 12/31/2020   Proctocolitis 06/13/2020   Gallbladder polyp 06/13/2020   PCP:  Lindell Spar, MD Pharmacy:   [facility] [address], [place] - [address] [id number] [id number] [place] [address] Phone: [phone] Fax: [phone]     Social Determinants of Health (SDOH) Interventions    Readmission Risk Interventions     No data to display

## 2022-03-26 NOTE — Progress Notes (Addendum)
   Logan Benton presented for a exercise treadmill test today.  No immediate complications.  Will touch base with EP regarding results to follow up with patient.   Reino Bellis, NP 03/26/2022, 10:47 AM

## 2022-03-26 NOTE — Progress Notes (Signed)
[facility] night cross cover note:   I was contacted by RN with request for clarification regarding current order for every 6 hours CBG monitoring.  Patient without history of diabetes, and is not undergoing any tube feeds and has no seizure history.  CBG monitoring over the course the last day showed blood sugars in the range of 91-113, and he has not required any associated sliding scale insulin.  I subsequently placed order to discontinue existing CBG monitoring.     Babs Bertin, DO Hospitalist

## 2022-03-27 ENCOUNTER — Encounter (HOSPITAL_COMMUNITY): Payer: Self-pay | Admitting: Cardiology

## 2022-03-28 ENCOUNTER — Telehealth: Payer: Self-pay

## 2022-04-02 DIAGNOSIS — B839 Helminthiasis, unspecified: Secondary | ICD-10-CM

## 2022-04-03 ENCOUNTER — Telehealth: Payer: Self-pay

## 2022-04-03 ENCOUNTER — Other Ambulatory Visit: Payer: Self-pay

## 2022-04-03 DIAGNOSIS — B839 Helminthiasis, unspecified: Secondary | ICD-10-CM

## 2022-04-03 NOTE — Telephone Encounter (Signed)
PT LMOVM stating he sent both of you a myChart message. States he wants you to take a look at it sometime this week

## 2022-04-03 NOTE — Telephone Encounter (Signed)
Noted   Lab form released

## 2022-04-03 NOTE — Telephone Encounter (Signed)
Noted and lab form released

## 2022-04-03 NOTE — Telephone Encounter (Signed)
Dena: I ordered a test through [facility] for patient. Please send to [facility]. Thanks!

## 2022-04-03 NOTE — Telephone Encounter (Signed)
Noted. I addressed. Needs labs faxed to [facility]. I ordered, just needs released.

## 2022-04-06 ENCOUNTER — Ambulatory Visit
Admission: EM | Admit: 2022-04-06 | Discharge: 2022-04-06 | Disposition: A | Discharge status: Home / Self Care | Payer: 59 | Attending: Family Medicine | Admitting: Family Medicine

## 2022-04-06 ENCOUNTER — Other Ambulatory Visit: Payer: Self-pay

## 2022-04-06 ENCOUNTER — Encounter: Payer: Self-pay | Admitting: Emergency Medicine

## 2022-04-06 DIAGNOSIS — S39011A Strain of muscle, fascia and tendon of abdomen, initial encounter: Secondary | ICD-10-CM

## 2022-04-06 MED ORDER — CYCLOBENZAPRINE HCL 10 MG PO TABS: 10.0000 mg | ORAL_TABLET | Freq: Three times a day (TID) | ORAL | 0 refills | Status: DC | PRN

## 2022-04-06 NOTE — ED Triage Notes (Addendum)
Pt reports RLQ abd pain after lifting weights on Thursday night. Pt reports since Friday has had intermittent bruising to site and states "tenderness with small hard knots under skin."

## 2022-04-06 NOTE — ED Provider Notes (Signed)
[facility]    CSN: [id number] Arrival date & time: 04/06/22  1011      History   Chief Complaint Chief Complaint  Patient presents with   Abdominal Pain    HPI Logan Benton is a 41 y.o. male.   Presenting today with 2-day history of right lower quadrant superficial bruising, hard painful knot that seem to come on the morning after a very strenuous workout.  He denies nausea, vomiting, diarrhea, constipation, new foods or medications, chronic GI issues.  No known history of any hernias or other previous abdominal trauma.  So far not trying anything over-the-counter for symptoms.   Past Medical History:  Diagnosis Date   Abscessed tooth    Anxiety    C. difficile diarrhea    presumptive diagnosis   Depression    Hypertension    Trigger finger     Patient Active Problem List   Diagnosis Date Noted   Chest pain 03/23/2022   Near syncope 03/23/2022   Abdominal discomfort 01/07/2022   Irritation of oral cavity 07/24/2021   Acute pain of right shoulder 07/09/2021   Family history of renal failure 03/21/2021   Hyperthyroidism 03/21/2021   Insomnia 03/21/2021   Dyspepsia 02/27/2021   IBS (irritable bowel syndrome) 02/27/2021   Primary hypertension 02/26/2021   Oropharyngeal candidiasis 02/26/2021   Sciatica 02/26/2021   Barrett's esophagus 12/31/2020   Proctocolitis 06/13/2020   Gallbladder polyp 06/13/2020    Past Surgical History:  Procedure Laterality Date   BILATERAL CARPAL TUNNEL RELEASE     BIOPSY  06/29/2020   Procedure: BIOPSY;  Surgeon: Eloise Harman, DO;  Location: [facility];  Service: Endoscopy;;   BIOPSY  09/03/2020   Procedure: BIOPSY;  Surgeon: Eloise Harman, DO;  Location: [facility];  Service: Endoscopy;;   BIOPSY  03/08/2021   Procedure: BIOPSY;  Surgeon: Eloise Harman, DO;  Location: [facility];  Service: Endoscopy;;   BIOPSY  05/10/2021   Procedure: BIOPSY;  Surgeon: Eloise Harman, DO;  [facility];  Service: Endoscopy;;   COLONOSCOPY WITH PROPOFOL N/A 06/29/2020   Dr. Abbey Chatters: 4 mm tubular adenoma removed, nonbleeding internal hemorrhoids.  Repeat colonoscopy in 7 years.   COLONOSCOPY WITH PROPOFOL N/A 05/10/2021   Procedure: COLONOSCOPY WITH PROPOFOL;  Surgeon: Eloise Harman, DO;  Location: [facility];  Service: Endoscopy;  Laterality: N/A;  1:15pm   ESOPHAGOGASTRODUODENOSCOPY (EGD) WITH PROPOFOL N/A 06/29/2020   Dr. Abbey Chatters: LA grade C esophagitis, gastritis with mild chronic gastritis on path, small bowel biopsy unremarkable.   ESOPHAGOGASTRODUODENOSCOPY (EGD) WITH PROPOFOL N/A 09/03/2020   Dr. Abbey Chatters: Esophageal mucosal changes suggestive of short segment Barrett's esophagus, biopsies consistent.  Next EGD in 3 years   ESOPHAGOGASTRODUODENOSCOPY (EGD) WITH PROPOFOL N/A 03/08/2021   Procedure: ESOPHAGOGASTRODUODENOSCOPY (EGD) WITH PROPOFOL;  Surgeon: Eloise Harman, DO;  Location: [facility];  Service: Endoscopy;  Laterality: N/A;  1:00pm   LOOP RECORDER INSERTION N/A 03/26/2022   Procedure: LOOP RECORDER INSERTION;  Surgeon: Constance Haw, MD;  Location: [facility];  Service: Cardiovascular;  Laterality: N/A;   POLYPECTOMY  06/29/2020   Procedure: POLYPECTOMY;  Surgeon: Eloise Harman, DO;  Location: [facility];  Service: Endoscopy;;   SHOULDER SURGERY Right    x2   TRIGGER FINGER RELEASE Right 2016     Home Medications    Prior to Admission medications   Medication Sig Start Date End Date Taking? Authorizing Provider  cyclobenzaprine (FLEXERIL) 10 MG tablet Take 1 tablet (10 mg total) by mouth 3 (three) times daily as needed for muscle spasms. Do not drink alcohol or drive while taking this medication.  May cause drowsiness. 04/06/22  Yes Volney American, PA-C  Ascorbic Acid (VITAMIN C PO) Take 1 tablet by mouth daily.    [provider]  aspirin EC 81 MG tablet Take 1 tablet (81 mg total) by mouth daily. Swallow whole. 03/27/22    Little Ishikawa, MD  metoprolol succinate (TOPROL-XL) 25 MG 24 hr tablet Take 1 tablet (25 mg total) by mouth daily. 03/27/22   Little Ishikawa, MD  Multiple Vitamin (MULTIVITAMIN WITH MINERALS) TABS tablet Take 1 tablet by mouth daily.    [provider]  omeprazole (PRILOSEC) 20 MG capsule Take 1 capsule (20 mg total) by mouth every morning. 03/06/22   Lindell Spar, MD  Probiotic Product (PROBIOTIC-10 PO) Take 1 capsule by mouth daily.    [provider]   Family History Family History  Problem Relation Age of Onset   Rheum arthritis Mother    Bladder Cancer Mother    Kidney disease Father    Bladder Cancer Maternal Grandfather    Irritable bowel syndrome Paternal Grandmother    Colon cancer Neg Hx    Celiac disease Neg Hx    Inflammatory bowel disease Neg Hx    Social History Social History   Tobacco Use   Smoking status: Former    Packs/day: 0.00    Years: 0.00    Total pack years: 0.00    Types: Cigarettes    Quit date: 11/03/2019    Years since quitting: 2.4    Passive exposure: Current   Smokeless tobacco: Never   Tobacco comments:    nicoten gum  Vaping Use   Vaping Use: Former   Quit date: 03/07/2019  Substance Use Topics   Alcohol use: No   Drug use: Yes    Types: Marijuana    Allergies   Patient has no known allergies.   Review of Systems Review of Systems Per HPI  Physical Exam Triage Vital Signs ED Triage Vitals [04/06/22 1022]  Enc Vitals Group     BP 138/76     Pulse Rate 70     Resp 18     Temp 98.8 F (37.1 C)     Temp Source Oral     SpO2 95 %     Weight      Height      Head Circumference      Peak Flow      Pain Score 6     Pain Loc      Pain Edu?      Excl. in [place]?    No data found.  Updated Vital Signs BP 138/76 (BP Location: Right Arm)   Pulse 70   Temp 98.8 F (37.1 C) (Oral)   Resp 18   SpO2 95%   Visual Acuity Right Eye Distance:   Left Eye Distance:   Bilateral Distance:    Right  Eye Near:   Left Eye Near:    Bilateral Near:     Physical Exam Vitals and nursing note reviewed.  Constitutional:      Appearance: Normal appearance. He is not ill-appearing or diaphoretic.  HENT:     Head: Atraumatic.     Mouth/Throat:     Mouth: Mucous membranes are moist.     Pharynx: Oropharynx is clear.  Eyes:  Extraocular Movements: Extraocular movements intact.     Conjunctiva/sclera: Conjunctivae normal.  Cardiovascular:     Heart sounds: Normal heart sounds.  Abdominal:     General: Bowel sounds are normal. There is no distension.     Palpations: Abdomen is soft.     Tenderness: There is abdominal tenderness. There is no right CVA tenderness, left CVA tenderness or guarding.     Comments: Superficial tenderness to palpation right lower quadrant over area of bruising.  Firm, painful nodular lesion in the area of bruising.  Difficult to discern if hernia defect  Musculoskeletal:        General: Normal range of motion.     Cervical back: Normal range of motion and neck supple.  Skin:    General: Skin is warm.     Findings: Bruising present.  Neurological:     Mental Status: He is alert.     Motor: No weakness.     Gait: Gait normal.  Psychiatric:        Mood and Affect: Mood normal.        Thought Content: Thought content normal.        Judgment: Judgment normal.      UC Treatments / Results  Labs (all labs ordered are listed, but only abnormal results are displayed) Labs Reviewed - No data to display  EKG   Radiology No results found.  Procedures Procedures (including critical care time)  Medications Ordered in UC Medications - No data to display  Initial Impression / Assessment and Plan / UC Course  I have reviewed the triage vital signs and the nursing notes.  Pertinent labs & imaging results that were available during my care of the patient were reviewed by me and considered in my medical decision making (see chart for details).      Suspect muscle strain, less likely hernia though without ultrasound unable to fully rule out.  We will treat with Flexeril, lidocaine patches, heat, rest and follow-up with PCP for further evaluation if not significantly improving.  Return for any worsening symptoms at any time.  Final Clinical Impressions(s) / UC Diagnoses   Final diagnoses:  Strain of abdominal muscle, initial encounter   Discharge Instructions   None    ED Prescriptions     Medication Sig Dispense Auth. Provider   cyclobenzaprine (FLEXERIL) 10 MG tablet Take 1 tablet (10 mg total) by mouth 3 (three) times daily as needed for muscle spasms. Do not drink alcohol or drive while taking this medication.  May cause drowsiness. 15 tablet Volney American, [place]      PDMP not reviewed this encounter.   Volney American, [place] 04/06/22 1054

## 2022-04-08 ENCOUNTER — Emergency Department (HOSPITAL_COMMUNITY)
Admission: EM | Admit: 2022-04-08 | Discharge: 2022-04-08 | Disposition: A | Discharge status: Home / Self Care | Payer: 59 | Attending: Emergency Medicine | Admitting: Emergency Medicine

## 2022-04-08 ENCOUNTER — Encounter (HOSPITAL_COMMUNITY): Payer: Self-pay | Admitting: Emergency Medicine

## 2022-04-08 ENCOUNTER — Other Ambulatory Visit: Payer: Self-pay

## 2022-04-08 ENCOUNTER — Emergency Department (HOSPITAL_COMMUNITY): Payer: 59

## 2022-04-08 DIAGNOSIS — N1330 Unspecified hydronephrosis: Secondary | ICD-10-CM | POA: Diagnosis not present

## 2022-04-08 DIAGNOSIS — R197 Diarrhea, unspecified: Secondary | ICD-10-CM | POA: Diagnosis not present

## 2022-04-08 DIAGNOSIS — R110 Nausea: Secondary | ICD-10-CM | POA: Insufficient documentation

## 2022-04-08 DIAGNOSIS — R1031 Right lower quadrant pain: Secondary | ICD-10-CM | POA: Insufficient documentation

## 2022-04-08 DIAGNOSIS — Z7982 Long term (current) use of aspirin: Secondary | ICD-10-CM | POA: Insufficient documentation

## 2022-04-08 DIAGNOSIS — N3289 Other specified disorders of bladder: Secondary | ICD-10-CM | POA: Diagnosis not present

## 2022-04-08 LAB — COMPREHENSIVE METABOLIC PANEL
ALT: 25 U/L (ref 0–44)
AST: 21 U/L (ref 15–41)
Albumin: 4.1 g/dL (ref 3.5–5.0)
Alkaline Phosphatase: 38 U/L (ref 38–126)
Anion gap: 6 (ref 5–15)
BUN: 13 mg/dL (ref 6–20)
CO2: 30 mmol/L (ref 22–32)
Calcium: 9.6 mg/dL (ref 8.9–10.3)
Chloride: 101 mmol/L (ref 98–111)
Creatinine, Ser: 0.94 mg/dL (ref 0.61–1.24)
GFR, Estimated: >60 mL/min (ref >60)
Glucose, Bld: 103 mg/dL — ABNORMAL HIGH (ref 70–99)
Potassium: 4.1 mmol/L (ref 3.5–5.1)
Sodium: 137 mmol/L (ref 135–145)
Total Bilirubin: 0.7 mg/dL (ref 0.3–1.2)
Total Protein: 6.6 g/dL (ref 6.5–8.1)

## 2022-04-08 LAB — CBC WITH DIFFERENTIAL/PLATELET
Abs Immature Granulocytes: 0.02 10*3/uL (ref 0.00–0.07)
Basophils Absolute: 0 10*3/uL (ref 0.0–0.1)
Basophils Relative: 1 %
Eosinophils Absolute: 0 10*3/uL (ref 0.0–0.5)
Eosinophils Relative: 1 %
HCT: 43.5 % (ref 39.0–52.0)
Hemoglobin: 14.3 g/dL (ref 13.0–17.0)
Immature Granulocytes: 0 %
Lymphocytes Relative: 21 %
Lymphs Abs: 1.5 10*3/uL (ref 0.7–4.0)
MCH: 31.4 pg (ref 26.0–34.0)
MCHC: 32.9 g/dL (ref 30.0–36.0)
MCV: 95.4 fL (ref 80.0–100.0)
Monocytes Absolute: 0.7 10*3/uL (ref 0.1–1.0)
Monocytes Relative: 9 %
Neutro Abs: 4.9 10*3/uL (ref 1.7–7.7)
Neutrophils Relative %: 68 %
Platelets: 233 10*3/uL (ref 150–400)
RBC: 4.56 MIL/uL (ref 4.22–5.81)
RDW: 13.2 % (ref 11.5–15.5)
WBC: 7.2 10*3/uL (ref 4.0–10.5)
nRBC: 0 % (ref 0.0–0.2)

## 2022-04-08 LAB — CK: Total CK: 96 U/L (ref 49–397)

## 2022-04-08 LAB — URINALYSIS, ROUTINE W REFLEX MICROSCOPIC
Bilirubin Urine: NEGATIVE
Glucose, UA: NEGATIVE mg/dL
Hgb urine dipstick: NEGATIVE
Ketones, ur: NEGATIVE mg/dL
Leukocytes,Ua: NEGATIVE
Nitrite: NEGATIVE
Protein, ur: NEGATIVE mg/dL
Specific Gravity, Urine: 1.003 — ABNORMAL LOW (ref 1.005–1.030)
pH: 8 (ref 5.0–8.0)

## 2022-04-08 LAB — LIPASE, BLOOD: Lipase: 34 U/L (ref 11–51)

## 2022-04-08 MED ORDER — IOHEXOL 300 MG/ML  SOLN
100.0000 mL | Freq: Once | INTRAMUSCULAR | Status: AC | PRN
  Administered 2022-04-08: 100 mL via INTRAVENOUS

## 2022-04-08 MED ORDER — FENTANYL CITRATE PF 50 MCG/ML IJ SOSY
50.0000 ug | PREFILLED_SYRINGE | Freq: Once | INTRAMUSCULAR | Status: AC
  Administered 2022-04-08: 50 ug via INTRAVENOUS
  Filled 2022-04-08: qty 1

## 2022-04-08 NOTE — ED Triage Notes (Signed)
C/o abdominal pain Friday morning. Denies any injuries. Did a workout Thursday night. Pt noticed a bruise that was larger than it is now RLQ. Where pt states abdominal pain is. Pt states has nausea with diarrhea. States normally has loose stools.

## 2022-04-08 NOTE — Discharge Instructions (Addendum)
This is likely muscular injury from the strenuous work-related on Thursday.  No evidence of infection or blockages in your intestines or gut.  I would like you to follow-up with your primary care doctor for further evaluation.  Please return to the emergency room for any worsening symptoms you might have.

## 2022-04-08 NOTE — ED Provider Notes (Signed)
[facility] EMERGENCY DEPARTMENT Provider Note   CSN: [id number] Arrival date & time: 04/08/22  1031     History No chief complaint on file.   Logan Benton is a 41 y.o. male who presents to the emergency department for further evaluation of right lower quadrant abdominal pain.  Patient was seen evaluated urgent care 2 days ago for similar symptoms.  Upon reviewing their note, patient states that he had a very strenuous workout the night before pain started.  He woke up the next morning with bruising in the right lower quadrant followed by pain.  He has been taking Tylenol with little relief.  Further chart review revealed that this was likely a muscular strain and he was treated conservatively with muscle relaxers, lidocaine patches, and Tylenol.  Patient has been doing this regimen for 2 days and states pain is now worse and more diffuse over the right lower quadrant.  He reports associated nausea and diarrhea.  No urinary complaints, fever, chills.  HPI     Home Medications Prior to Admission medications   Medication Sig Start Date End Date Taking? Authorizing Provider  aspirin EC 81 MG tablet Take 1 tablet (81 mg total) by mouth daily. Swallow whole. 03/27/22  Yes Azucena Fallen, MD  metoprolol succinate (TOPROL-XL) 25 MG 24 hr tablet Take 1 tablet (25 mg total) by mouth daily. 03/27/22  Yes Azucena Fallen, MD  omeprazole (PRILOSEC) 20 MG capsule Take 1 capsule (20 mg total) by mouth every morning. 03/06/22  Yes Anabel Halon, MD  cyclobenzaprine (FLEXERIL) 10 MG tablet Take 1 tablet (10 mg total) by mouth 3 (three) times daily as needed for muscle spasms. Do not drink alcohol or drive while taking this medication.  May cause drowsiness. Patient not taking: Reported on 04/08/2022 04/06/22   Particia Nearing, PA-C      Allergies    Patient has no known allergies.    Review of Systems   Review of Systems  All other systems reviewed and are negative.   Physical  Exam Updated Vital Signs BP 116/69   Pulse 66   Temp 98.7 F (37.1 C) (Oral)   Resp 16   Ht 5\' 11"  (1.803 m)   Wt 70.3 kg   SpO2 97%   BMI 21.62 kg/m  Physical Exam Vitals and nursing note reviewed.  Constitutional:      General: He is not in acute distress.    Appearance: Normal appearance.  HENT:     Head: Normocephalic and atraumatic.  Eyes:     General:        Right eye: No discharge.        Left eye: No discharge.  Cardiovascular:     Comments: Regular rate and rhythm.  S1/S2 are distinct without any evidence of murmur, rubs, or gallops.  Radial pulses are 2+ bilaterally.  Dorsalis pedis pulses are 2+ bilaterally.  No evidence of pedal edema. Pulmonary:     Comments: Clear to auscultation bilaterally.  Normal effort.  No respiratory distress.  No evidence of wheezes, rales, or rhonchi heard throughout. Abdominal:     General: Abdomen is flat. Bowel sounds are normal. There is no distension.     Tenderness: There is abdominal tenderness in the right lower quadrant. There is no guarding or rebound.     Comments: Healing ecchymosis over the right lower quadrant.  No evidence of Grey Turner's or Cullen sign.  Musculoskeletal:        General:  Normal range of motion.     Cervical back: Neck supple.  Skin:    General: Skin is warm and dry.     Findings: No rash.  Neurological:     General: No focal deficit present.     Mental Status: He is alert.  Psychiatric:        Mood and Affect: Mood normal.        Behavior: Behavior normal.     ED Results / Procedures / Treatments   Labs (all labs ordered are listed, but only abnormal results are displayed) Labs Reviewed  COMPREHENSIVE METABOLIC PANEL - Abnormal; Notable for the following components:      Result Value   Glucose, Bld 103 (*)    All other components within normal limits  URINALYSIS, ROUTINE W REFLEX MICROSCOPIC - Abnormal; Notable for the following components:   Color, Urine STRAW (*)    Specific Gravity,  Urine 1.003 (*)    All other components within normal limits  CBC WITH DIFFERENTIAL/PLATELET  LIPASE, BLOOD  CK    EKG None  Radiology CT ABDOMEN PELVIS W CONTRAST  Result Date: 04/08/2022 CLINICAL DATA:  Right lower quadrant pain with nausea and diarrhea for 4 days EXAM: CT ABDOMEN AND PELVIS WITH CONTRAST TECHNIQUE: Multidetector CT imaging of the abdomen and pelvis was performed using the standard protocol following bolus administration of intravenous contrast. RADIATION DOSE REDUCTION: This exam was performed according to the departmental dose-optimization program which includes automated exposure control, adjustment of the mA and/or kV according to patient size and/or use of iterative reconstruction technique. CONTRAST:  OMNIPAQUE IOHEXOL 300 MG/ML  SOLN COMPARISON:  04/10/2021, 04/16/2020 FINDINGS: Lower chest: No acute abnormality. Hepatobiliary: No focal liver abnormality. Gallbladder wall appears thickened, likely related to partially contracted state. No hyperdense gallstone. No pericholecystic inflammatory changes are evident by CT. Pancreas: Unremarkable. No pancreatic ductal dilatation or surrounding inflammatory changes. Spleen: Normal in size without focal abnormality. Adrenals/Urinary Tract: Unremarkable adrenal glands. Kidneys enhance symmetrically. No focal renal lesion or renal stone. Mild bilateral hydroureteronephrosis. No ureteral calculi. Moderately distended urinary bladder. Stomach/Bowel: Stomach is within normal limits. Appendix appears normal (series 2, image 51). No evidence of bowel wall thickening, distention, or inflammatory changes. Vascular/Lymphatic: No significant vascular findings are present. No enlarged abdominal or pelvic lymph nodes. Reproductive: Prostate is unremarkable. Other: No free fluid. No abdominopelvic fluid collection. No pneumoperitoneum. No abdominal wall hernia. Musculoskeletal: No acute or significant osseous findings. IMPRESSION: 1. Mild  bilateral hydroureteronephrosis without ureteral calculi. Moderately distended urinary bladder. No urinary tract calculi are seen. Correlate for urinary retention. 2. Normal appendix. 3. Gallbladder wall appears thickened, likely related to partially contracted state. No pericholecystic inflammatory changes are evident by CT. If there is clinical concern for acute cholecystitis, further evaluation with right upper quadrant ultrasound could be performed. Electronically Signed   By: Duanne Guess D.O.   On: 04/08/2022 12:37    Procedures Procedures   Medications Ordered in ED Medications  iohexol (OMNIPAQUE) 300 MG/ML solution 100 mL (100 mLs Intravenous Contrast Given 04/08/22 1211)  fentaNYL (SUBLIMAZE) injection 50 mcg (50 mcg Intravenous Given 04/08/22 1219)    ED Course/ Medical Decision Making/ A&P Clinical Course as of 04/08/22 1331  Tue Apr 08, 2022  1323 On reevaluation, patient is feeling better and wants to go home.  I discussed all findings of the labs and imaging with him at the bedside.  I will have patient follow-up with primary care doctor for further evaluation. [CF]  1325  CBC with Differential Normal. [CF]  1326 Comprehensive metabolic panel(!) Normal. [CF]  1326 Lipase, blood Normal. [CF]  1326 CK Normal.  No evidence of rhabdomyolysis. [CF]  1326 Urinalysis, Routine w reflex microscopic Urine, Clean Catch(!) No signs of infection. [CF]    Clinical Course User Index [CF] Teressa Lower, PA-C                           Medical Decision Making Logan Benton is a 41 y.o. male patient who presents to the emergency department today for further evaluation of right lower quadrant abdominal pain with mild ecchymosis that appears healing.  Differential diagnosis includes musculoskeletal strain, soft tissue injury, appendicitis, kidney stone.   Amount and/or Complexity of Data Reviewed Labs: ordered. Decision-making details documented in ED Course. Radiology: ordered  and independent interpretation performed. Decision-making details documented in ED Course.  Risk Prescription drug management. Parenteral controlled substances. Risk Details: Patient feeling better after pain medication.  He voided over 1000 cc of urine here in the department.  This is likely muscular strain from working out.  No CT evidence of appendicitis or kidney stones.  I am going to have him follow-up with his primary care doctor for further evaluation.  Patient amount of this plan.  Strict turn precaution discussed.  He is safe for discharge.    Final Clinical Impression(s) / ED Diagnoses Final diagnoses:  Right lower quadrant abdominal pain    Rx / DC Orders ED Discharge Orders     None         Jolyn Lent 04/08/22 1331    Gloris Manchester, MD 04/09/22 [phone]

## 2022-04-15 ENCOUNTER — Encounter: Payer: Self-pay | Admitting: *Deleted

## 2022-04-15 ENCOUNTER — Ambulatory Visit (INDEPENDENT_AMBULATORY_CARE_PROVIDER_SITE_OTHER): Payer: 59 | Admitting: General Surgery

## 2022-04-15 ENCOUNTER — Encounter: Payer: Self-pay | Admitting: General Surgery

## 2022-04-15 VITALS — BP 110/72 | HR 51 | Temp 98.1°F | Resp 12 | Ht 71.0 in | Wt 156.0 lb

## 2022-04-15 DIAGNOSIS — K824 Cholesterolosis of gallbladder: Secondary | ICD-10-CM

## 2022-04-15 DIAGNOSIS — D369 Benign neoplasm, unspecified site: Secondary | ICD-10-CM | POA: Insufficient documentation

## 2022-04-15 NOTE — Patient Instructions (Addendum)
7/21, 8/18, 8/25 are Friday options for surgery. Plan on being out for 1 week after (going back the second Monday).   Minimally Invasive Cholecystectomy Minimally invasive cholecystectomy is surgery to remove the gallbladder. The gallbladder is a pear-shaped organ that lies beneath the liver on the right side of the body. The gallbladder stores bile, which is a fluid that helps the body digest fats. Cholecystectomy is often done to treat inflammation (irritation and swelling) of the gallbladder (cholecystitis). This condition is usually caused by a buildup of gallstones (cholelithiasis) in the gallbladder or when the fluid in the gall bladder becomes stagnant because gallstones get stuck in the ducts (tubes) and block the flow of bile. This can result in inflammation and pain. In severe cases, emergency surgery may be required. This procedure is done through small incisions in the abdomen, instead of one large incision. It is also called laparoscopic surgery. A thin scope with a camera (laparoscope) is inserted through one incision. Then surgical instruments are inserted through the other incisions. In some cases, a minimally invasive surgery may need to be changed to a surgery that is done through a larger incision. This is called open surgery. Tell a health care provider about: Any allergies you have. All medicines you are taking, including vitamins, herbs, eye drops, creams, and over-the-counter medicines. Any problems you or family members have had with anesthetic medicines. Any bleeding problems you have. Any surgeries you have had. Any medical conditions you have. Whether you are pregnant or may be pregnant. What are the risks? Generally, this is a safe procedure. However, problems may occur, including: Infection. Bleeding. Allergic reactions to medicines. Damage to nearby structures or organs. A gallstone remaining in the common bile duct. The common bile duct carries bile from the  gallbladder to the small intestine. A bile leak from the liver or cystic duct after your gallbladder is removed. What happens before the procedure? Medicines Ask your health care provider about: Changing or stopping your regular medicines. This is especially important if you are taking diabetes medicines or blood thinners. Taking medicines such as aspirin and ibuprofen. These medicines can thin your blood. Do not take these medicines unless your health care provider tells you to take them. Taking over-the-counter medicines, vitamins, herbs, and supplements. General instructions If you will be going home right after the procedure, plan to have a responsible adult: Take you home from the hospital or clinic. You will not be allowed to drive. Care for you for the time you are told. Do not use any products that contain nicotine or tobacco for at least 4 weeks before the procedure. These products include cigarettes, chewing tobacco, and vaping devices, such as e-cigarettes. If you need help quitting, ask your health care provider. Ask your health care provider: How your surgery site will be marked. What steps will be taken to help prevent infection. These may include: Removing hair at the surgery site. Washing skin with a germ-killing soap. Taking antibiotic medicine. What happens during the procedure?  An IV will be inserted into one of your veins. You will be given one or both of the following: A medicine to help you relax (sedative). A medicine to make you fall asleep (general anesthetic). Your surgeon will make several small incisions in your abdomen. The laparoscope will be inserted through one of the small incisions. The camera on the laparoscope will send images to a monitor in the operating room. This lets your surgeon see inside your abdomen. A gas will  be pumped into your abdomen. This will expand your abdomen to give the surgeon more room to perform the surgery. Other tools that are  needed for the procedure will be inserted through the other incisions. The gallbladder will be removed through one of the incisions. Your common bile duct may be examined. If stones are found in the common bile duct, they may be removed. After your gallbladder has been removed, the incisions will be closed with stitches (sutures), staples, or skin glue. Your incisions will be covered with a bandage (dressing). The procedure may vary among health care providers and hospitals. What happens after the procedure? Your blood pressure, heart rate, breathing rate, and blood oxygen level will be monitored until you leave the hospital or clinic. You will be given medicines as needed to control your pain. You may have a drain placed in the incision. The drain will be removed a day or two after the procedure. Summary Minimally invasive cholecystectomy, also called laparoscopic cholecystectomy, is surgery to remove the gallbladder using small incisions. Tell your health care provider about all the medical conditions you have and all the medicines you are taking for those conditions. Before the procedure, follow instructions about when to stop eating and drinking and changing or stopping medicines. Plan to have a responsible adult care for you for the time you are told after you leave the hospital or clinic. This information is not intended to replace advice given to you by your health care provider. Make sure you discuss any questions you have with your health care provider. Document Revised: 03/26/2021 Document Reviewed: 03/26/2021 Elsevier Patient Education  [employer].

## 2022-04-15 NOTE — Progress Notes (Signed)
[facility] History and Physical  Reason for Referral:*** Referring Physician: ***  Chief Complaint   New Patient (Initial Visit)     Logan Benton is a 41 y.o. male.  HPI: ***.  The *** started *** and has had a duration of ***.  It is associated with ***.  The *** is improved with ***, and is made worse with ***.    Quality*** Context***  Past Medical History:  Diagnosis Date  . Abscessed tooth   . Anxiety   . C. difficile diarrhea    presumptive diagnosis  . Depression   . Hypertension   . Trigger finger     Past Surgical History:  Procedure Laterality Date  . BILATERAL CARPAL TUNNEL RELEASE    . BIOPSY  06/29/2020   Procedure: BIOPSY;  Surgeon: Lanelle Bal, DO;  [facility];  Service: Endoscopy;;  . BIOPSY  09/03/2020   Procedure: BIOPSY;  Surgeon: Lanelle Bal, DO;  [facility];  Service: Endoscopy;;  . BIOPSY  03/08/2021   Procedure: BIOPSY;  Surgeon: Lanelle Bal, DO;  [facility];  Service: Endoscopy;;  . BIOPSY  05/10/2021   Procedure: BIOPSY;  Surgeon: Lanelle Bal, DO;  [facility];  Service: Endoscopy;;  . COLONOSCOPY WITH PROPOFOL N/A 06/29/2020   Dr. Marletta Lor: 4 mm tubular adenoma removed, nonbleeding internal hemorrhoids.  Repeat colonoscopy in 7 years.  . COLONOSCOPY WITH PROPOFOL N/A 05/10/2021   Procedure: COLONOSCOPY WITH PROPOFOL;  Surgeon: Lanelle Bal, DO;  [facility];  Service: Endoscopy;  Laterality: N/A;  1:15pm  . ESOPHAGOGASTRODUODENOSCOPY (EGD) WITH PROPOFOL N/A 06/29/2020   Dr. Marletta Lor: LA grade C esophagitis, gastritis with mild chronic gastritis on path, small bowel biopsy unremarkable.  . ESOPHAGOGASTRODUODENOSCOPY (EGD) WITH PROPOFOL N/A 09/03/2020   Dr. Marletta Lor: Esophageal mucosal changes suggestive of short segment Barrett's esophagus, biopsies consistent.  Next EGD in 3 years  . ESOPHAGOGASTRODUODENOSCOPY (EGD) WITH PROPOFOL N/A 03/08/2021    Procedure: ESOPHAGOGASTRODUODENOSCOPY (EGD) WITH PROPOFOL;  Surgeon: Lanelle Bal, DO;  [facility];  Service: Endoscopy;  Laterality: N/A;  1:00pm  . LOOP RECORDER INSERTION N/A 03/26/2022   Procedure: LOOP RECORDER INSERTION;  Surgeon: Regan Lemming, MD;  [facility];  Service: Cardiovascular;  Laterality: N/A;  . POLYPECTOMY  06/29/2020   Procedure: POLYPECTOMY;  Surgeon: Lanelle Bal, DO;  [facility];  Service: Endoscopy;;  . SHOULDER SURGERY Right    x2  . TRIGGER FINGER RELEASE Right 2016    Family History  Problem Relation Age of Onset  . Rheum arthritis Mother   . Bladder Cancer Mother   . Kidney disease Father   . Bladder Cancer Maternal Grandfather   . Irritable bowel syndrome Paternal Grandmother   . Colon cancer Neg Hx   . Celiac disease Neg Hx   . Inflammatory bowel disease Neg Hx     Social History   Tobacco Use  . Smoking status: Former    Packs/day: 0.00    Years: 0.00    Total pack years: 0.00    Types: Cigarettes    Quit date: 11/03/2019    Years since quitting: 2.4    Passive exposure: Current  . Smokeless tobacco: Never  . Tobacco comments:    nicoten gum  Vaping Use  . Vaping Use: Former  . Quit date: 03/07/2019  Substance Use Topics  . Alcohol use: No  . Drug  use: Yes    Types: Marijuana    Medications: {medication reviewed/display:3041432} Allergies as of 04/15/2022   No Known Allergies      Medication List        Accurate as of April 15, 2022 11:09 AM. If you have any questions, ask your nurse or doctor.          STOP taking these medications    cyclobenzaprine 10 MG tablet Commonly known as: FLEXERIL Stopped by: Lucretia Roers, MD       TAKE these medications    aspirin EC 81 MG tablet Take 1 tablet (81 mg total) by mouth daily. Swallow whole.   metoprolol succinate 25 MG 24 hr tablet Commonly known as: TOPROL-XL Take 1 tablet (25 mg total) by mouth daily.    omeprazole 20 MG capsule Commonly known as: PRILOSEC Take 1 capsule (20 mg total) by mouth every morning.         ROS:  {Review of Systems:30496}  Blood pressure 110/72, pulse (!) 51, temperature 98.1 F (36.7 C), temperature source Oral, resp. rate 12, height 5\' 11"  (1.803 m), weight 156 lb (70.8 kg), SpO2 98 %. Physical Exam  Results: No results found for this or any previous visit (from the past 48 hour(s)).  No results found.   Assessment & Plan:  Logan Benton is a 41 y.o. male with *** -*** -*** -Follow up ***  All questions were answered to the satisfaction of the patient and family***.  The risk and benefits of *** were discussed including but not limited to ***.  After careful consideration, Logan Benton has decided to ***.    Lucretia Roers 04/15/2022, 11:09 AM

## 2022-04-17 MED ORDER — ALBENDAZOLE 200 MG PO TABS: 400.0000 mg | ORAL_TABLET | Freq: Two times a day (BID) | ORAL | 0 refills | Status: AC

## 2022-04-17 NOTE — H&P (Signed)
[facility] History and Physical  Reason for Referral: Gallbladder polyps  Referring Physician: Lindell Spar, MD   Chief Complaint   New Patient (Initial Visit)     Logan Benton is a 41 y.o. male.  HPI:  Logan Benton is a 41 yo who has been having issues with epigastric pain and reported "burning and itching" in his intestines. He has family that has celiac disease and he has had endoscopies.  He was referred by ED after getting evaluated for RUQ / RLQ pain. He had some associated nausea ana diarrhea. He had a prior US that demonstrated potential 52m polyps. He had imaging with CT in the ED that showed a very contracted gallbladder but no evidence of cholecystitis at that time.     Past Medical History:  Diagnosis Date   Abscessed tooth    Anxiety    C. difficile diarrhea    presumptive diagnosis   Depression    Hypertension    Trigger finger     Past Surgical History:  Procedure Laterality Date   BILATERAL CARPAL TUNNEL RELEASE     BIOPSY  06/29/2020   Procedure: BIOPSY;  Surgeon: CEloise Harman DO;  [facility];  Service: Endoscopy;;   BIOPSY  09/03/2020   Procedure: BIOPSY;  Surgeon: CEloise Harman DO;  [facility];  Service: Endoscopy;;   BIOPSY  03/08/2021   Procedure: BIOPSY;  Surgeon: CEloise Harman DO;  [facility];  Service: Endoscopy;;   BIOPSY  05/10/2021   Procedure: BIOPSY;  Surgeon: CEloise Harman DO;  [facility];  Service: Endoscopy;;   COLONOSCOPY WITH PROPOFOL N/A 06/29/2020   Dr. CAbbey Chatters 4 mm tubular adenoma removed, nonbleeding internal hemorrhoids.  Repeat colonoscopy in 7 years.   COLONOSCOPY WITH PROPOFOL N/A 05/10/2021   Procedure: COLONOSCOPY WITH PROPOFOL;  Surgeon: CEloise Harman DO;  [facility];  Service: Endoscopy;  Laterality: N/A;  1:15pm   ESOPHAGOGASTRODUODENOSCOPY (EGD) WITH PROPOFOL N/A 06/29/2020   Dr. CAbbey Chatters LA grade C esophagitis, gastritis  with mild chronic gastritis on path, small bowel biopsy unremarkable.   ESOPHAGOGASTRODUODENOSCOPY (EGD) WITH PROPOFOL N/A 09/03/2020   Dr. CAbbey Chatters Esophageal mucosal changes suggestive of short segment Barrett's esophagus, biopsies consistent.  Next EGD in 3 years   ESOPHAGOGASTRODUODENOSCOPY (EGD) WITH PROPOFOL N/A 03/08/2021   Procedure: ESOPHAGOGASTRODUODENOSCOPY (EGD) WITH PROPOFOL;  Surgeon: CEloise Harman DO;  [facility];  Service: Endoscopy;  Laterality: N/A;  1:00pm   LOOP RECORDER INSERTION N/A 03/26/2022   Procedure: LOOP RECORDER INSERTION;  Surgeon: CConstance Haw MD;  [facility];  Service: Cardiovascular;  Laterality: N/A;   POLYPECTOMY  06/29/2020   Procedure: POLYPECTOMY;  Surgeon: CEloise Harman DO;  [facility];  Service: Endoscopy;;   SHOULDER SURGERY Right    x2   TRIGGER FINGER RELEASE Right 2016    Family History  Problem Relation Age of Onset   Rheum arthritis Mother    Bladder Cancer Mother    Kidney disease Father    Bladder Cancer Maternal Grandfather    Irritable bowel syndrome Paternal Grandmother    Colon cancer Neg Hx    Celiac disease Neg Hx    Inflammatory bowel disease Neg Hx     Social History   Tobacco Use   Smoking status: Former    Packs/day: 0.00    Years: 0.00    Total pack years: 0.00  Types: Cigarettes    Quit date: 11/03/2019    Years since quitting: 2.4    Passive exposure: Current   Smokeless tobacco: Never   Tobacco comments:    nicoten gum  Vaping Use   Vaping Use: Former   Quit date: 03/07/2019  Substance Use Topics   Alcohol use: No   Drug use: Yes    Types: Marijuana    Medications: I have reviewed the patient's current medications. Allergies as of 04/15/2022   No Known Allergies      Medication List        Accurate as of April 15, 2022 11:09 AM. If you have any questions, ask your nurse or doctor.          STOP taking these medications     cyclobenzaprine 10 MG tablet Commonly known as: FLEXERIL Stopped by: Virl Cagey, MD       TAKE these medications    aspirin EC 81 MG tablet Take 1 tablet (81 mg total) by mouth daily. Swallow whole.   metoprolol succinate 25 MG 24 hr tablet Commonly known as: TOPROL-XL Take 1 tablet (25 mg total) by mouth daily.   omeprazole 20 MG capsule Commonly known as: PRILOSEC Take 1 capsule (20 mg total) by mouth every morning.         ROS:  A comprehensive review of systems was negative except for: Gastrointestinal: positive for abdominal pain and reflux symptoms  Blood pressure 110/72, pulse (!) 51, temperature 98.1 F (36.7 C), temperature source Oral, resp. rate 12, height '5\' 11"'$  (1.803 m), weight 156 lb (70.8 kg), SpO2 98 %. Physical Exam Vitals reviewed.  Constitutional:      Appearance: Normal appearance.  HENT:     Head: Normocephalic.     Nose: Nose normal.  Eyes:     Extraocular Movements: Extraocular movements intact.  Cardiovascular:     Rate and Rhythm: Normal rate and regular rhythm.  Pulmonary:     Effort: Pulmonary effort is normal.     Breath sounds: Normal breath sounds.  Abdominal:     General: There is no distension.     Palpations: Abdomen is soft.     Tenderness: There is abdominal tenderness.     Comments: Right lower quadrant area <1cm size cyst mobile and superficial on abdomen   Musculoskeletal:        General: Normal range of motion.     Cervical back: Normal range of motion.  Skin:    General: Skin is warm.  Neurological:     General: No focal deficit present.     Mental Status: He is alert.  Psychiatric:        Mood and Affect: Mood normal.        Thought Content: Thought content normal.     Results: Personally reviewed- contracted gallbladder and a 1cm cyst on the right lower abdominal wall, superficial  CLINICAL DATA:  Right lower quadrant pain with nausea and diarrhea for 4 days   EXAM: CT ABDOMEN AND PELVIS WITH  CONTRAST   TECHNIQUE: Multidetector CT imaging of the abdomen and pelvis was performed using the standard protocol following bolus administration of intravenous contrast.   RADIATION DOSE REDUCTION: This exam was performed according to the departmental dose-optimization program which includes automated exposure control, adjustment of the mA and/or kV according to patient size and/or use of iterative reconstruction technique.   CONTRAST:  167m OMNIPAQUE IOHEXOL 300 MG/ML  SOLN   COMPARISON:  04/10/2021, 04/16/2020  FINDINGS: Lower chest: No acute abnormality.   Hepatobiliary: No focal liver abnormality. Gallbladder wall appears thickened, likely related to partially contracted state. No hyperdense gallstone. No pericholecystic inflammatory changes are evident by CT.   Pancreas: Unremarkable. No pancreatic ductal dilatation or surrounding inflammatory changes.   Spleen: Normal in size without focal abnormality.   Adrenals/Urinary Tract: Unremarkable adrenal glands. Kidneys enhance symmetrically. No focal renal lesion or renal stone. Mild bilateral hydroureteronephrosis. No ureteral calculi. Moderately distended urinary bladder.   Stomach/Bowel: Stomach is within normal limits. Appendix appears normal (series 2, image 51). No evidence of bowel wall thickening, distention, or inflammatory changes.   Vascular/Lymphatic: No significant vascular findings are present. No enlarged abdominal or pelvic lymph nodes.   Reproductive: Prostate is unremarkable.   Other: No free fluid. No abdominopelvic fluid collection. No pneumoperitoneum. No abdominal wall hernia.   Musculoskeletal: No acute or significant osseous findings.   IMPRESSION: 1. Mild bilateral hydroureteronephrosis without ureteral calculi. Moderately distended urinary bladder. No urinary tract calculi are seen. Correlate for urinary retention. 2. Normal appendix. 3. Gallbladder wall appears thickened, likely  related to partially contracted state. No pericholecystic inflammatory changes are evident by CT. If there is clinical concern for acute cholecystitis, further evaluation with right upper quadrant ultrasound could be performed.     Electronically Signed   By: Davina Poke D.O.   On: 04/08/2022 12:37    CLINICAL DATA: Gallbladder polyp.  EXAM:  ULTRASOUND ABDOMEN LIMITED RIGHT UPPER QUADRANT  COMPARISON: April 16, 2020.  FINDINGS:  Gallbladder:  No gallstones or wall thickening visualized. No sonographic Murphy  sign noted by sonographer. Multiple gallbladder polyps are noted,  the largest now measuring 5 mm. It is uncertain if the largest polyp  is the same polyp noted 2 years ago.  Common bile duct:  Diameter: 4 mm which is within normal limits.  Liver:  No focal lesion identified. Within normal limits in parenchymal  echogenicity. Portal vein is patent on color Doppler imaging with  normal direction of blood flow towards the liver.  Other: None.  IMPRESSION:  At least 2 gallbladder polyps are visualized, the largest measuring  5 mm. Follow-up ultrasound in 1 year may be obtained to ensure  stability.  No other abnormality seen in the right upper quadrant of the  abdomen.  Electronically Signed  By: Marijo Conception M.D.  On: 01/20/2022 14:12   Result History   Assessment & Plan:  Logan Benton is a 42 y.o. male with gallbladder polyps and contracted gallbladder and symptoms consistent with biliary colic. Discussed polyps and symptoms and reasons for cholecystectomy. Discussed excision of the cyst at the same time.   PLAN: I counseled the patient about the indication, risks and benefits of laparoscopic cholecystectomy.  He understands there is a very small chance for bleeding, infection, injury to normal structures (including common bile duct), conversion to open surgery, persistent symptoms, evolution of postcholecystectomy diarrhea, need for secondary interventions,  anesthesia reaction, cardiopulmonary issues and other risks not specifically detailed here. I described the expected recovery, the plan for follow-up and the restrictions during the recovery phase.  All questions were answered. Discussed risk of bleeding, infection and finding something other than a cyst on the resection.    All questions were answered to the satisfaction of the patient.   Virl Cagey 04/15/2022, 11:09 AM

## 2022-04-17 NOTE — Addendum Note (Signed)
Addended by: Annitta Needs on: 04/17/2022 09:15 AM   Modules accepted: Orders

## 2022-04-18 ENCOUNTER — Emergency Department (HOSPITAL_COMMUNITY)
Admission: EM | Admit: 2022-04-18 | Discharge: 2022-04-18 | Disposition: A | Discharge status: Home / Self Care | Payer: 59 | Attending: Emergency Medicine | Admitting: Emergency Medicine

## 2022-04-18 ENCOUNTER — Encounter (HOSPITAL_COMMUNITY): Payer: Self-pay | Admitting: Emergency Medicine

## 2022-04-18 ENCOUNTER — Other Ambulatory Visit: Payer: Self-pay

## 2022-04-18 ENCOUNTER — Emergency Department (HOSPITAL_COMMUNITY): Payer: 59

## 2022-04-18 DIAGNOSIS — R55 Syncope and collapse: Secondary | ICD-10-CM | POA: Insufficient documentation

## 2022-04-18 DIAGNOSIS — Z7982 Long term (current) use of aspirin: Secondary | ICD-10-CM | POA: Insufficient documentation

## 2022-04-18 DIAGNOSIS — R52 Pain, unspecified: Secondary | ICD-10-CM | POA: Diagnosis not present

## 2022-04-18 DIAGNOSIS — Z743 Need for continuous supervision: Secondary | ICD-10-CM | POA: Diagnosis not present

## 2022-04-18 DIAGNOSIS — R002 Palpitations: Secondary | ICD-10-CM | POA: Diagnosis not present

## 2022-04-18 DIAGNOSIS — R079 Chest pain, unspecified: Secondary | ICD-10-CM | POA: Insufficient documentation

## 2022-04-18 DIAGNOSIS — B839 Helminthiasis, unspecified: Secondary | ICD-10-CM | POA: Diagnosis not present

## 2022-04-18 DIAGNOSIS — R42 Dizziness and giddiness: Secondary | ICD-10-CM | POA: Diagnosis not present

## 2022-04-18 LAB — BASIC METABOLIC PANEL
Anion gap: 8 (ref 5–15)
BUN: 18 mg/dL (ref 6–20)
CO2: 23 mmol/L (ref 22–32)
Calcium: 9.6 mg/dL (ref 8.9–10.3)
Chloride: 105 mmol/L (ref 98–111)
Creatinine, Ser: 0.94 mg/dL (ref 0.61–1.24)
GFR, Estimated: >60 mL/min (ref >60)
Glucose, Bld: 108 mg/dL — ABNORMAL HIGH (ref 70–99)
Potassium: 3.8 mmol/L (ref 3.5–5.1)
Sodium: 136 mmol/L (ref 135–145)

## 2022-04-18 LAB — CBC
HCT: 47 % (ref 39.0–52.0)
Hemoglobin: 16 g/dL (ref 13.0–17.0)
MCH: 31.7 pg (ref 26.0–34.0)
MCHC: 34 g/dL (ref 30.0–36.0)
MCV: 93.3 fL (ref 80.0–100.0)
Platelets: 243 10*3/uL (ref 150–400)
RBC: 5.04 MIL/uL (ref 4.22–5.81)
RDW: 12.7 % (ref 11.5–15.5)
WBC: 6.4 10*3/uL (ref 4.0–10.5)
nRBC: 0 % (ref 0.0–0.2)

## 2022-04-18 LAB — TROPONIN I (HIGH SENSITIVITY)
Troponin I (High Sensitivity): <2 ng/L (ref <18)
Troponin I (High Sensitivity): <2 ng/L (ref <18)

## 2022-04-18 NOTE — ED Notes (Signed)
Geographical information systems officer complete, MD aware

## 2022-04-18 NOTE — Discharge Instructions (Addendum)
Please follow up with Dr Curt Bears for your heart workup.

## 2022-04-18 NOTE — ED Triage Notes (Signed)
Pt to the ED with [facility] with chest pain with dizziness.  Pt had a look recorded placed 3 weeks ago. Pt received 324 mg of Aspirin in route.

## 2022-04-18 NOTE — ED Notes (Signed)
Pts pocket knife taken to security

## 2022-04-18 NOTE — ED Provider Notes (Signed)
[facility] EMERGENCY DEPARTMENT Provider Note   CSN: [id number] Arrival date & time: 04/18/22  0802     History  Chief Complaint  Patient presents with   Chest Pain    Logan Benton is a 41 y.o. male presenting emergency department with chest pain and palpitations and near syncope.  The patient reports that this episode occurred this morning while he was at rest.  He began to have palpitations, began to feel lightheaded with tunnel vision, and developed some left-sided chest pain that is pleuritic.  This feels similar to episodes he had in the past that preceded syncope, prompting a hospitalization a month ago.  Therefore he called EMS.  EMS gave the patient 324 mg of aspirin in route to the hospital.  The patient feels much better on arrival in the hospital, reporting that the palpitations have gone away, he still feels "a little fuzzy headed" and he still has milder left-sided chest pain, but not as significant.  He reports a family history of suspected cardiac disease as cause of death in his great grandfather in his 30s.  No other significant or abnormal family history of sudden cardiac death, or death at a young age.  I reviewed his medical records.  Per these external records, he was admitted last month to the hospital with concern for chest pain and syncope episodes and had cardiac workup as noted below.  There was some potential concern for brugada syndrome based on abnormal ECG at the time.  EP was consulted.  Ultimately they opted to implant a loop recorder.  They also started the patient on metoprolol 25 mg extended release once daily, which she has been compliant with.  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 Authorizing Provider  aspirin EC 81 MG tablet Take 1 tablet (81 mg total) by mouth daily. Swallow whole. 03/27/22  Yes Little Ishikawa, MD  metoprolol succinate (TOPROL-XL) 25 MG 24 hr tablet Take 1 tablet (25 mg total) by mouth daily. 03/27/22  Yes Little Ishikawa, MD  omeprazole (PRILOSEC) 20 MG capsule Take 1 capsule (20 mg total) by mouth every morning. 03/06/22  Yes Lindell Spar, MD  albendazole (ALBENZA) 200 MG tablet Take 2 tablets (400 mg total) by mouth 2 (two) times daily for 5 days. Patient not taking: Reported on 04/18/2022 04/17/22 04/22/22  Annitta Needs, NP      Allergies    Patient has no known allergies.    Review of Systems   Review of Systems  Physical Exam Updated Vital Signs BP (!) 129/93   Pulse (!) 53   Temp 98.2 F (36.8 C) (Oral)   Resp 14   Ht '5\' 11"'$  (1.803 m)   Wt 70.8 kg   SpO2 99%   BMI 21.76 kg/m  Physical Exam Constitutional:      General: He is not in acute distress. HENT:     Head: Normocephalic and atraumatic.  Eyes:     Conjunctiva/sclera: Conjunctivae normal.     Pupils: Pupils are equal, round, and reactive to  light.  Cardiovascular:     Rate and Rhythm: Normal rate and regular rhythm.  Pulmonary:     Effort: Pulmonary effort is normal. No respiratory distress.  Abdominal:     General: There is no distension.     Tenderness: There is no abdominal tenderness.  Skin:    General: Skin is warm and dry.  Neurological:     General: No focal deficit present.     Mental Status: He is alert. Mental status is at baseline.  Psychiatric:        Mood and Affect: Mood normal.        Behavior: Behavior normal.     ED Results / Procedures / Treatments   Labs (all labs ordered are listed, but only abnormal results are displayed) Labs Reviewed  BASIC METABOLIC PANEL -  Abnormal; Notable for the following components:      Result Value   Glucose, Bld 108 (*)    All other components within normal limits  CBC  TROPONIN I (HIGH SENSITIVITY)  TROPONIN I (HIGH SENSITIVITY)    EKG EKG Interpretation  Date/Time:  Friday April 18 2022 08:06:59 EDT Ventricular Rate:  69 PR Interval:  125 QRS Duration: 121 QT Interval:  405 QTC Calculation: 434 R Axis:   85 Text Interpretation: Sinus rhythm Right bundle branch block ST elev, probable normal early repol pattern No significant change from prior tracing Confirmed by Octaviano Glow [phone]) on 04/18/2022 8:29:30 AM  Radiology DG Chest 2 View  Result Date: 04/18/2022 CLINICAL DATA:  Chest pain and tightness. EXAM: CHEST - 2 VIEW COMPARISON:  AP chest 03/23/2022 FINDINGS: A new cardiac loop recorder overlies the anterior left chest. Cardiac silhouette and mediastinal contours are within normal limits. The lungs are clear. No pleural effusion or pneumothorax. Minimal multilevel degenerative disc changes of the thoracic spine. IMPRESSION: No active cardiopulmonary disease. Electronically Signed   By: Yvonne Kendall M.D.   On: 04/18/2022 08:50    Procedures Procedures    Medications Ordered in ED Medications - No data to display  ED Course/ Medical Decision Making/ A&P Clinical Course as of 04/18/22 1631  Fri Apr 18, 2022  0953 Dr Radford Pax from cardiology recommending that we interrogate the [facility] [employer] loop recorder device, we will attempt to do so [MT]  7253 Awaiting report from [facility] [MT]  1419 Patient remains asymptomatic.  Nursing staff is again attempting to reach out to [employer] to obtain report for loop recorder.  Tele looks normal  [MT]  [facility] representative reports no active arrhythmia, although patient reported symptoms around 0700 of heart racing, but HR was 82 beats per minute, NSR. [MT]  1504 I spoke to Dr Radford Pax from cardiology -with undetectable troponins, normal  echocardiogram, normal coronary CT scan, normal stress test, and now normal loop recorder rhythm checks, this seems unlikely to be an active or life-threatening cardiac condition.  It would be reasonably safe to discharge the patient have him continue to follow-up with Dr. Curt Bears in the office.  The patient is happy and agreeable with this plan.  I reviewed his telemetry a final time, and he has not had any evidence of arrhythmias in the ED. [MT]    Clinical Course User Index [MT] Lulubelle Simcoe, Carola Rhine, MD                           Medical Decision Making Amount and/or Complexity of Data Reviewed Labs: ordered. Radiology: ordered.  This patient presents to the ED with concern for palpitations, lightheadedness, chest pain. This involves an extensive number of treatment options, and is a complaint that carries with it a high risk of complications and morbidity.  The differential diagnosis includes symptomatic arrhythmia versus SVT versus reflux versus vasospasm versus other  Additional history obtained from EMS  External records from outside source obtained and reviewed including cardiac outpatient records and work-up as noted in history above  I ordered and personally interpreted labs.  The pertinent results include:  trop undetectable, electrolytes wnl  I ordered imaging studies including the chest I independently visualized and interpreted imaging which showed no acute abnormalities I agree with the radiologist interpretation  The patient was maintained on a cardiac monitor.  I personally viewed and interpreted the cardiac monitored which showed an underlying rhythm of: sinus rhythm  Per my interpretation the patient's ECG shows sinus rhythm with no acute ischemic findings.  There is a camelback pattern noted in V2 that could be consistent with type II brugada, but is non-specific.  In the past there was concern more for type 1 brugada.  I have reviewed the patients home medicines and have  made adjustments as needed  Test Considered: Doubt acute pulmonary embolism with his clinical presentation.  Not believe CT angiogram is indicated  Within normal coronary CT scan and normal stress test performed 1 month ago, I have a very low suspicion for acute coronary syndrome.  Likewise have a low suspicion for aortic dissection at this time  I requested consultation with the cardiology,  and discussed lab and imaging findings as well as pertinent plan - they recommend: see ed course  After the interventions noted above, I reevaluated the patient and found that they have: improved  Dispostion:  After consideration of the diagnostic results and the patients response to treatment, I feel that the patent would benefit from outpatient follow up         Final Clinical Impression(s) / ED Diagnoses Final diagnoses:  Palpitations    Rx / DC Orders ED Discharge Orders     None         Malakie Balis, Carola Rhine, MD 04/18/22 [phone]

## 2022-04-21 NOTE — Patient Instructions (Signed)
Lenus L Hopke  04/21/2022     '@[email or web address]'$ @   Your procedure is scheduled on  04/25/2022.   Report to [facility] at  1000  A.M.   Call this number if you have problems the morning of surgery:  [phone]   Remember:  Do not eat or drink after midnight.      Take these medicines the morning of surgery with A SIP OF WATER                             metoprolol, prilosec.     Do not wear jewelry, make-up or nail polish.  Do not wear lotions, powders, or perfumes, or deodorant.  Do not shave 48 hours prior to surgery.  Men may shave face and neck.  Do not bring valuables to the hospital.  [facility] is not responsible for any belongings or valuables.  Contacts, dentures or bridgework may not be worn into surgery.  Leave your suitcase in the car.  After surgery it may be brought to your room.  For patients admitted to the hospital, discharge time will be determined by your treatment team.  Patients discharged the day of surgery will not be allowed to drive home and must have someone with them for 24 hours.    Special instructions:   DO NOT smoke tobacco or vape for 24 hours before your procedure.  Please read over the following fact sheets that you were given. Coughing and Deep Breathing, Surgical Site Infection Prevention, Anesthesia Post-op Instructions, and Care and Recovery After Surgery      General Anesthesia, Adult, Care After This sheet gives you information about how to care for yourself after your procedure. Your health care provider may also give you more specific instructions. If you have problems or questions, contact your health care provider. What can I expect after the procedure? After the procedure, the following side effects are common: Pain or discomfort at the IV site. Nausea. Vomiting. Sore throat. Trouble concentrating. Feeling cold or chills. Feeling weak or tired. Sleepiness and fatigue. Soreness and body aches. These  side effects can affect parts of the body that were not involved in surgery. Follow these instructions at home: For the time period you were told by your health care provider:  Rest. Do not participate in activities where you could fall or become injured. Do not drive or use machinery. Do not drink alcohol. Do not take sleeping pills or medicines that cause drowsiness. Do not make important decisions or sign legal documents. Do not take care of children on your own. Eating and drinking Follow any instructions from your health care provider about eating or drinking restrictions. When you feel hungry, start by eating small amounts of foods that are soft and easy to digest (bland), such as toast. Gradually return to your regular diet. Drink enough fluid to keep your urine pale yellow. If you vomit, rehydrate by drinking water, juice, or clear broth. General instructions If you have sleep apnea, surgery and certain medicines can increase your risk for breathing problems. Follow instructions from your health care provider about wearing your sleep device: Anytime you are sleeping, including during daytime naps. While taking prescription pain medicines, sleeping medicines, or medicines that make you drowsy. Have a responsible adult stay with you for the time you are told. It is important to have someone help care for you until you are awake  and alert. Return to your normal activities as told by your health care provider. Ask your health care provider what activities are safe for you. Take over-the-counter and prescription medicines only as told by your health care provider. If you smoke, do not smoke without supervision. Keep all follow-up visits as told by your health care provider. This is important. Contact a health care provider if: You have nausea or vomiting that does not get better with medicine. You cannot eat or drink without vomiting. You have pain that does not get better with  medicine. You are unable to pass urine. You develop a skin rash. You have a fever. You have redness around your IV site that gets worse. Get help right away if: You have difficulty breathing. You have chest pain. You have blood in your urine or stool, or you vomit blood. Summary After the procedure, it is common to have a sore throat or nausea. It is also common to feel tired. Have a responsible adult stay with you for the time you are told. It is important to have someone help care for you until you are awake and alert. When you feel hungry, start by eating small amounts of foods that are soft and easy to digest (bland), such as toast. Gradually return to your regular diet. Drink enough fluid to keep your urine pale yellow. Return to your normal activities as told by your health care provider. Ask your health care provider what activities are safe for you. This information is not intended to replace advice given to you by your health care provider. Make sure you discuss any questions you have with your health care provider. Document Revised: 06/07/2020 Document Reviewed: 01/05/2020 Elsevier Patient Education  [place]. Minimally Invasive Cholecystectomy, Care After The following information offers guidance on how to care for yourself after your procedure. Your health care provider may also give you more specific instructions. If you have problems or questions, contact your health care provider. What can I expect after the procedure? After the procedure, it is common to have: Pain at your incision sites. You will be given medicines to control this pain. Mild nausea or vomiting. Bloating and possible shoulder pain from the gas that was used during the procedure. Follow these instructions at home: Medicines Take over-the-counter and prescription medicines only as told by your health care provider. If you were prescribed an antibiotic medicine, take it as told by your health care  provider. Do not stop using the antibiotic even if you start to feel better. Ask your health care provider if the medicine prescribed to you: Requires you to avoid driving or using machinery. Can cause constipation. You may need to take these actions to prevent or treat constipation: Drink enough fluid to keep your urine pale yellow. Take over-the-counter or prescription medicines. Eat foods that are high in fiber, such as beans, whole grains, and fresh fruits and vegetables. Limit foods that are high in fat and processed sugars, such as fried or sweet foods. Incision care  Follow instructions from your health care provider about how to take care of your incisions. Make sure you: Wash your hands with soap and water for at least 20 seconds before and after you change your bandage (dressing). If soap and water are not available, use hand sanitizer. Change your dressing as told by your health care provider. Leave stitches (sutures), skin glue, or adhesive strips in place. These skin closures may need to be in place for 2 weeks or  longer. If adhesive strip edges start to loosen and curl up, you may trim the loose edges. Do not remove adhesive strips completely unless your health care provider tells you to do that. Do not take baths, swim, or use a hot tub until your health care provider approves. Ask your health care provider if you may take showers. You may only be allowed to take sponge baths. Check your incision area every day for signs of infection. Check for: More redness, swelling, or pain. Fluid or blood. Warmth. Pus or a bad smell. Activity Rest as told by your health care provider. Do not do activities that require a lot of effort. Avoid sitting for a long time without moving. Get up to take short walks every 1-2 hours. This is important to improve blood flow and breathing. Ask for help if you feel weak or unsteady. Do not lift anything that is heavier than 10 lb (4.5 kg), or the limit  that you are told, until your health care provider says that it is safe. Do not play contact sports until your health care provider approves. Do not return to work or school until your health care provider approves. Return to your normal activities as told by your health care provider. Ask your health care provider what activities are safe for you. General instructions If you were given a sedative during the procedure, it can affect you for several hours. Do not drive or operate machinery until your health care provider says that it is safe. Keep all follow-up visits. This is important. Contact a health care provider if: You develop a rash. You have more redness, swelling, or pain around your incisions. You have fluid or blood coming from your incisions. Your incisions feel warm to the touch. You have pus or a bad smell coming from your incisions. You have a fever. One or more of your incisions breaks open. Get help right away if: You have trouble breathing. You have chest pain. You have more pain in your shoulders. You faint or feel dizzy when you stand. You have severe pain in your abdomen. You have nausea or vomiting that lasts for more than one day. You have leg pain that is new or unusual, or if it is localized to one specific spot. These symptoms may represent a serious problem that is an emergency. Do not wait to see if the symptoms will go away. Get medical help right away. Call your local emergency services (911 in the [place] Do not drive yourself to the hospital. Summary After your procedure, it is common to have pain at the incision sites. You may also have nausea or bloating. Follow your health care provider's instructions about medicine, activity restrictions, and caring for your incision areas. Do not do activities that require a lot of effort. Contact a health care provider if you have a fever or other signs of infection, such as more redness, swelling, or pain around the  incisions. Get help right away if you have chest pain, increasing pain in the shoulders, or trouble breathing. This information is not intended to replace advice given to you by your health care provider. Make sure you discuss any questions you have with your health care provider. Document Revised: 03/26/2021 Document Reviewed: 03/26/2021 Elsevier Patient Education  [place]. How to Use Chlorhexidine for Bathing Chlorhexidine gluconate (CHG) is a germ-killing (antiseptic) solution that is used to clean the skin. It can get rid of the bacteria that normally live on the skin and  can keep them away for about 24 hours. To clean your skin with CHG, you may be given: A CHG solution to use in the shower or as part of a sponge bath. A prepackaged cloth that contains CHG. Cleaning your skin with CHG may help lower the risk for infection: While you are staying in the intensive care unit of the hospital. If you have a vascular access, such as a central line, to provide short-term or long-term access to your veins. If you have a catheter to drain urine from your bladder. If you are on a ventilator. A ventilator is a machine that helps you breathe by moving air in and out of your lungs. After surgery. What are the risks? Risks of using CHG include: A skin reaction. Hearing loss, if CHG gets in your ears and you have a perforated eardrum. Eye injury, if CHG gets in your eyes and is not rinsed out. The CHG product catching fire. Make sure that you avoid smoking and flames after applying CHG to your skin. Do not use CHG: If you have a chlorhexidine allergy or have previously reacted to chlorhexidine. On babies younger than 67 months of age. How to use CHG solution Use CHG only as told by your health care provider, and follow the instructions on the label. Use the full amount of CHG as directed. Usually, this is one bottle. During a shower Follow these steps when using CHG solution during a  shower (unless your health care provider gives you different instructions): Start the shower. Use your normal soap and shampoo to wash your face and hair. Turn off the shower or move out of the shower stream. Pour the CHG onto a clean washcloth. Do not use any type of brush or rough-edged sponge. Starting at your neck, lather your body down to your toes. Make sure you follow these instructions: If you will be having surgery, pay special attention to the part of your body where you will be having surgery. Scrub this area for at least 1 minute. Do not use CHG on your head or face. If the solution gets into your ears or eyes, rinse them well with water. Avoid your genital area. Avoid any areas of skin that have broken skin, cuts, or scrapes. Scrub your back and under your arms. Make sure to wash skin folds. Let the lather sit on your skin for 1-2 minutes or as long as told by your health care provider. Thoroughly rinse your entire body in the shower. Make sure that all body creases and crevices are rinsed well. Dry off with a clean towel. Do not put any substances on your body afterward--such as powder, lotion, or perfume--unless you are told to do so by your health care provider. Only use lotions that are recommended by the manufacturer. Put on clean clothes or pajamas. If it is the night before your surgery, sleep in clean sheets.  During a sponge bath Follow these steps when using CHG solution during a sponge bath (unless your health care provider gives you different instructions): Use your normal soap and shampoo to wash your face and hair. Pour the CHG onto a clean washcloth. Starting at your neck, lather your body down to your toes. Make sure you follow these instructions: If you will be having surgery, pay special attention to the part of your body where you will be having surgery. Scrub this area for at least 1 minute. Do not use CHG on your head or face. If the solution  gets into your  ears or eyes, rinse them well with water. Avoid your genital area. Avoid any areas of skin that have broken skin, cuts, or scrapes. Scrub your back and under your arms. Make sure to wash skin folds. Let the lather sit on your skin for 1-2 minutes or as long as told by your health care provider. Using a different clean, wet washcloth, thoroughly rinse your entire body. Make sure that all body creases and crevices are rinsed well. Dry off with a clean towel. Do not put any substances on your body afterward--such as powder, lotion, or perfume--unless you are told to do so by your health care provider. Only use lotions that are recommended by the manufacturer. Put on clean clothes or pajamas. If it is the night before your surgery, sleep in clean sheets. How to use CHG prepackaged cloths Only use CHG cloths as told by your health care provider, and follow the instructions on the label. Use the CHG cloth on clean, dry skin. Do not use the CHG cloth on your head or face unless your health care provider tells you to. When washing with the CHG cloth: Avoid your genital area. Avoid any areas of skin that have broken skin, cuts, or scrapes. Before surgery Follow these steps when using a CHG cloth to clean before surgery (unless your health care provider gives you different instructions): Using the CHG cloth, vigorously scrub the part of your body where you will be having surgery. Scrub using a back-and-forth motion for 3 minutes. The area on your body should be completely wet with CHG when you are done scrubbing. Do not rinse. Discard the cloth and let the area air-dry. Do not put any substances on the area afterward, such as powder, lotion, or perfume. Put on clean clothes or pajamas. If it is the night before your surgery, sleep in clean sheets.  For general bathing Follow these steps when using CHG cloths for general bathing (unless your health care provider gives you different instructions). Use a  separate CHG cloth for each area of your body. Make sure you wash between any folds of skin and between your fingers and toes. Wash your body in the following order, switching to a new cloth after each step: The front of your neck, shoulders, and chest. Both of your arms, under your arms, and your hands. Your stomach and groin area, avoiding the genitals. Your right leg and foot. Your left leg and foot. The back of your neck, your back, and your buttocks. Do not rinse. Discard the cloth and let the area air-dry. Do not put any substances on your body afterward--such as powder, lotion, or perfume--unless you are told to do so by your health care provider. Only use lotions that are recommended by the manufacturer. Put on clean clothes or pajamas. Contact a health care provider if: Your skin gets irritated after scrubbing. You have questions about using your solution or cloth. You swallow any chlorhexidine. Call your local poison control center (1-(707) 181-9359 in the [place] Get help right away if: Your eyes itch badly, or they become very red or swollen. Your skin itches badly and is red or swollen. Your hearing changes. You have trouble seeing. You have swelling or tingling in your mouth or throat. You have trouble breathing. These symptoms may represent a serious problem that is an emergency. Do not wait to see if the symptoms will go away. Get medical help right away. Call your local emergency services (911 in the  [place] Do not drive yourself to the hospital. Summary Chlorhexidine gluconate (CHG) is a germ-killing (antiseptic) solution that is used to clean the skin. Cleaning your skin with CHG may help to lower your risk for infection. You may be given CHG to use for bathing. It may be in a bottle or in a prepackaged cloth to use on your skin. Carefully follow your health care provider's instructions and the instructions on the product label. Do not use CHG if you have a chlorhexidine  allergy. Contact your health care provider if your skin gets irritated after scrubbing. This information is not intended to replace advice given to you by your health care provider. Make sure you discuss any questions you have with your health care provider. Document Revised: 12/03/2020 Document Reviewed: 12/03/2020 Elsevier Patient Education  [place].

## 2022-04-22 ENCOUNTER — Encounter (HOSPITAL_COMMUNITY): Payer: Self-pay

## 2022-04-23 ENCOUNTER — Encounter (HOSPITAL_COMMUNITY)
Admission: RE | Admit: 2022-04-23 | Discharge: 2022-04-23 | Disposition: A | Discharge status: Home / Self Care | Payer: 59 | Source: Ambulatory Visit | Attending: General Surgery | Admitting: General Surgery

## 2022-04-25 ENCOUNTER — Ambulatory Visit (HOSPITAL_BASED_OUTPATIENT_CLINIC_OR_DEPARTMENT_OTHER): Payer: 59 | Admitting: Anesthesiology

## 2022-04-25 ENCOUNTER — Encounter (HOSPITAL_COMMUNITY): Payer: Self-pay | Admitting: General Surgery

## 2022-04-25 ENCOUNTER — Other Ambulatory Visit: Payer: Self-pay

## 2022-04-25 ENCOUNTER — Ambulatory Visit (HOSPITAL_COMMUNITY)
Admission: RE | Admit: 2022-04-25 | Discharge: 2022-04-25 | Disposition: A | Discharge status: Home / Self Care | Payer: 59 | Attending: General Surgery | Admitting: General Surgery

## 2022-04-25 ENCOUNTER — Ambulatory Visit (HOSPITAL_COMMUNITY): Payer: 59 | Admitting: Anesthesiology

## 2022-04-25 ENCOUNTER — Encounter (HOSPITAL_COMMUNITY)
Admission: RE | Disposition: A | Discharge status: Home / Self Care | Payer: Self-pay | Source: Home / Self Care | Attending: General Surgery

## 2022-04-25 DIAGNOSIS — K824 Cholesterolosis of gallbladder: Secondary | ICD-10-CM | POA: Diagnosis not present

## 2022-04-25 DIAGNOSIS — K219 Gastro-esophageal reflux disease without esophagitis: Secondary | ICD-10-CM | POA: Insufficient documentation

## 2022-04-25 DIAGNOSIS — Z79899 Other long term (current) drug therapy: Secondary | ICD-10-CM | POA: Diagnosis not present

## 2022-04-25 DIAGNOSIS — I10 Essential (primary) hypertension: Secondary | ICD-10-CM | POA: Insufficient documentation

## 2022-04-25 DIAGNOSIS — R1900 Intra-abdominal and pelvic swelling, mass and lump, unspecified site: Secondary | ICD-10-CM

## 2022-04-25 DIAGNOSIS — Z87891 Personal history of nicotine dependence: Secondary | ICD-10-CM | POA: Insufficient documentation

## 2022-04-25 DIAGNOSIS — K8010 Calculus of gallbladder with chronic cholecystitis without obstruction: Secondary | ICD-10-CM | POA: Diagnosis not present

## 2022-04-25 DIAGNOSIS — D369 Benign neoplasm, unspecified site: Secondary | ICD-10-CM

## 2022-04-25 HISTORY — PX: MASS EXCISION: SHX2000

## 2022-04-25 HISTORY — PX: CHOLECYSTECTOMY: SHX55

## 2022-04-25 LAB — TOXOCARA AB (IGG), SERUM: Toxocara Ab (IgG), Serum: NEGATIVE

## 2022-04-25 SURGERY — LAPAROSCOPIC CHOLECYSTECTOMY
Anesthesia: General | Site: Abdomen | Laterality: Right

## 2022-04-25 MED ORDER — MIDAZOLAM HCL 2 MG/2ML IJ SOLN
INTRAMUSCULAR | Status: AC
  Filled 2022-04-25: qty 2

## 2022-04-25 MED ORDER — ORAL CARE MOUTH RINSE: 15.0000 mL | Freq: Once | OROMUCOSAL | Status: AC

## 2022-04-25 MED ORDER — KETOROLAC TROMETHAMINE 30 MG/ML IJ SOLN
INTRAMUSCULAR | Status: AC
  Filled 2022-04-25: qty 1

## 2022-04-25 MED ORDER — OXYCODONE HCL 5 MG PO TABS: 5.0000 mg | ORAL_TABLET | ORAL | 0 refills | Status: DC | PRN

## 2022-04-25 MED ORDER — KETOROLAC TROMETHAMINE 30 MG/ML IJ SOLN
INTRAMUSCULAR | Status: DC | PRN
  Administered 2022-04-25: 30 mg via INTRAVENOUS

## 2022-04-25 MED ORDER — OXYCODONE HCL 5 MG PO TABS
ORAL_TABLET | ORAL | Status: AC
  Filled 2022-04-25: qty 1

## 2022-04-25 MED ORDER — BUPIVACAINE HCL (PF) 0.5 % IJ SOLN
INTRAMUSCULAR | Status: AC
  Filled 2022-04-25: qty 30

## 2022-04-25 MED ORDER — DEXAMETHASONE SODIUM PHOSPHATE 10 MG/ML IJ SOLN
INTRAMUSCULAR | Status: AC
  Filled 2022-04-25: qty 1

## 2022-04-25 MED ORDER — DEXMEDETOMIDINE HCL IN NACL 80 MCG/20ML IV SOLN
INTRAVENOUS | Status: AC
  Filled 2022-04-25: qty 40

## 2022-04-25 MED ORDER — CHLORHEXIDINE GLUCONATE CLOTH 2 % EX PADS: 6.0000 | MEDICATED_PAD | Freq: Once | CUTANEOUS | Status: DC

## 2022-04-25 MED ORDER — EPHEDRINE SULFATE-NACL 50-0.9 MG/10ML-% IV SOSY
PREFILLED_SYRINGE | INTRAVENOUS | Status: DC | PRN
  Administered 2022-04-25: 5 mg via INTRAVENOUS

## 2022-04-25 MED ORDER — ONDANSETRON HCL 4 MG/2ML IJ SOLN
INTRAMUSCULAR | Status: DC | PRN
  Administered 2022-04-25: 4 mg via INTRAVENOUS

## 2022-04-25 MED ORDER — GLYCOPYRROLATE PF 0.2 MG/ML IJ SOSY
PREFILLED_SYRINGE | INTRAMUSCULAR | Status: AC
  Filled 2022-04-25: qty 2

## 2022-04-25 MED ORDER — FENTANYL CITRATE (PF) 100 MCG/2ML IJ SOLN
INTRAMUSCULAR | Status: DC | PRN
  Administered 2022-04-25: 25 ug via INTRAVENOUS
  Administered 2022-04-25: 50 ug via INTRAVENOUS
  Administered 2022-04-25: 25 ug via INTRAVENOUS
  Administered 2022-04-25: 50 ug via INTRAVENOUS
  Administered 2022-04-25 (×2): 25 ug via INTRAVENOUS
  Administered 2022-04-25: 50 ug via INTRAVENOUS

## 2022-04-25 MED ORDER — MIDAZOLAM HCL 5 MG/5ML IJ SOLN
INTRAMUSCULAR | Status: DC | PRN
  Administered 2022-04-25: 2 mg via INTRAVENOUS

## 2022-04-25 MED ORDER — BUPIVACAINE HCL (PF) 0.5 % IJ SOLN
INTRAMUSCULAR | Status: DC | PRN
  Administered 2022-04-25: 20 mL

## 2022-04-25 MED ORDER — ONDANSETRON HCL 4 MG PO TABS: 4.0000 mg | ORAL_TABLET | Freq: Three times a day (TID) | ORAL | 1 refills | Status: DC | PRN

## 2022-04-25 MED ORDER — ONDANSETRON HCL 4 MG/2ML IJ SOLN
INTRAMUSCULAR | Status: AC
  Filled 2022-04-25: qty 2

## 2022-04-25 MED ORDER — MEPERIDINE HCL 50 MG/ML IJ SOLN: 6.2500 mg | INTRAMUSCULAR | Status: DC | PRN

## 2022-04-25 MED ORDER — ROCURONIUM BROMIDE 100 MG/10ML IV SOLN
INTRAVENOUS | Status: DC | PRN
  Administered 2022-04-25: 10 mg via INTRAVENOUS
  Administered 2022-04-25: 60 mg via INTRAVENOUS

## 2022-04-25 MED ORDER — LACTATED RINGERS IV SOLN: INTRAVENOUS | Status: DC

## 2022-04-25 MED ORDER — OXYCODONE HCL 5 MG PO TABS
5.0000 mg | ORAL_TABLET | Freq: Once | ORAL | Status: AC
  Administered 2022-04-25: 5 mg via ORAL

## 2022-04-25 MED ORDER — 0.9 % SODIUM CHLORIDE (POUR BTL) OPTIME
TOPICAL | Status: DC | PRN
  Administered 2022-04-25: 1000 mL

## 2022-04-25 MED ORDER — SODIUM CHLORIDE 0.9 % IV SOLN
2.0000 g | INTRAVENOUS | Status: AC
  Administered 2022-04-25: 2 g via INTRAVENOUS

## 2022-04-25 MED ORDER — PROPOFOL 10 MG/ML IV BOLUS
INTRAVENOUS | Status: AC
  Filled 2022-04-25: qty 20

## 2022-04-25 MED ORDER — SUGAMMADEX SODIUM 200 MG/2ML IV SOLN
INTRAVENOUS | Status: DC | PRN
  Administered 2022-04-25: 150 mg via INTRAVENOUS

## 2022-04-25 MED ORDER — SODIUM CHLORIDE 0.9 % IV SOLN
INTRAVENOUS | Status: AC
  Filled 2022-04-25: qty 2

## 2022-04-25 MED ORDER — HEMOSTATIC AGENTS (NO CHARGE) OPTIME
TOPICAL | Status: DC | PRN
  Administered 2022-04-25: 1

## 2022-04-25 MED ORDER — PROPOFOL 10 MG/ML IV BOLUS
INTRAVENOUS | Status: DC | PRN
  Administered 2022-04-25: 150 mg via INTRAVENOUS

## 2022-04-25 MED ORDER — LIDOCAINE HCL (PF) 2 % IJ SOLN
INTRAMUSCULAR | Status: AC
  Filled 2022-04-25: qty 5

## 2022-04-25 MED ORDER — ONDANSETRON HCL 4 MG/2ML IJ SOLN: 4.0000 mg | Freq: Once | INTRAMUSCULAR | Status: DC | PRN

## 2022-04-25 MED ORDER — CHLORHEXIDINE GLUCONATE 0.12 % MT SOLN
OROMUCOSAL | Status: AC
  Filled 2022-04-25: qty 15

## 2022-04-25 MED ORDER — CHLORHEXIDINE GLUCONATE 0.12 % MT SOLN
15.0000 mL | Freq: Once | OROMUCOSAL | Status: AC
  Administered 2022-04-25: 15 mL via OROMUCOSAL

## 2022-04-25 MED ORDER — HYDROMORPHONE HCL 1 MG/ML IJ SOLN
0.2500 mg | INTRAMUSCULAR | Status: DC | PRN
  Administered 2022-04-25 (×3): 0.5 mg via INTRAVENOUS
  Filled 2022-04-25 (×3): qty 0.5

## 2022-04-25 MED ORDER — DEXAMETHASONE SODIUM PHOSPHATE 10 MG/ML IJ SOLN
INTRAMUSCULAR | Status: DC | PRN
  Administered 2022-04-25: 5 mg via INTRAVENOUS

## 2022-04-25 MED ORDER — FENTANYL CITRATE (PF) 250 MCG/5ML IJ SOLN
INTRAMUSCULAR | Status: AC
  Filled 2022-04-25: qty 5

## 2022-04-25 MED ORDER — LIDOCAINE 2% (20 MG/ML) 5 ML SYRINGE
INTRAMUSCULAR | Status: DC | PRN
  Administered 2022-04-25: 100 mg via INTRAVENOUS

## 2022-04-25 MED ORDER — ROCURONIUM BROMIDE 10 MG/ML (PF) SYRINGE
PREFILLED_SYRINGE | INTRAVENOUS | Status: AC
  Filled 2022-04-25: qty 10

## 2022-04-25 MED ORDER — DEXMEDETOMIDINE (PRECEDEX) IN NS 20 MCG/5ML (4 MCG/ML) IV SYRINGE
PREFILLED_SYRINGE | INTRAVENOUS | Status: DC | PRN
  Administered 2022-04-25 (×2): 4 ug via INTRAVENOUS

## 2022-04-25 MED ORDER — GLYCOPYRROLATE 0.2 MG/ML IJ SOLN
INTRAMUSCULAR | Status: DC | PRN
  Administered 2022-04-25: .2 mg via INTRAVENOUS

## 2022-04-25 SURGICAL SUPPLY — 53 items
ADH SKN CLS APL DERMABOND .7 (GAUZE/BANDAGES/DRESSINGS) ×2
APL PRP STRL LF DISP 70% ISPRP (MISCELLANEOUS) ×2
APL SWBSTK 6 STRL LF DISP (MISCELLANEOUS) ×2
APPLICATOR COTTON TIP 6 STRL (MISCELLANEOUS) IMPLANT
APPLICATOR COTTON TIP 6IN STRL (MISCELLANEOUS) ×3
APPLIER CLIP ROT 10 11.4 M/L (STAPLE) ×3
APR CLP MED LRG 11.4X10 (STAPLE) ×2
BLADE SURG 15 STRL LF DISP TIS (BLADE) ×2 IMPLANT
BLADE SURG 15 STRL SS (BLADE) ×3
CHLORAPREP W/TINT 26 (MISCELLANEOUS) ×3 IMPLANT
CLIP APPLIE ROT 10 11.4 M/L (STAPLE) ×2 IMPLANT
CLOTH BEACON ORANGE TIMEOUT ST (SAFETY) ×3 IMPLANT
COVER LIGHT HANDLE STERIS (MISCELLANEOUS) ×6 IMPLANT
DECANTER SPIKE VIAL GLASS SM (MISCELLANEOUS) ×3 IMPLANT
DERMABOND ADVANCED (GAUZE/BANDAGES/DRESSINGS) ×1
DERMABOND ADVANCED .7 DNX12 (GAUZE/BANDAGES/DRESSINGS) ×2 IMPLANT
ELECT REM PT RETURN 9FT ADLT (ELECTROSURGICAL) ×3
ELECTRODE REM PT RTRN 9FT ADLT (ELECTROSURGICAL) ×2 IMPLANT
GLOVE BIO SURGEON STRL SZ 6.5 (GLOVE) ×3 IMPLANT
GLOVE BIOGEL PI IND STRL 6.5 (GLOVE) ×2 IMPLANT
GLOVE BIOGEL PI IND STRL 7.0 (GLOVE) ×4 IMPLANT
GLOVE BIOGEL PI INDICATOR 6.5 (GLOVE) ×2
GLOVE BIOGEL PI INDICATOR 7.0 (GLOVE) ×2
GLOVE ECLIPSE 6.5 STRL STRAW (GLOVE) ×1 IMPLANT
GLOVE SS BIOGEL STRL SZ 6.5 (GLOVE) IMPLANT
GLOVE SUPERSENSE BIOGEL SZ 6.5 (GLOVE) ×1
GOWN STRL REUS W/TWL LRG LVL3 (GOWN DISPOSABLE) ×9 IMPLANT
HEMOSTAT SNOW SURGICEL 2X4 (HEMOSTASIS) ×3 IMPLANT
INST SET LAPROSCOPIC AP (KITS) ×3 IMPLANT
KIT TURNOVER KIT A (KITS) ×3 IMPLANT
MANIFOLD NEPTUNE II (INSTRUMENTS) ×3 IMPLANT
NDL HYPO 25X1 1.5 SAFETY (NEEDLE) ×2 IMPLANT
NDL INSUFFLATION 14GA 120MM (NEEDLE) ×2 IMPLANT
NEEDLE HYPO 25X1 1.5 SAFETY (NEEDLE) ×3 IMPLANT
NEEDLE INSUFFLATION 14GA 120MM (NEEDLE) ×3 IMPLANT
NS IRRIG 1000ML POUR BTL (IV SOLUTION) ×3 IMPLANT
PACK LAP CHOLE LZT030E (CUSTOM PROCEDURE TRAY) ×3 IMPLANT
PAD ARMBOARD 7.5X6 YLW CONV (MISCELLANEOUS) ×3 IMPLANT
PENCIL HANDSWITCHING (ELECTRODE) ×1 IMPLANT
SET BASIN LINEN APH (SET/KITS/TRAYS/PACK) ×3 IMPLANT
SET TUBE SMOKE EVAC HIGH FLOW (TUBING) ×3 IMPLANT
SLEEVE Z-THREAD 5X100MM (TROCAR) ×3 IMPLANT
SUT ETHILON 3 0 FSL (SUTURE) ×1 IMPLANT
SUT MNCRL AB 4-0 PS2 18 (SUTURE) ×7 IMPLANT
SUT VICRYL 0 UR6 27IN ABS (SUTURE) ×3 IMPLANT
SYR CONTROL 10ML LL (SYRINGE) ×3 IMPLANT
SYS BAG RETRIEVAL 10MM (BASKET) ×3
SYSTEM BAG RETRIEVAL 10MM (BASKET) ×2 IMPLANT
TROCAR Z-THRD FIOS HNDL 11X100 (TROCAR) ×3 IMPLANT
TROCAR Z-THREAD FIOS 5X100MM (TROCAR) ×3 IMPLANT
TROCAR Z-THREAD SLEEVE 11X100 (TROCAR) ×3 IMPLANT
TUBE CONNECTING 12X1/4 (SUCTIONS) ×3 IMPLANT
WARMER LAPAROSCOPE (MISCELLANEOUS) ×3 IMPLANT

## 2022-04-25 NOTE — Transfer of Care (Signed)
Immediate Anesthesia Transfer of Care Note  Patient: Logan Benton  Procedure(s) Performed: LAPAROSCOPIC CHOLECYSTECTOMY (Abdomen) EXCISION MASS UPPER RIGHT ABDOMEN (Right: Abdomen)  Patient Location: PACU  Anesthesia Type:General  Level of Consciousness: awake, alert  and oriented  Airway & Oxygen Therapy: Patient Spontanous Breathing  Post-op Assessment: Report given to RN and Post -op Vital signs reviewed and stable  Post vital signs: Reviewed and stable  Last Vitals:  Vitals Value Taken Time  BP 128/83 04/25/22 1109  Temp    Pulse 79 04/25/22 1112  Resp 12 04/25/22 1112  SpO2 99 % 04/25/22 1112  Vitals shown include unvalidated device data.  Last Pain:  Vitals:   04/25/22 0759  TempSrc: Oral  PainSc:       Patients Stated Pain Goal: 5 (84/57/33 [id number])  Complications: No notable events documented.

## 2022-04-25 NOTE — Anesthesia Procedure Notes (Signed)
Procedure Name: Intubation Date/Time: 04/25/2022 9:33 AM  Performed by: Gwyndolyn Saxon, CRNAPre-anesthesia Checklist: Patient identified, Emergency Drugs available, Suction available and Patient being monitored Patient Re-evaluated:Patient Re-evaluated prior to induction Oxygen Delivery Method: Circle system utilized Preoxygenation: Pre-oxygenation with 100% oxygen Induction Type: IV induction Ventilation: Mask ventilation without difficulty Laryngoscope Size: Miller and 2 Grade View: Grade I Tube type: Oral Tube size: 7.5 mm Number of attempts: 1 Airway Equipment and Method: Stylet Placement Confirmation: ETT inserted through vocal cords under direct vision, positive ETCO2 and breath sounds checked- equal and bilateral Secured at: 23 cm Tube secured with: Tape Dental Injury: Teeth and Oropharynx as per pre-operative assessment

## 2022-04-25 NOTE — Interval H&P Note (Signed)
History and Physical Interval Note:  04/25/2022 8:20 AM  Logan Benton  has presented today for surgery, with the diagnosis of GALLBLADDER POLYP DERMOID CYST RIGHT ABDOMEN.  The various methods of treatment have been discussed with the patient and family. After consideration of risks, benefits and other options for treatment, the patient has consented to  Procedure(s): LAPAROSCOPIC CHOLECYSTECTOMY (N/A) EXCISION MASS UPPER RIGHT ABDOMEN (Right) as a surgical intervention.  The patient's history has been reviewed, patient examined, no change in status, stable for surgery.  I have reviewed the patient's chart and labs.  Questions were answered to the patient's satisfaction.     Virl Cagey

## 2022-04-25 NOTE — Discharge Instructions (Signed)
Discharge Laparoscopic Surgery Instructions:  Common Complaints: Right shoulder pain is common after laparoscopic surgery. This is secondary to the gas used in the surgery being trapped under the diaphragm.  Walk to help your body absorb the gas. This will improve in a few days. Pain at the port sites are common, especially the larger port sites. This will improve with time.  Some nausea is common and poor appetite. The main goal is to stay hydrated the first few days after surgery.   Diet/ Activity: Diet as tolerated. You may not have an appetite, but it is important to stay hydrated. Drink 64 ounces of water a day. Your appetite will return with time.  Shower per your regular routine daily.  Do not take hot showers. Take warm showers that are less than 10 minutes. Rest and listen to your body, but do not remain in bed all day.  Walk everyday for at least 15-20 minutes. Deep cough and move around every 1-2 hours in the first few days after surgery.  Do not lift > 10 lbs, perform excessive bending, pushing, pulling, squatting for 1-2 weeks after surgery.  Do not pick at the dermabond glue on your incision sites.  This glue film will remain in place for 1-2 weeks and will start to peel off.  Do not place lotions or balms on your incision unless instructed to specifically by Dr. Haralambos Yeatts.   Pain Expectations and Narcotics: -After surgery you will have pain associated with your incisions and this is normal. The pain is muscular and nerve pain, and will get better with time. -You are encouraged and expected to take non narcotic medications like tylenol and ibuprofen (when able) to treat pain as multiple modalities can aid with pain treatment. -Narcotics are only used when pain is severe or there is breakthrough pain. -You are not expected to have a pain score of 0 after surgery, as we cannot prevent pain. A pain score of 3-4 that allows you to be functional, move, walk, and tolerate some activity is  the goal. The pain will continue to improve over the days after surgery and is dependent on your surgery. -Due to [place] law, we are only able to give a certain amount of pain medication to treat post operative pain, and we only give additional narcotics on a patient by patient basis.  -For most laparoscopic surgery, studies have shown that the majority of patients only need 10-15 narcotic pills, and for open surgeries most patients only need 15-20.   -Having appropriate expectations of pain and knowledge of pain management with non narcotics is important as we do not want anyone to become addicted to narcotic pain medication.  -Using ice packs in the first 48 hours and heating pads after 48 hours, wearing an abdominal binder (when recommended), and using over the counter medications are all ways to help with pain management.   -Simple acts like meditation and mindfulness practices after surgery can also help with pain control and research has proven the benefit of these practices.  Medication: Take tylenol and ibuprofen as needed for pain control, alternating every 4-6 hours.  Example:  Tylenol 1000mg @ 6am, 12noon, 6pm, 12midnight (Do not exceed 4000mg of tylenol a day). Ibuprofen 800mg @ 9am, 3pm, 9pm, 3am (Do not exceed 3600mg of ibuprofen a day).  Take Roxicodone for breakthrough pain every 4 hours.  Take Colace for constipation related to narcotic pain medication. If you do not have a bowel movement in 2 days, take Miralax   over the counter.  Drink plenty of water to also prevent constipation.   Contact Information: If you have questions or concerns, please call our office, [phone], Monday- Thursday 8AM-5PM and Friday 8AM-12Noon.  If it is after hours or on the weekend, please call [facility] Main Number, [phone], [phone], and ask to speak to the surgeon on call for Dr. Daviona Herbert at [place].   

## 2022-04-25 NOTE — Interval H&P Note (Signed)
History and Physical Interval Note:  04/25/2022 8:13 AM  Logan Benton  has presented today for surgery, with the diagnosis of GALLBLADDER POLYP DERMOID CYST RIGHT ABDOMEN.  The various methods of treatment have been discussed with the patient and family. After consideration of risks, benefits and other options for treatment, the patient has consented to  Procedure(s): LAPAROSCOPIC CHOLECYSTECTOMY (N/A) EXCISION MASS UPPER RIGHT ABDOMEN (Right) as a surgical intervention.  The patient's history has been reviewed, patient examined, no change in status, stable for surgery.  I have reviewed the patient's chart and labs.  Questions were answered to the patient's satisfaction.     Virl Cagey

## 2022-04-25 NOTE — Anesthesia Preprocedure Evaluation (Signed)
Anesthesia Evaluation  Patient identified by MRN, date of birth, ID band Patient awake    Reviewed: Allergy & Precautions, NPO status , Patient's Chart, lab work & pertinent test results, reviewed documented beta blocker date and time   Airway Mallampati: I  TM Distance: >3 FB Neck ROM: Full    Dental  (+) Dental Advisory Given, Edentulous Upper, Missing   Pulmonary neg pulmonary ROS, former smoker,    Pulmonary exam normal breath sounds clear to auscultation       Cardiovascular Exercise Tolerance: Good hypertension, Pt. on medications and Pt. on home beta blockers Normal cardiovascular exam Rhythm:Regular Rate:Normal     Neuro/Psych PSYCHIATRIC DISORDERS Anxiety Depression  Neuromuscular disease    GI/Hepatic Neg liver ROS, GERD  Medicated and Controlled,  Endo/Other  Hyperthyroidism   Renal/GU negative Renal ROS  negative genitourinary   Musculoskeletal negative musculoskeletal ROS (+)   Abdominal   Peds negative pediatric ROS (+)  Hematology negative hematology ROS (+)   Anesthesia Other Findings   Reproductive/Obstetrics negative OB ROS                            Anesthesia Physical Anesthesia Plan  ASA: 2  Anesthesia Plan: General   Post-op Pain Management: Dilaudid IV   Induction: Intravenous  PONV Risk Score and Plan: 4 or greater and Ondansetron, Dexamethasone and Midazolam  Airway Management Planned: Oral ETT  Additional Equipment:   Intra-op Plan:   Post-operative Plan: Extubation in OR  Informed Consent: I have reviewed the patients History and Physical, chart, labs and discussed the procedure including the risks, benefits and alternatives for the proposed anesthesia with the patient or authorized representative who has indicated his/her understanding and acceptance.     Dental advisory given  Plan Discussed with: CRNA and Surgeon  Anesthesia Plan Comments:          Anesthesia Quick Evaluation

## 2022-04-25 NOTE — Op Note (Addendum)
Operative Note   Preoperative Diagnosis: Gallbladder polyp, abdominal wall cyst (1cm)    Postoperative Diagnosis: Same   Procedure(s) Performed: Laparoscopic cholecystectomy; excision of 1cm cyst from abdominal wall   Surgeon: Lanell Matar. Constance Haw, MD   Assistants: No qualified resident was available   Anesthesia: General endotracheal   Anesthesiologist: Denese Killings, MD    Specimens: Gallbladder; cyst    Estimated Blood Loss: Minimal    Blood Replacement: None    Complications: None    Operative Findings: Normal gallbladder, small 1cm cyst in the subcutaneous tissue right of umbilicus and slightly lower    Procedure: The patient was taken to the operating room and placed supine. General endotracheal anesthesia was induced. Intravenous antibiotics were administered per protocol. An orogastric tube positioned to decompress the stomach. The abdomen was prepared and draped in the usual sterile fashion.    A supraumbilical incision was made and a Veress technique was utilized to achieve pneumoperitoneum to 15 mmHg with carbon dioxide. A 11 mm optiview port was placed through the supraumbilical region, and a 10 mm 0-degree operative laparoscope was introduced. The area underlying the trocar and Veress needle were inspected and without evidence of injury.  Remaining trocars were placed under direct vision. Two 5 mm ports were placed in the right abdomen, between the anterior axillary and midclavicular line.  A final 11 mm port was placed through the mid-epigastrium, near the falciform ligament.    The gallbladder fundus was elevated cephalad and the infundibulum was retracted to the patient's right. The gallbladder/cystic duct junction was skeletonized. The cystic artery noted in the triangle of Calot and was also skeletonized.  We then continued liberal medial and lateral dissection until the critical view of safety was achieved.    The cystic duct triply clipped and divided. The   cystic artery was very small and was clipped and divided.  The gallbladder was then dissected from the liver bed with electrocautery. The specimen was placed in an Endopouch and was retrieved through the epigastric site.   Final inspection revealed acceptable hemostasis. Surgical SNOW was placed in the gallbladder bed.  Trocars were removed and pneumoperitoneum was released.  0 Vicryl fascial sutures were used to close the epigastric and umbilical port sites.   The cyst on the abdominal wall was identified. An incision was made. Care was taken to excision it using sharp and blunt dissection with scissors. The cyst was removed intact. Hemostasis was achieved. All of the skin incisions were closed with 4-0 Monocryl subcuticular sutures and Dermabond. The patient was awakened from anesthesia and extubated without complication.    Curlene Labrum, MD [facility] [address] [place], [place] [address] [phone] (office)

## 2022-04-25 NOTE — Progress Notes (Signed)
[facility]  Lap cholecystectomy and cyst removed. No one to update but patient has a ride and 24 hour supervision with his roommate.  Rx sent to pharmacy. Will call on 7/31. Can return to work on Tuesday if feeling ok. If needs a note or is unable to go to work just call the office.   Curlene Labrum, MD [facility] [address] [place], [place] [address] [phone] (office)

## 2022-04-25 NOTE — Anesthesia Postprocedure Evaluation (Signed)
Anesthesia Post Note  Patient: Logan Benton  Procedure(s) Performed: LAPAROSCOPIC CHOLECYSTECTOMY (Abdomen) EXCISION MASS UPPER RIGHT ABDOMEN (Right: Abdomen)  Patient location during evaluation: Phase II Anesthesia Type: General Level of consciousness: awake and alert and oriented Pain management: pain level controlled Vital Signs Assessment: post-procedure vital signs reviewed and stable Respiratory status: spontaneous breathing, nonlabored ventilation and respiratory function stable Cardiovascular status: blood pressure returned to baseline and stable Postop Assessment: no apparent nausea or vomiting Anesthetic complications: no   No notable events documented.   Last Vitals:  Vitals:   04/25/22 1145 04/25/22 1152  BP: 116/73 130/75  Pulse: (!) 50 60  Resp: 10 12  Temp:  36.7 C  SpO2: 98% 98%    Last Pain:  Vitals:   04/25/22 1152  TempSrc: Oral  PainSc: 6                  Brockton Mckesson C Amalea Ottey

## 2022-04-28 ENCOUNTER — Encounter (HOSPITAL_COMMUNITY): Payer: Self-pay | Admitting: General Surgery

## 2022-04-28 ENCOUNTER — Ambulatory Visit (INDEPENDENT_AMBULATORY_CARE_PROVIDER_SITE_OTHER): Payer: 59

## 2022-04-28 DIAGNOSIS — R55 Syncope and collapse: Secondary | ICD-10-CM | POA: Diagnosis not present

## 2022-04-28 LAB — SURGICAL PATHOLOGY

## 2022-04-29 LAB — CUP PACEART REMOTE DEVICE CHECK
Date Time Interrogation Session: 20230724134223
Implantable Pulse Generator Implant Date: 20230621
Pulse Gen Serial Number: 177683

## 2022-05-02 NOTE — Progress Notes (Deleted)
Electrophysiology Office Note Date: 05/02/2022  ID:  Logan Benton, DOB 24-Mar-1981, MRN [id number]  PCP: Pcp, No Primary Cardiologist: Jenkins Rouge, MD Electrophysiologist: Will Meredith Leeds, MD   CC: ILR follow-up  Logan Benton is a 41 y.o. male seen today for Dr. Curt Bears . he presents today for routine electrophysiology followup.  Since last being seen in our clinic, the patient reports doing very well.  he denies chest pain, palpitations, dyspnea, PND, orthopnea, nausea, vomiting, dizziness, syncope, edema, weight gain, or early satiety.  Device History: Scientist, clinical (histocompatibility and immunogenetics) recorder implanted 03/2022 for syncope  Past Medical History:  Diagnosis Date   Abscessed tooth    C. difficile diarrhea    presumptive diagnosis   Hypertension    Trigger finger    Past Surgical History:  Procedure Laterality Date   BILATERAL CARPAL TUNNEL RELEASE     BIOPSY  06/29/2020   Procedure: BIOPSY;  Surgeon: Eloise Harman, DO;  [facility];  Service: Endoscopy;;   BIOPSY  09/03/2020   Procedure: BIOPSY;  Surgeon: Eloise Harman, DO;  [facility];  Service: Endoscopy;;   BIOPSY  03/08/2021   Procedure: BIOPSY;  Surgeon: Eloise Harman, DO;  [facility];  Service: Endoscopy;;   BIOPSY  05/10/2021   Procedure: BIOPSY;  Surgeon: Eloise Harman, DO;  [facility];  Service: Endoscopy;;   CHOLECYSTECTOMY N/A 04/25/2022   Procedure: LAPAROSCOPIC CHOLECYSTECTOMY;  Surgeon: Virl Cagey, MD;  Location: [facility];  Service: General;  Laterality: N/A;   COLONOSCOPY WITH PROPOFOL N/A 06/29/2020   Dr. Abbey Chatters: 4 mm tubular adenoma removed, nonbleeding internal hemorrhoids.  Repeat colonoscopy in 7 years.   COLONOSCOPY WITH PROPOFOL N/A 05/10/2021   Procedure: COLONOSCOPY WITH PROPOFOL;  Surgeon: Eloise Harman, DO;  [facility];  Service: Endoscopy;  Laterality: N/A;  1:15pm   ESOPHAGOGASTRODUODENOSCOPY (EGD) WITH PROPOFOL  N/A 06/29/2020   Dr. Abbey Chatters: LA grade C esophagitis, gastritis with mild chronic gastritis on path, small bowel biopsy unremarkable.   ESOPHAGOGASTRODUODENOSCOPY (EGD) WITH PROPOFOL N/A 09/03/2020   Dr. Abbey Chatters: Esophageal mucosal changes suggestive of short segment Barrett's esophagus, biopsies consistent.  Next EGD in 3 years   ESOPHAGOGASTRODUODENOSCOPY (EGD) WITH PROPOFOL N/A 03/08/2021   Procedure: ESOPHAGOGASTRODUODENOSCOPY (EGD) WITH PROPOFOL;  Surgeon: Eloise Harman, DO;  [facility];  Service: Endoscopy;  Laterality: N/A;  1:00pm   LOOP RECORDER INSERTION N/A 03/26/2022   Procedure: LOOP RECORDER INSERTION;  Surgeon: Constance Haw, MD;  Location: [facility];  Service: Cardiovascular;  Laterality: N/A;   MASS EXCISION Right 04/25/2022   Procedure: EXCISION MASS UPPER RIGHT ABDOMEN;  Surgeon: Virl Cagey, MD;  Location: [facility];  Service: General;  Laterality: Right;   POLYPECTOMY  06/29/2020   Procedure: POLYPECTOMY;  Surgeon: Eloise Harman, DO;  [facility];  Service: Endoscopy;;   SHOULDER SURGERY Right    x2   TRIGGER FINGER RELEASE Right 2016    Current Outpatient Medications  Medication Sig Dispense Refill   aspirin EC 81 MG tablet Take 1 tablet (81 mg total) by mouth daily. Swallow whole. 30 tablet 12   metoprolol succinate (TOPROL-XL) 25 MG 24 hr tablet Take 1 tablet (25 mg total) by mouth daily. 60 tablet 1   omeprazole (PRILOSEC) 20 MG capsule Take 1 capsule (20 mg total) by mouth every morning. 90 capsule 0   ondansetron (ZOFRAN) 4 MG tablet Take 1 tablet (  4 mg total) by mouth every 8 (eight) hours as needed. 30 tablet 1   oxyCODONE (ROXICODONE) 5 MG immediate release tablet Take 1 tablet (5 mg total) by mouth every 4 (four) hours as needed for severe pain or breakthrough pain. 15 tablet 0   No current facility-administered medications for this visit.    Allergies:   Patient has no known allergies.   Social History: Social  History   Socioeconomic History   Marital status: Divorced    Spouse name: Not on file   Number of children: Not on file   Years of education: Not on file   Highest education level: Not on file  Occupational History   Not on file  Tobacco Use   Smoking status: Former    Packs/day: 0.00    Years: 0.00    Total pack years: 0.00    Types: Cigarettes    Quit date: 11/03/2019    Years since quitting: 2.4    Passive exposure: Current   Smokeless tobacco: Never   Tobacco comments:    nicoten gum  Vaping Use   Vaping Use: Former   Quit date: 03/07/2019  Substance and Sexual Activity   Alcohol use: No   Drug use: Yes    Types: Marijuana   Sexual activity: Not Currently    Partners: Female  Other Topics Concern   Not on file  Social History Narrative   Not on file   Social Determinants of Health   Financial Resource Strain: Not on file  Food Insecurity: Not on file  Transportation Needs: Not on file  Physical Activity: Not on file  Stress: Not on file  Social Connections: Not on file  Intimate Partner Violence: Not on file    Family History: Family History  Problem Relation Age of Onset   Rheum arthritis Mother    Bladder Cancer Mother    Kidney disease Father    Bladder Cancer Maternal Grandfather    Irritable bowel syndrome Paternal Grandmother    Colon cancer Neg Hx    Celiac disease Neg Hx    Inflammatory bowel disease Neg Hx      Review of Systems: All other systems reviewed and are otherwise negative except as noted above.  Physical Exam: There were no vitals filed for this visit.   GEN- The patient is well appearing, alert and oriented x 3 today.   HEENT: normocephalic, atraumatic; sclera clear, conjunctiva pink; hearing intact; oropharynx clear; neck supple  Lungs- Clear to ausculation bilaterally, normal work of breathing.  No wheezes, rales, rhonchi Heart- Regular rate and rhythm, no murmurs, rubs or gallops  GI- soft, non-tender, non-distended,  bowel sounds present  Extremities- no clubbing, cyanosis, or edema  MS- no significant deformity or atrophy Skin- warm and dry, no rash or lesion; PPM pocket well healed Psych- euthymic mood, full affect Neuro- strength and sensation are intact  PPM Interrogation- reviewed in detail today,  See PACEART report  EKG:  EKG is not ordered today.  Recent Labs: 03/24/2022: Magnesium 2.2 04/08/2022: ALT 25 04/18/2022: BUN 18; Creatinine, Ser 0.94; Hemoglobin 16.0; Platelets 243; Potassium 3.8; Sodium 136   Wt Readings from Last 3 Encounters:  04/18/22 156 lb (70.8 kg)  04/15/22 156 lb (70.8 kg)  04/08/22 155 lb (70.3 kg)     Other studies Reviewed:  Echo 03/2022 LVEF 60-65%  ETT 03/26/2022   Excellent exercise capacity, achieved 17.2 METS   Peak heart rate 176 bpm (96% max age predicted HR)  No ST deviation was noted.   No evidence of ischemia  Assessment and Plan:  1. Syncope s/p [employer] recorder Normal device function See Claudia Desanctis Art report No changes today   Current medicines are reviewed at length with the patient today.   The patient {ACTIONS; HAS/DOES NOT HAVE:19233} concerns regarding his medicines.  The following changes were made today:  {NONE DEFAULTED:18576}  Labs/ tests ordered today include: *** No orders of the defined types were placed in this encounter.    Disposition:   Follow up with Dr. Curt Bears in 3 Months    Signed, Annamaria Helling  05/02/2022 3:45 PM  [place] [address] [place] [address] [phone] (office) [phone] (fax)

## 2022-05-05 ENCOUNTER — Ambulatory Visit (INDEPENDENT_AMBULATORY_CARE_PROVIDER_SITE_OTHER): Payer: 59 | Admitting: General Surgery

## 2022-05-05 DIAGNOSIS — D369 Benign neoplasm, unspecified site: Secondary | ICD-10-CM

## 2022-05-05 DIAGNOSIS — K824 Cholesterolosis of gallbladder: Secondary | ICD-10-CM

## 2022-05-05 NOTE — Progress Notes (Signed)
[facility]  I am calling the patient for post operative evaluation. This is not a billable encounter as it is under the [place] charges for the surgery.  The patient had a laparoscopic cholecystectomy and mass removed on the abdomen on 7/21. The patient reports that he is doing better. He is no longer his "gut itching" but is having some itching in his mouth.  Hopefully this will improve too. I do not think the mouth itching is related to surgery or the gallbladder.  The are tolerating a diet, having good pain control, and having regular Bms.  The incisions are healing. The patient has no concerns.   Pathology: A. GALLBLADDER, CHOLECYSTECTOMY:  Chronic cholecystitis with cholelithiasis   B. ABDOMINAL WALL, CYST, EXCISION:  Compatible with benign varix  Negative for cyst, cyst wall and foreign body type giant cell reaction   Will see the patient PRN.   Curlene Labrum, MD [facility] [address] [place], [place] [address] [phone] (office)

## 2022-05-07 ENCOUNTER — Ambulatory Visit: Payer: 59 | Admitting: Student

## 2022-05-07 DIAGNOSIS — R55 Syncope and collapse: Secondary | ICD-10-CM

## 2022-05-08 ENCOUNTER — Telehealth: Payer: Self-pay | Admitting: *Deleted

## 2022-05-08 MED ORDER — FLUCONAZOLE 200 MG PO TABS
200.0000 mg | ORAL_TABLET | Freq: Every day | ORAL | 0 refills | Status: AC
Start: 2022-05-08 — End: 2022-05-15

## 2022-05-08 NOTE — Telephone Encounter (Signed)
Dr. Constance Haw aware and agreeable to prescription.   Prescription sent to pharmacy.   Call placed to patient and patient made aware.

## 2022-05-08 NOTE — Telephone Encounter (Signed)
Received call from patient [phone]~ telephone.   Surgical Date: 04/25/2022 Procedure: Lap Chole  Allergies: NKDA Pharmacy:  [facility]  Patient reports beginning of thrush like sx, burning and itching to throat and mouth, slight white film to tongue, and increased mucus in stool.   Reports that he frequently suffers from thrush after ABTx. Reports that he was given x1 dose of ABTx prior to lap chole.   Requesting prescription for Fluconazole '200mg'$  PO QD x7 days. Declines Nystatin due to base of mouthwash usually includes high level of sucrose.

## 2022-05-12 ENCOUNTER — Ambulatory Visit: Payer: 59 | Admitting: Nurse Practitioner

## 2022-06-02 ENCOUNTER — Ambulatory Visit (INDEPENDENT_AMBULATORY_CARE_PROVIDER_SITE_OTHER): Payer: 59 | Admitting: Nurse Practitioner

## 2022-06-02 ENCOUNTER — Encounter: Payer: Self-pay | Admitting: Nurse Practitioner

## 2022-06-02 VITALS — BP 129/74 | HR 60 | Ht 64.0 in | Wt 156.6 lb

## 2022-06-02 DIAGNOSIS — B370 Candidal stomatitis: Secondary | ICD-10-CM

## 2022-06-02 DIAGNOSIS — E663 Overweight: Secondary | ICD-10-CM | POA: Diagnosis not present

## 2022-06-02 DIAGNOSIS — E0590 Thyrotoxicosis, unspecified without thyrotoxic crisis or storm: Secondary | ICD-10-CM | POA: Diagnosis not present

## 2022-06-02 NOTE — Progress Notes (Signed)
   Subjective:    Patient ID: Logan Benton, male    DOB: 1981/06/30, 41 y.o.   MRN: [id number]  HPI  41 year old male with history of hypertension, Barrett's esophagitis, thrush, irritable bowel, hypothyroidism presents to the clinic today to his care.  Patient denies any issues but states that he continues to have a burning sensation to his mouth and throat.  Patient states that he has had this sensation for years.  Patient has no other concerns.   Review of Systems  HENT:         Burning to mouth and throat  All other systems reviewed and are negative.      Objective:   Physical Exam Vitals reviewed.  Constitutional:      General: He is not in acute distress.    Appearance: Normal appearance. He is normal weight. He is not ill-appearing, toxic-appearing or diaphoretic.  HENT:     Head: Normocephalic and atraumatic.     Mouth/Throat:     Mouth: Mucous membranes are moist.     Pharynx: Oropharynx is clear. No oropharyngeal exudate or posterior oropharyngeal erythema.     Comments: No white patches or erythema noted Cardiovascular:     Rate and Rhythm: Normal rate and regular rhythm.     Pulses: Normal pulses.     Heart sounds: Normal heart sounds. No murmur heard. Pulmonary:     Effort: Pulmonary effort is normal. No respiratory distress.     Breath sounds: Normal breath sounds. No wheezing.  Musculoskeletal:     Cervical back: Normal range of motion and neck supple. No rigidity or tenderness.     Comments: Grossly intact  Lymphadenopathy:     Cervical: No cervical adenopathy.  Skin:    General: Skin is warm.     Capillary Refill: Capillary refill takes less than 2 seconds.  Neurological:     Mental Status: He is alert.     Comments: Grossly intact  Psychiatric:        Mood and Affect: Mood normal.        Behavior: Behavior normal.           Assessment & Plan:   1. Oropharyngeal candidiasis - Will r/o HIV and Diabetes - offered Nystatin, however  patient declined. States that he does not want the medication because it has sugar in it and he is concerned about his dental health - Patient states that burning is tolerable at this time and he will RTC if symptoms worsen.  - Hemoglobin A1c - HIV antibody (with reflex)  2. Overweight - Lipid Profile  RTC in 3 months for follow up   Note:  This document was prepared using Dragon voice recognition software and may include unintentional dictation errors. Note - This record has been created using [employer].  Chart creation errors have been sought, but may not always  have been located. Such creation errors do not reflect on  the standard of medical care.

## 2022-06-03 NOTE — Addendum Note (Signed)
Addended by: Claire Shown on: 06/03/2022 01:52 PM   Modules accepted: Orders

## 2022-06-06 NOTE — Progress Notes (Signed)
[place] loop recorder 

## 2022-06-10 ENCOUNTER — Ambulatory Visit (INDEPENDENT_AMBULATORY_CARE_PROVIDER_SITE_OTHER): Payer: 59

## 2022-06-10 DIAGNOSIS — R55 Syncope and collapse: Secondary | ICD-10-CM | POA: Diagnosis not present

## 2022-06-10 LAB — CUP PACEART REMOTE DEVICE CHECK
Date Time Interrogation Session: 20230905111241
Implantable Pulse Generator Implant Date: 20230621
Pulse Gen Serial Number: 177683

## 2022-06-17 ENCOUNTER — Ambulatory Visit: Payer: 59 | Admitting: Nurse Practitioner

## 2022-06-23 ENCOUNTER — Ambulatory Visit (INDEPENDENT_AMBULATORY_CARE_PROVIDER_SITE_OTHER): Payer: 59 | Admitting: Nurse Practitioner

## 2022-06-23 VITALS — Ht 64.0 in | Wt 154.2 lb

## 2022-06-23 DIAGNOSIS — R197 Diarrhea, unspecified: Secondary | ICD-10-CM

## 2022-06-23 DIAGNOSIS — B370 Candidal stomatitis: Secondary | ICD-10-CM

## 2022-06-23 DIAGNOSIS — E0590 Thyrotoxicosis, unspecified without thyrotoxic crisis or storm: Secondary | ICD-10-CM | POA: Diagnosis not present

## 2022-06-23 DIAGNOSIS — M65331 Trigger finger, right middle finger: Secondary | ICD-10-CM | POA: Diagnosis not present

## 2022-06-23 DIAGNOSIS — E663 Overweight: Secondary | ICD-10-CM | POA: Diagnosis not present

## 2022-06-23 MED ORDER — NYSTATIN 100000 UNIT/ML MT SUSP: 5.0000 mL | Freq: Four times a day (QID) | OROMUCOSAL | 0 refills | Status: DC

## 2022-06-23 NOTE — Progress Notes (Signed)
Subjective:    Patient ID: Logan Benton, male    DOB: 1981-08-03, 41 y.o.   MRN: [id number]  HPI Patient arrives requesting referral to orthopedics for "locking up" and stiffness in fingers of right hand.  Patient states that his feet beginning to affect his grip in his ability to grab.  Patient states that he has history of carpal tunnel to both hands.  Patient states that its been taking Tylenol which helps some.  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  Patient undergone multiple stool cultures but was unable to identify parasites or pinworms.  Patient has been treated with fluconazole in the past and voriconazole for possible fungal growth.  However stool cultures were not able to identify any fungal growth in stools previously.  Patient states that he also has diarrhea, general abdominal pain that comes and goes (none on exam today), fatigue, headache, brain fog, mouth burning.  Patient denies any fever, body aches, chills, nausea, vomiting, blood in stool.  Review of Systems  Constitutional:  Positive for fatigue.  Gastrointestinal:  Positive for abdominal pain and diarrhea.  Neurological:  Positive for headaches.  All other systems reviewed and are negative.      Objective:   Physical Exam Vitals reviewed.  Constitutional:      General: He is not in acute distress.    Appearance: Normal appearance. He is normal weight. He is not ill-appearing, toxic-appearing or diaphoretic.  HENT:     Head: Normocephalic and atraumatic.     Mouth/Throat:     Mouth: Mucous membranes are moist.     Pharynx: Posterior oropharyngeal erythema present.     Comments: 1 white streak noted to oropharynx Cardiovascular:     Rate and Rhythm: Normal rate and regular rhythm.     Pulses: Normal pulses.     Heart sounds:  Normal heart sounds. No murmur heard. Pulmonary:     Effort: Pulmonary effort is normal. No respiratory distress.     Breath sounds: Normal breath sounds. No wheezing.  Abdominal:     General: Abdomen is flat. Bowel sounds are normal. There is no distension.     Palpations: Abdomen is soft. There is no mass.     Tenderness: There is no abdominal tenderness. There is no guarding or rebound.     Hernia: No hernia is present.  Musculoskeletal:     Cervical back: Normal range of motion and neck supple. No rigidity or tenderness.     Comments: Grossly intact  Lymphadenopathy:     Cervical: No cervical adenopathy.  Skin:    General: Skin is warm.     Capillary Refill: Capillary refill takes less than 2 seconds.  Neurological:     Mental Status: He is alert.     Comments: Grossly intact  Psychiatric:        Mood and Affect: Mood normal.        Behavior: Behavior normal.           Assessment & Plan:   1. Oropharyngeal candidiasis -We will treat patient with nystatin - nystatin (MYCOSTATIN) 100000 UNIT/ML suspension; Take 5 mLs (500,000 Units total) by mouth 4 (four) times daily.  Dispense: 60 mL; Refill: 0 -Patient to get HIV and A1c testing done.  Orders were placed during last visit  2. Diarrhea, unspecified type -Explained  to patient that is not wise to treat with medications unless we have a diagnosis of parasites or pinworms or fungal stool infection -We will repeat stool culture and refer to infectious disease for further testing to see if parasites, pinworms or fungal infection can be identified with testing - Stool culture - Ambulatory referral to Infectious Disease  3. Trigger middle finger of right hand -Likely trigger finger -Will refer to orthopedic surgery for evaluation and possible treatments - Ambulatory referral to Orthopedic Surgery  Return to clinic as needed and/or in 3 months for follow-up   Note:  This document was prepared using Dragon voice  recognition software and may include unintentional dictation errors. Note - This record has been created using [employer].  Chart creation errors have been sought, but may not always  have been located. Such creation errors do not reflect on  the standard of medical care.

## 2022-06-24 LAB — LIPID PANEL
Chol/HDL Ratio: 1.9 ratio (ref 0.0–5.0)
Cholesterol, Total: 163 mg/dL (ref 100–199)
HDL: 85 mg/dL
LDL Chol Calc (NIH): 67 mg/dL (ref 0–99)
Triglycerides: 53 mg/dL (ref 0–149)
VLDL Cholesterol Cal: 11 mg/dL (ref 5–40)

## 2022-06-24 LAB — HIV ANTIBODY (ROUTINE TESTING W REFLEX): HIV Screen 4th Generation wRfx: NONREACTIVE

## 2022-06-24 LAB — TSH+FREE T4
Free T4: 1.31 ng/dL (ref 0.82–1.77)
TSH: 1.06 u[IU]/mL (ref 0.450–4.500)

## 2022-06-24 LAB — HEMOGLOBIN A1C
Est. average glucose Bld gHb Est-mCnc: 100 mg/dL
Hgb A1c MFr Bld: 5.1 % (ref 4.8–5.6)

## 2022-06-25 ENCOUNTER — Encounter: Payer: Self-pay | Admitting: Nurse Practitioner

## 2022-06-27 ENCOUNTER — Encounter: Payer: Self-pay | Admitting: Nurse Practitioner

## 2022-07-01 ENCOUNTER — Encounter: Payer: 59 | Admitting: Orthopedic Surgery

## 2022-07-02 ENCOUNTER — Encounter: Payer: 59 | Admitting: Orthopedic Surgery

## 2022-07-03 NOTE — Progress Notes (Signed)
[facility] Summary Report / Loop Recorder 

## 2022-07-07 ENCOUNTER — Other Ambulatory Visit: Payer: Self-pay | Admitting: Nurse Practitioner

## 2022-07-07 DIAGNOSIS — R197 Diarrhea, unspecified: Secondary | ICD-10-CM

## 2022-07-07 LAB — CUP PACEART REMOTE DEVICE CHECK
Date Time Interrogation Session: 20231002072751
Implantable Pulse Generator Implant Date: 20230621
Pulse Gen Serial Number: 177683

## 2022-07-08 ENCOUNTER — Ambulatory Visit: Payer: 59 | Admitting: Internal Medicine

## 2022-07-10 ENCOUNTER — Other Ambulatory Visit: Payer: Self-pay | Admitting: Nurse Practitioner

## 2022-07-10 ENCOUNTER — Encounter: Payer: Self-pay | Admitting: Nurse Practitioner

## 2022-07-10 DIAGNOSIS — B370 Candidal stomatitis: Secondary | ICD-10-CM

## 2022-07-10 DIAGNOSIS — R197 Diarrhea, unspecified: Secondary | ICD-10-CM

## 2022-07-11 ENCOUNTER — Ambulatory Visit (INDEPENDENT_AMBULATORY_CARE_PROVIDER_SITE_OTHER): Payer: 59

## 2022-07-11 DIAGNOSIS — R55 Syncope and collapse: Secondary | ICD-10-CM

## 2022-07-11 DIAGNOSIS — R197 Diarrhea, unspecified: Secondary | ICD-10-CM | POA: Diagnosis not present

## 2022-07-15 ENCOUNTER — Encounter: Payer: Self-pay | Admitting: Nurse Practitioner

## 2022-07-15 LAB — STOOL CULTURE: E coli, Shiga toxin Assay: NEGATIVE

## 2022-07-15 LAB — OVA AND PARASITE EXAMINATION

## 2022-07-15 NOTE — Progress Notes (Signed)
[facility] Summary Report / Loop Recorder 

## 2022-07-17 ENCOUNTER — Ambulatory Visit (INDEPENDENT_AMBULATORY_CARE_PROVIDER_SITE_OTHER): Payer: 59 | Admitting: Internal Medicine

## 2022-07-17 ENCOUNTER — Other Ambulatory Visit: Payer: Self-pay

## 2022-07-17 ENCOUNTER — Encounter: Payer: Self-pay | Admitting: Internal Medicine

## 2022-07-17 VITALS — BP 133/85 | HR 65 | Temp 97.7°F | Ht 71.0 in | Wt 155.0 lb

## 2022-07-17 DIAGNOSIS — K1379 Other lesions of oral mucosa: Secondary | ICD-10-CM | POA: Diagnosis not present

## 2022-07-17 LAB — YEAST ONLY, CULTURE

## 2022-07-17 NOTE — Progress Notes (Signed)
Patient: Logan Benton  DOB: Jan 12, 1981 MRN: [id number] PCP: Claire Shown, NP    Chief Complaint  Patient presents with   New Patient (Initial Visit)     Patient Active Problem List   Diagnosis Date Noted   Dermoid cyst 04/15/2022   Chest pain 03/23/2022   Near syncope 03/23/2022   Abdominal discomfort 01/07/2022   Irritation of oral cavity 07/24/2021   Acute pain of right shoulder 07/09/2021   Family history of renal failure 03/21/2021   Hyperthyroidism 03/21/2021   Insomnia 03/21/2021   Dyspepsia 02/27/2021   IBS (irritable bowel syndrome) 02/27/2021   Primary hypertension 02/26/2021   Oropharyngeal candidiasis 02/26/2021   Sciatica 02/26/2021   Barrett's esophagus 12/31/2020   Proctocolitis 06/13/2020   Gallbladder polyp 06/13/2020     Subjective:  Logan Benton is a 41 y.o. M with PMHx as below presents  with stool c/f parasite and "white stuff" at the back of his throat.  He states , he saw parasites in his stool. The O&P came back negative. He has take  Pyrotel peyonate OTC self treat for parasites.  Then he took doxycyline. Following that he note he has fungus everywhere. He states he had mucus in his stool following doxy that he thinks is some sort of fungus. His stools have mucous everyday and that he has  thrush. Stool are also accompanied with stringy material that float on top it. States at some point voriconazole at [facility] x 7 days and cleared up his oral thruch. Last seen in ID clnic on 07/24/21 for possible thrush(Dr. Linus Salmons), no fluconazole Rx as findings were not c/w thrush. He states he feels "bad" everyday.Denies trouble swallowing, intense burning in month. Denies fevers and chills. Work up so far has been unrevealing including stool O&P, stool Cx that are negative. Yeast stool Cx pending.  Review of Systems  All other systems reviewed and are negative.   Past Medical History:  Diagnosis Date   Abscessed tooth    C. difficile  diarrhea    presumptive diagnosis   Hypertension    Trigger finger     Outpatient Medications Prior to Visit  Medication Sig Dispense Refill   aspirin EC 81 MG tablet Take 1 tablet (81 mg total) by mouth daily. Swallow whole. 30 tablet 12   metoprolol succinate (TOPROL-XL) 25 MG 24 hr tablet Take 1 tablet (25 mg total) by mouth daily. 60 tablet 1   Multiple Vitamin (MULTIVITAMIN) tablet Take 1 tablet by mouth daily.     nystatin (MYCOSTATIN) 100000 UNIT/ML suspension Take 5 mLs (500,000 Units total) by mouth 4 (four) times daily. 60 mL 0   Probiotic Product (PROBIOTIC DAILY PO) Take by mouth.     No facility-administered medications prior to visit.     No Known Allergies  Social History   Tobacco Use   Smoking status: Former    Packs/day: 0.00    Years: 0.00    Total pack years: 0.00    Types: Cigarettes    Quit date: 11/03/2019    Years since quitting: 2.7    Passive exposure: Current   Smokeless tobacco: Never   Tobacco comments:    nicoten gum  Vaping Use   Vaping Use: Former   Quit date: 03/07/2019  Substance Use Topics   Alcohol use: No   Drug use: Yes    Types: Marijuana    Family History  Problem Relation Age of Onset   Rheum arthritis Mother  Bladder Cancer Mother    Kidney disease Father    Bladder Cancer Maternal Grandfather    Irritable bowel syndrome Paternal Grandmother    Colon cancer Neg Hx    Celiac disease Neg Hx    Inflammatory bowel disease Neg Hx     Objective:  There were no vitals filed for this visit. There is no height or weight on file to calculate BMI.  Physical Exam Constitutional:      General: He is not in acute distress.    Appearance: He is normal weight. He is not toxic-appearing.  HENT:     Head: Normocephalic and atraumatic.     Right Ear: External ear normal.     Left Ear: External ear normal.     Nose: No congestion or rhinorrhea.     Mouth/Throat:     Mouth: Mucous membranes are moist.     Pharynx: Oropharynx is  clear.  Eyes:     Extraocular Movements: Extraocular movements intact.     Conjunctiva/sclera: Conjunctivae normal.     Pupils: Pupils are equal, round, and reactive to light.  Cardiovascular:     Rate and Rhythm: Normal rate and regular rhythm.     Heart sounds: No murmur heard.    No friction rub. No gallop.  Pulmonary:     Effort: Pulmonary effort is normal.     Breath sounds: Normal breath sounds.  Abdominal:     General: Abdomen is flat. Bowel sounds are normal.     Palpations: Abdomen is soft.  Musculoskeletal:        General: No swelling. Normal range of motion.     Cervical back: Normal range of motion and neck supple.  Skin:    General: Skin is warm and dry.  Neurological:     General: No focal deficit present.     Mental Status: He is oriented to person, place, and time.  Psychiatric:        Mood and Affect: Mood normal.     Lab Results: Lab Results  Component Value Date   WBC 6.4 04/18/2022   HGB 16.0 04/18/2022   HCT 47.0 04/18/2022   MCV 93.3 04/18/2022   PLT 243 04/18/2022    Lab Results  Component Value Date   CREATININE 0.94 04/18/2022   BUN 18 04/18/2022   NA 136 04/18/2022   K 3.8 04/18/2022   CL 105 04/18/2022   CO2 23 04/18/2022    Lab Results  Component Value Date   ALT 25 04/08/2022   AST 21 04/08/2022   ALKPHOS 38 04/08/2022   BILITOT 0.7 04/08/2022     Assessment & Plan:  #Pt with c/f fungal infection in oropharynx/stool #Reports Hx of parasite in stool -I discussed Stool O&P are negative as such we have no evidence of GI parasite infection -Examined pt's throat and it was not c/w thrush, as such no anti-fungal prescribed(pt requested voriconazole) -I discussed with him that many fungi are commensal organisms in the stool as such yeast Cx may produce results of unclear clinical significance.  -Follow-up PRN  Laurice Record, MD [facility] [place]  I have personally spent 62 minutes  involved in face-to-face and non-face-to-face activities for this patient on the day of the visit. Professional time spent includes the following activities: Preparing to see the patient (review of tests), Obtaining and/or reviewing separately obtained history (admission/discharge record), Performing a medically appropriate examination and/or evaluation , Ordering medications/tests/procedures, referring and communicating with  other health care professionals, Documenting clinical information in the EMR, Independently interpreting results (not separately reported), Communicating results to the patient/family/caregiver, Counseling and educating the patient/family/caregiver and Care coordination (not separately reported).   07/17/22  1:50 PM

## 2022-07-18 ENCOUNTER — Encounter: Payer: Self-pay | Admitting: Internal Medicine

## 2022-07-18 ENCOUNTER — Telehealth: Payer: Self-pay | Admitting: Nurse Practitioner

## 2022-07-18 NOTE — Telephone Encounter (Signed)
Pt called in and states he seen the lab results for his yeast culture. Pt is requesting Voriconazole 200 mg one q 12 hours. Please advise. Thank you Pt would like a call back after meds are sent. [place]

## 2022-07-18 NOTE — Telephone Encounter (Signed)
Coral Spikes, DO  Vicente Males, LPN I have reviewed the patient's chart and results.  I have spoken with Dr. Nicki Reaper as well.    I defer to GI and/or ID. This is a chronic/ongoing issue that I feel is best addressed by GI or ID as I do not have a relationship with him.   Dr. Lacinda Axon    Pt contacted and verbalized understanding. Will also defer to [place].

## 2022-07-21 ENCOUNTER — Ambulatory Visit (INDEPENDENT_AMBULATORY_CARE_PROVIDER_SITE_OTHER): Payer: 59 | Admitting: Internal Medicine

## 2022-07-21 ENCOUNTER — Ambulatory Visit (INDEPENDENT_AMBULATORY_CARE_PROVIDER_SITE_OTHER): Payer: 59 | Admitting: Nurse Practitioner

## 2022-07-21 ENCOUNTER — Other Ambulatory Visit: Payer: Self-pay

## 2022-07-21 ENCOUNTER — Encounter: Payer: Self-pay | Admitting: Nurse Practitioner

## 2022-07-21 VITALS — BP 122/72 | HR 63 | Temp 98.2°F | Resp 20 | Ht 71.0 in | Wt 154.0 lb

## 2022-07-21 DIAGNOSIS — G479 Sleep disorder, unspecified: Secondary | ICD-10-CM

## 2022-07-21 DIAGNOSIS — R197 Diarrhea, unspecified: Secondary | ICD-10-CM

## 2022-07-21 DIAGNOSIS — R198 Other specified symptoms and signs involving the digestive system and abdomen: Secondary | ICD-10-CM | POA: Diagnosis not present

## 2022-07-21 MED ORDER — UP4 PROBIOTICS MENS PO CAPS: 1.0000 | ORAL_CAPSULE | Freq: Every day | ORAL | 3 refills | Status: DC | PRN

## 2022-07-21 NOTE — Progress Notes (Addendum)
Subjective:    Patient ID: Logan Benton, male    DOB: May 19, 1981, 41 y.o.   MRN: [id number]  HPI Patient here today for follow up on recent GI issues and to review his recent stool culture results.   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 Patient states that he has been in to see infectious disease who states that there is no need for treatment at this time.  Patient states that he continues to have brain fog, mouth burning, mucus in stools, intermittent diarrhea, and gut itch patient does state that his symptoms have gotten better.   Patient denies any blood in his stool, fever, body aches, chills, vomiting, abdominal pain, abdominal cramping, reflux, GERD.  Had a colonoscopy done last year that was negative for inflammation however tubular adenoma found on previous exam.  Recommend routine colonoscopies every 5 years.  Patient states last year he was able to get voriconazole which helped his symptoms however had a detailed discussion that we may be missing a different diagnosis that could better explain his symptoms.  Patient also admits to having trouble sleeping due to being worried about his stomach and not being able to fall asleep.  Review of Systems  Constitutional:        Brain fog  HENT:         Mouth burning  Gastrointestinal:  Positive for diarrhea.       Mucus in stool, gut itch  All other systems reviewed and are negative.      Objective:   Physical Exam Vitals reviewed.  Constitutional:      General: He is not in acute distress.    Appearance: Normal appearance. He is normal weight. He is not ill-appearing, toxic-appearing or diaphoretic.  HENT:     Head: Normocephalic and atraumatic.     Mouth/Throat:     Mouth: Mucous membranes are moist.     Pharynx: Posterior oropharyngeal erythema present.     Comments: Poor dentition.  Oropharynx mildly erythemic Cardiovascular:     Rate and Rhythm: Normal rate  and regular rhythm.     Pulses: Normal pulses.     Heart sounds: Normal heart sounds. No murmur heard. Pulmonary:     Effort: Pulmonary effort is normal. No respiratory distress.     Breath sounds: Normal breath sounds. No wheezing.  Abdominal:     General: Abdomen is flat. There is no distension.     Palpations: Abdomen is soft. There is no mass.     Tenderness: There is no abdominal tenderness. There is no right CVA tenderness, left CVA tenderness, guarding or rebound.     Hernia: No hernia is present.     Comments: No tenderness with palpation, masses, or discomfort on today's exam  Musculoskeletal:     Cervical back: Normal range of motion and neck supple. No rigidity or tenderness.     Comments: Grossly intact  Lymphadenopathy:     Cervical: No cervical adenopathy.  Skin:    General: Skin is warm.     Capillary Refill: Capillary refill takes less than 2 seconds.  Neurological:     Mental Status: He is alert.     Comments: Grossly intact  Psychiatric:        Mood and Affect: Mood normal.        Behavior: Behavior normal.           Assessment & Plan:   1. Diarrhea, unspecified type -Patient continues to  have GI symptoms. -Possible IBS symptoms.  Patient denies that he has any depression and anxiety today -Will refer to gastro for further evaluation.... If symptoms persist perhaps fecal transplant if patient is a candidate? -We will trial patient on probiotic -We will also get updated labs to rule out leukocytosis - CBC with Differential/Platelet - CMP14+EGFR - Ambulatory referral to Gastroenterology - Probiotic Product (UP4 PROBIOTICS MENS) CAPS; Take 1 capsule by mouth daily as needed.  Dispense: 30 capsule; Refill: 3 -Return to clinic if symptoms persist or do not improve   2.  Trouble sleeping -Patient to try Benadryl over-the-counter as it may also help with the itch -If Benadryl not effective may also trial Atarax as it can also help with anxiety and  itching  Return to clinic if symptoms persist   Note:  This document was prepared using Dragon voice recognition software and may include unintentional dictation errors. Note - This record has been created using [employer].  Chart creation errors have been sought, but may not always  have been located. Such creation errors do not reflect on  the standard of medical care.

## 2022-07-22 ENCOUNTER — Encounter: Payer: Self-pay | Admitting: Nurse Practitioner

## 2022-07-22 ENCOUNTER — Other Ambulatory Visit: Payer: Self-pay | Admitting: Nurse Practitioner

## 2022-07-22 DIAGNOSIS — R748 Abnormal levels of other serum enzymes: Secondary | ICD-10-CM

## 2022-07-22 DIAGNOSIS — R197 Diarrhea, unspecified: Secondary | ICD-10-CM

## 2022-07-22 LAB — CMP14+EGFR
ALT: 166 IU/L — ABNORMAL HIGH (ref 0–44)
AST: 42 IU/L — ABNORMAL HIGH (ref 0–40)
Albumin/Globulin Ratio: 2.1 (ref 1.2–2.2)
Albumin: 4.4 g/dL (ref 4.1–5.1)
Alkaline Phosphatase: 106 IU/L (ref 44–121)
BUN/Creatinine Ratio: 23 — ABNORMAL HIGH (ref 9–20)
BUN: 23 mg/dL — ABNORMAL HIGH (ref 6–20)
Bilirubin Total: 0.3 mg/dL (ref 0.0–1.2)
CO2: 26 mmol/L (ref 20–29)
Calcium: 9.8 mg/dL (ref 8.7–10.2)
Chloride: 101 mmol/L (ref 96–106)
Creatinine, Ser: 0.98 mg/dL (ref 0.76–1.27)
Globulin, Total: 2.1 g/dL (ref 1.5–4.5)
Glucose: 85 mg/dL (ref 70–99)
Potassium: 4.4 mmol/L (ref 3.5–5.2)
Sodium: 140 mmol/L (ref 134–144)
Total Protein: 6.5 g/dL (ref 6.0–8.5)
eGFR: 101 mL/min/1.73

## 2022-07-22 LAB — CBC WITH DIFFERENTIAL/PLATELET
Basophils Absolute: 0.1 10*3/uL (ref 0.0–0.2)
Basos: 1 %
EOS (ABSOLUTE): 0.2 10*3/uL (ref 0.0–0.4)
Eos: 3 %
Hematocrit: 45 % (ref 37.5–51.0)
Hemoglobin: 14.9 g/dL (ref 13.0–17.7)
Immature Grans (Abs): 0 10*3/uL (ref 0.0–0.1)
Immature Granulocytes: 0 %
Lymphocytes Absolute: 1.8 10*3/uL (ref 0.7–3.1)
Lymphs: 30 %
MCH: 31.2 pg (ref 26.6–33.0)
MCHC: 33.1 g/dL (ref 31.5–35.7)
MCV: 94 fL (ref 79–97)
Monocytes Absolute: 0.6 10*3/uL (ref 0.1–0.9)
Monocytes: 10 %
Neutrophils Absolute: 3.3 10*3/uL (ref 1.4–7.0)
Neutrophils: 56 %
Platelets: 261 10*3/uL (ref 150–450)
RBC: 4.77 x10E6/uL (ref 4.14–5.80)
RDW: 11.9 % (ref 11.6–15.4)
WBC: 5.9 10*3/uL (ref 3.4–10.8)

## 2022-07-22 NOTE — Progress Notes (Signed)
Subjective:    Patient ID: Logan Benton, male    DOB: 05/09/1981, 41 y.o.   MRN: [id number]  Chief Complaint  Patient presents with   Follow-up    Discuss stool results     Virtual Visit via Telephone/Video Note   I connected withNAME@ on 07/22/2022 at 6:08 PM by telephone and verified that I am speaking with the correct person using two identifiers.   I discussed the limitations, risks, security and privacy concerns of performing an evaluation and management service by telephone and the availability of in person appointments. I also discussed with the patient that there may be a patient responsible charge related to this service. The patient expressed understanding and agreed to proceed.  Location:  Patient: Home Provider: [facility]   HPI: Logan Benton is a 41 y.o. M with PMHx as below presents  with stool c/f parasite and "white stuff" at the back of his throat.  He states , he saw parasites in his stool. The O&P came back negative. He has take  Pyrotel peyonate OTC self treat for parasites.  Then he took doxycyline. Following that he note he has fungus everywhere. He states he had mucus in his stool following doxy that he thinks is some sort of fungus. His stools have mucous everyday and that he has  thrush. Stool are also accompanied with stringy material that float on top it. States at some point voriconazole at [facility] x 7 days and cleared up his oral thruch. Last seen in ID clnic on 07/24/21 for possible thrush(Dr. Linus Salmons), no fluconazole Rx as findings were not c/w thrush. He states he feels "bad" everyday.Denies trouble swallowing, intense burning in month. Denies fevers and chills. Work up so far has been unrevealing including stool O&P, stool Cx that are negative. Yeast stool Cx pending.   Today 07/21/22: Called due to concern for treatment as candida parapsilosis was + yeast stool Cx.  Review of Systems    No Known Allergies    Outpatient Medications  Prior to Visit  Medication Sig Dispense Refill   aspirin EC 81 MG tablet Take 1 tablet (81 mg total) by mouth daily. Swallow whole. 30 tablet 12   metoprolol succinate (TOPROL-XL) 25 MG 24 hr tablet Take 1 tablet (25 mg total) by mouth daily. 60 tablet 1   Multiple Vitamin (MULTIVITAMIN) tablet Take 1 tablet by mouth daily.     No facility-administered medications prior to visit.     Past Medical History:  Diagnosis Date   Abscessed tooth    C. difficile diarrhea    presumptive diagnosis   Hypertension    Trigger finger      Past Surgical History:  Procedure Laterality Date   BILATERAL CARPAL TUNNEL RELEASE     BIOPSY  06/29/2020   Procedure: BIOPSY;  Surgeon: Eloise Harman, DO;  [facility];  Service: Endoscopy;;   BIOPSY  09/03/2020   Procedure: BIOPSY;  Surgeon: Eloise Harman, DO;  [facility];  Service: Endoscopy;;   BIOPSY  03/08/2021   Procedure: BIOPSY;  Surgeon: Eloise Harman, DO;  [facility];  Service: Endoscopy;;   BIOPSY  05/10/2021   Procedure: BIOPSY;  Surgeon: Eloise Harman, DO;  [facility];  Service: Endoscopy;;   CHOLECYSTECTOMY N/A 04/25/2022   Procedure: LAPAROSCOPIC CHOLECYSTECTOMY;  Surgeon: Virl Cagey, MD;  Location: [facility];  Service: General;  Laterality: N/A;   COLONOSCOPY WITH PROPOFOL N/A 06/29/2020  Dr. Abbey Chatters: 4 mm tubular adenoma removed, nonbleeding internal hemorrhoids.  Repeat colonoscopy in 7 years.   COLONOSCOPY WITH PROPOFOL N/A 05/10/2021   Procedure: COLONOSCOPY WITH PROPOFOL;  Surgeon: Eloise Harman, DO;  [facility];  Service: Endoscopy;  Laterality: N/A;  1:15pm   ESOPHAGOGASTRODUODENOSCOPY (EGD) WITH PROPOFOL N/A 06/29/2020   Dr. Abbey Chatters: LA grade C esophagitis, gastritis with mild chronic gastritis on path, small bowel biopsy unremarkable.   ESOPHAGOGASTRODUODENOSCOPY (EGD) WITH PROPOFOL N/A 09/03/2020   Dr. Abbey Chatters: Esophageal mucosal changes suggestive of  short segment Barrett's esophagus, biopsies consistent.  Next EGD in 3 years   ESOPHAGOGASTRODUODENOSCOPY (EGD) WITH PROPOFOL N/A 03/08/2021   Procedure: ESOPHAGOGASTRODUODENOSCOPY (EGD) WITH PROPOFOL;  Surgeon: Eloise Harman, DO;  [facility];  Service: Endoscopy;  Laterality: N/A;  1:00pm   LOOP RECORDER INSERTION N/A 03/26/2022   Procedure: LOOP RECORDER INSERTION;  Surgeon: Constance Haw, MD;  Location: [facility];  Service: Cardiovascular;  Laterality: N/A;   MASS EXCISION Right 04/25/2022   Procedure: EXCISION MASS UPPER RIGHT ABDOMEN;  Surgeon: Virl Cagey, MD;  Location: [facility];  Service: General;  Laterality: Right;   POLYPECTOMY  06/29/2020   Procedure: POLYPECTOMY;  Surgeon: Eloise Harman, DO;  [facility];  Service: Endoscopy;;   SHOULDER SURGERY Right    x2   TRIGGER FINGER RELEASE Right 2016       Review of Systems  All other systems reviewed and are negative.     Objective:    Nursing note and vital signs reviewed.     Assessment & Plan:  #Pt with c/f fungal infection in oropharynx/stool #Reports Hx of parasite in stool -Pt c/f candida parapsilosis in yeast  stool requires treatment. Counseled pt that treatment is not indicated for candida in stool  -Pt reports he has"fungus" everywhere includng mucous in GI tract. I offered GI referral. PT declined, stating he had seen GI and was referred to ID. He did not which to see  GI again. -Follow-up PRN   The patient was advised to call back or seek an in-person evaluation if the symptoms worsen or if the condition fails to improve as anticipated.   I provided 12  minutes of non-face-to-face time during this encounter.  Follow-up: PRN

## 2022-07-23 ENCOUNTER — Encounter: Payer: Self-pay | Admitting: *Deleted

## 2022-07-24 ENCOUNTER — Encounter: Payer: Self-pay | Admitting: Nurse Practitioner

## 2022-07-24 ENCOUNTER — Other Ambulatory Visit: Payer: Self-pay | Admitting: Nurse Practitioner

## 2022-07-24 MED ORDER — METOPROLOL SUCCINATE ER 25 MG PO TB24: 25.0000 mg | ORAL_TABLET | Freq: Every day | ORAL | 1 refills | Status: DC

## 2022-08-02 ENCOUNTER — Encounter: Payer: Self-pay | Admitting: Nurse Practitioner

## 2022-08-11 ENCOUNTER — Ambulatory Visit (INDEPENDENT_AMBULATORY_CARE_PROVIDER_SITE_OTHER): Payer: Self-pay

## 2022-08-11 DIAGNOSIS — R55 Syncope and collapse: Secondary | ICD-10-CM

## 2022-08-18 ENCOUNTER — Encounter: Payer: Self-pay | Admitting: Nurse Practitioner

## 2022-08-18 DIAGNOSIS — R748 Abnormal levels of other serum enzymes: Secondary | ICD-10-CM | POA: Diagnosis not present

## 2022-08-18 LAB — CUP PACEART REMOTE DEVICE CHECK
Date Time Interrogation Session: 20231111065800
Implantable Pulse Generator Implant Date: 20230621
Pulse Gen Serial Number: 177683

## 2022-08-19 LAB — CMP14+EGFR
ALT: 78 IU/L — ABNORMAL HIGH (ref 0–44)
AST: 37 IU/L (ref 0–40)
Albumin/Globulin Ratio: 2 (ref 1.2–2.2)
Albumin: 4.5 g/dL (ref 4.1–5.1)
Alkaline Phosphatase: 64 IU/L (ref 44–121)
BUN/Creatinine Ratio: 17 (ref 9–20)
BUN: 20 mg/dL (ref 6–20)
Bilirubin Total: 0.5 mg/dL (ref 0.0–1.2)
CO2: 26 mmol/L (ref 20–29)
Calcium: 9.5 mg/dL (ref 8.7–10.2)
Chloride: 98 mmol/L (ref 96–106)
Creatinine, Ser: 1.19 mg/dL (ref 0.76–1.27)
Globulin, Total: 2.3 g/dL (ref 1.5–4.5)
Glucose: 86 mg/dL (ref 70–99)
Potassium: 4.5 mmol/L (ref 3.5–5.2)
Sodium: 137 mmol/L (ref 134–144)
Total Protein: 6.8 g/dL (ref 6.0–8.5)
eGFR: 80 mL/min/{1.73_m2} (ref >59)

## 2022-09-01 ENCOUNTER — Telehealth: Payer: Self-pay | Admitting: Orthopedic Surgery

## 2022-09-01 NOTE — Telephone Encounter (Signed)
Patient called and left voicemail at 12:29 pm about his referral back in September and he didn't want the appt at that time and now he wants to be seen    I called the patient back and left voicemail to call the office back.

## 2022-09-03 ENCOUNTER — Ambulatory Visit: Payer: 59 | Admitting: Gastroenterology

## 2022-09-17 NOTE — Progress Notes (Signed)
[place] Loop Recorder  

## 2022-09-22 ENCOUNTER — Ambulatory Visit (INDEPENDENT_AMBULATORY_CARE_PROVIDER_SITE_OTHER): Payer: 59

## 2022-09-22 DIAGNOSIS — R55 Syncope and collapse: Secondary | ICD-10-CM | POA: Diagnosis not present

## 2022-09-23 LAB — CUP PACEART REMOTE DEVICE CHECK
Date Time Interrogation Session: 20231216185400
Implantable Pulse Generator Implant Date: 20230621
Pulse Gen Serial Number: 177683

## 2022-10-23 ENCOUNTER — Ambulatory Visit: Payer: 59 | Attending: Cardiology

## 2022-10-30 NOTE — Progress Notes (Signed)
[place] Loop Recorder  

## 2022-11-05 ENCOUNTER — Ambulatory Visit (INDEPENDENT_AMBULATORY_CARE_PROVIDER_SITE_OTHER): Payer: 59 | Admitting: Internal Medicine

## 2022-11-05 ENCOUNTER — Encounter: Payer: Self-pay | Admitting: Internal Medicine

## 2022-11-05 VITALS — BP 134/79 | HR 76 | Temp 98.1°F | Ht 71.0 in | Wt 171.7 lb

## 2022-11-05 DIAGNOSIS — K625 Hemorrhage of anus and rectum: Secondary | ICD-10-CM | POA: Diagnosis not present

## 2022-11-05 DIAGNOSIS — K648 Other hemorrhoids: Secondary | ICD-10-CM

## 2022-11-05 DIAGNOSIS — K6289 Other specified diseases of anus and rectum: Secondary | ICD-10-CM

## 2022-11-05 DIAGNOSIS — K602 Anal fissure, unspecified: Secondary | ICD-10-CM | POA: Diagnosis not present

## 2022-11-05 NOTE — Progress Notes (Signed)
Referring Provider: Ameduite, Trenton Gammon, FNP Primary Care Physician:  Claire Shown, FNP (Inactive) Primary GI:  Dr. Abbey Chatters  Chief Complaint  Patient presents with   Hemorrhoids    Having some pain with internal hemorrhoids. Having rectal pain, and yellow pus discharge from rectum. Has seen some blood when wiping after BM.     HPI:   Logan Benton is a 42 y.o. male who presents to clinic today for follow-up visit.  Complaining of rectal pain, rectal itching, rectal discomfort, rectal bleeding x 2 to 3 months.  Pain with bowel movements, sometimes other times as well.  Feels like razor blades. Mild ot moderate in intensity.   Some mucus discharge.  Occasional rectal bleeding on tissue paper only.  No blood in the commode itself.  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 He requested updated procedures due to his concerns of mucus in stool and abdominal pain. Updated Colonoscopy Aug 2022 unremarkable besides internal hemorrhoids , EGD negative.   Past Medical History:  Diagnosis Date   Abscessed tooth    C. difficile diarrhea    presumptive diagnosis   Hypertension    Trigger finger     Past Surgical History:  Procedure Laterality Date   BILATERAL CARPAL TUNNEL RELEASE     BIOPSY  06/29/2020   Procedure: BIOPSY;  Surgeon: Eloise Harman, DO;  [facility];  Service: Endoscopy;;   BIOPSY  09/03/2020   Procedure: BIOPSY;  Surgeon: Eloise Harman, DO;  [facility]: [facility];  Service: Endoscopy;;   BIOPSY  03/08/2021   Procedure: BIOPSY;  Surgeon: Eloise Harman, DO;  [facility]: [facility];  Service: Endoscopy;;   BIOPSY  05/10/2021   Procedure: BIOPSY;  Surgeon: Eloise Harman, DO;  [facility]: [facility];  Service: Endoscopy;;   CHOLECYSTECTOMY N/A 04/25/2022   Procedure: LAPAROSCOPIC CHOLECYSTECTOMY;  Surgeon: Virl Cagey, MD;  Location: [facility];  Service: General;  Laterality: N/A;   COLONOSCOPY WITH  PROPOFOL N/A 06/29/2020   Dr. Abbey Chatters: 4 mm tubular adenoma removed, nonbleeding internal hemorrhoids.  Repeat colonoscopy in 7 years.   COLONOSCOPY WITH PROPOFOL N/A 05/10/2021   Procedure: COLONOSCOPY WITH PROPOFOL;  Surgeon: Eloise Harman, DO;  [facility];  Service: Endoscopy;  Laterality: N/A;  1:15pm   ESOPHAGOGASTRODUODENOSCOPY (EGD) WITH PROPOFOL N/A 06/29/2020   Dr. Abbey Chatters: LA grade C esophagitis, gastritis with mild chronic gastritis on path, small bowel biopsy unremarkable.   ESOPHAGOGASTRODUODENOSCOPY (EGD) WITH PROPOFOL N/A 09/03/2020   Dr. Abbey Chatters: Esophageal mucosal changes suggestive of short segment Barrett's esophagus, biopsies consistent.  Next EGD in 3 years   ESOPHAGOGASTRODUODENOSCOPY (EGD) WITH PROPOFOL N/A 03/08/2021   Procedure: ESOPHAGOGASTRODUODENOSCOPY (EGD) WITH PROPOFOL;  Surgeon: Eloise Harman, DO;  [facility];  Service: Endoscopy;  Laterality: N/A;  1:00pm   LOOP RECORDER INSERTION N/A 03/26/2022   Procedure: LOOP RECORDER INSERTION;  Surgeon: Constance Haw, MD;  Location: [facility];  Service: Cardiovascular;  Laterality: N/A;   MASS EXCISION Right 04/25/2022   Procedure: EXCISION MASS UPPER RIGHT ABDOMEN;  Surgeon: Virl Cagey, MD;  Location: [facility];  Service: General;  Laterality: Right;   POLYPECTOMY  06/29/2020   Procedure: POLYPECTOMY;  Surgeon: Eloise Harman, DO;  [facility];  Service: Endoscopy;;   SHOULDER SURGERY Right    x2   TRIGGER FINGER RELEASE Right 2016    Current Outpatient Medications  Medication  Sig Dispense Refill   metoprolol succinate (TOPROL-XL) 25 MG 24 hr tablet Take 1 tablet (25 mg total) by mouth daily. 60 tablet 1   Multiple Vitamin (MULTIVITAMIN) tablet Take 1 tablet by mouth daily.     Probiotic Product (UP4 PROBIOTICS MENS) CAPS Take 1 capsule by mouth daily as needed. 30 capsule 3   aspirin EC 81 MG tablet Take 1 tablet (81 mg total) by mouth daily. Swallow whole.  (Patient not taking: Reported on 11/05/2022) 30 tablet 12   No current facility-administered medications for this visit.    Allergies as of 11/05/2022   (No Known Allergies)    Family History  Problem Relation Age of Onset   Rheum arthritis Mother    Bladder Cancer Mother    Kidney disease Father    Bladder Cancer Maternal Grandfather    Irritable bowel syndrome Paternal Grandmother    Colon cancer Neg Hx    Celiac disease Neg Hx    Inflammatory bowel disease Neg Hx     Social History   Socioeconomic History   Marital status: Divorced    Spouse name: Not on file   Number of children: Not on file   Years of education: Not on file   Highest education level: Not on file  Occupational History   Not on file  Tobacco Use   Smoking status: Former    Packs/day: 0.00    Years: 0.00    Total pack years: 0.00    Types: Cigarettes    Quit date: 11/03/2019    Years since quitting: 3.0    Passive exposure: Past   Smokeless tobacco: Never   Tobacco comments:    nicoten gum  Vaping Use   Vaping Use: Former   Quit date: 03/07/2019  Substance and Sexual Activity   Alcohol use: No   Drug use: Yes    Types: Marijuana   Sexual activity: Not Currently    Partners: Female  Other Topics Concern   Not on file  Social History Narrative   Not on file   Social Determinants of Health   Financial Resource Strain: Not on file  Food Insecurity: Not on file  Transportation Needs: Not on file  Physical Activity: Not on file  Stress: Not on file  Social Connections: Not on file    Subjective: Review of Systems  Constitutional:  Negative for chills and fever.  HENT:  Negative for congestion and hearing loss.   Eyes:  Negative for blurred vision and double vision.  Respiratory:  Negative for cough and shortness of breath.   Cardiovascular:  Negative for chest pain and palpitations.  Gastrointestinal:  Negative for abdominal pain, blood in stool, constipation, diarrhea, heartburn,  melena and vomiting.       Rectal pain, rectal itching, mucus discharge, rectal bleeding   Genitourinary:  Negative for dysuria and urgency.  Musculoskeletal:  Negative for joint pain and myalgias.  Skin:  Negative for itching and rash.  Neurological:  Negative for dizziness and headaches.  Psychiatric/Behavioral:  Negative for depression. The patient is not nervous/anxious.      Objective: BP 134/79 (BP Location: Left Arm, Patient Position: Sitting, Cuff Size: Large)   Pulse 76   Temp 98.1 F (36.7 C) (Oral)   Ht '5\' 11"'$  (1.803 m)   Wt 171 lb 11.2 oz (77.9 kg)   BMI 23.95 kg/m  Physical Exam Constitutional:      Appearance: Normal appearance.  HENT:     Head: Normocephalic and atraumatic.  Eyes:     Extraocular Movements: Extraocular movements intact.     Conjunctiva/sclera: Conjunctivae normal.  Cardiovascular:     Rate and Rhythm: Normal rate and regular rhythm.  Pulmonary:     Effort: Pulmonary effort is normal.     Breath sounds: Normal breath sounds.  Abdominal:     General: Bowel sounds are normal.     Palpations: Abdomen is soft.  Musculoskeletal:        General: Normal range of motion.     Cervical back: Normal range of motion and neck supple.  Skin:    General: Skin is warm.  Neurological:     General: No focal deficit present.     Mental Status: He is alert and oriented to person, place, and time.  Psychiatric:        Mood and Affect: Mood normal.        Behavior: Behavior normal.    Rectal EXAM: 1 small anal fissure in 10 o clock position. Tender to palpation. No external hemorrhoids. No perianal inflammation.   Assessment: *Anal fissure *Internal hemorrhoids *Rectal bleeding *Rectal pain  Plan: Patient with evidence of small anal fissure on rectal exam today.  Will send in topical nitroglycerin/lidocaine to [facility].  Counseled use small amount on fingertip or Q-tip into the anal sphincter as well.  Keep bowels soft.  Follow-up in 2 to  3 months.  Rectal bleeding likely due to internal hemorrhoids.  We can discuss hemorrhoid banding once his fissure has healed appropriately.  11/05/2022 2:57 PM   Disclaimer: This note was dictated with voice recognition software. Similar sounding words can inadvertently be transcribed and may not be corrected upon review.

## 2022-11-05 NOTE — Patient Instructions (Signed)
I am going to send in topical nitroglycerin/lidocaine cream to [place] for your anal fissure.  This medication works best if you put a little bit about on your finger or a Q-tip into the anal sphincter itself.  Try to keep your bowel movements soft.  Follow-up in 2 to 3 months.  Once your anal fissure has healed, we can discuss hemorrhoid banding for your internal hemorrhoids.

## 2022-11-17 ENCOUNTER — Telehealth: Payer: Self-pay

## 2022-11-17 MED ORDER — METOPROLOL SUCCINATE ER 25 MG PO TB24: 25.0000 mg | ORAL_TABLET | Freq: Every day | ORAL | 0 refills | Status: DC

## 2022-11-17 NOTE — Telephone Encounter (Signed)
Encourage patient to contact the pharmacy for refills or they can request refills through [facility]  (Please schedule appointment if patient has not been seen in over a year)    WHAT PHARMACY WOULD THEY LIKE THIS SENT TO: [facility]   MEDICATION NAME & DOSE:metoprolol succinate (TOPROL-XL) 25 MG 24 hr tablet   NOTES/COMMENTS FROM PATIENT:Made appt for March 4 @ 1:10     [place] office please notify patient: It takes 48-72 hours to process rx refill requests Ask patient to call pharmacy to ensure rx is ready before heading there.

## 2022-11-17 NOTE — Telephone Encounter (Signed)
Refill sent to pharmacy and pt is aware. 

## 2022-11-24 ENCOUNTER — Ambulatory Visit (INDEPENDENT_AMBULATORY_CARE_PROVIDER_SITE_OTHER): Payer: 59

## 2022-11-24 DIAGNOSIS — R55 Syncope and collapse: Secondary | ICD-10-CM | POA: Diagnosis not present

## 2022-12-01 LAB — CUP PACEART REMOTE DEVICE CHECK
Date Time Interrogation Session: 20240223165600
Implantable Pulse Generator Implant Date: 20230621
Pulse Gen Serial Number: 177683

## 2022-12-04 ENCOUNTER — Encounter: Payer: Self-pay | Admitting: Radiology

## 2022-12-08 ENCOUNTER — Ambulatory Visit: Payer: 59 | Admitting: Family Medicine

## 2022-12-08 DIAGNOSIS — R55 Syncope and collapse: Secondary | ICD-10-CM

## 2022-12-08 DIAGNOSIS — I10 Essential (primary) hypertension: Secondary | ICD-10-CM

## 2022-12-08 NOTE — Progress Notes (Signed)
Subjective:  Patient ID: Logan Benton, male    DOB: 01/28/1981  Age: 42 y.o. MRN: [id number]  CC: Chief Complaint  Patient presents with   Establish Care    Growth under skin of lip for about 2 months - some pain - hx of same as a child had surgically removed    HPI:  42 year old male with the below mentioned past medical history presents to establish care with me.  He was previously seen by our nurse practitioner.  Patient has a history of IBS and Barrett's esophagus.  Followed by GI.   Patient has had a prior syncopal episode.  Has had a loop recorder placed.  He said no issues with recurrent syncope.  Continues on low-dose metoprolol.  Patient reports that he has an area of concern inside his lower lip.  He states that he has noticed it for the past 2 months.  Some occasional pain.  He would like me to examine the area today.  Patient Active Problem List   Diagnosis Date Noted   Near syncope 03/23/2022   Insomnia 03/21/2021   IBS (irritable bowel syndrome) 02/27/2021   Primary hypertension 02/26/2021   Barrett's esophagus 12/31/2020    Social Hx   Social History   Socioeconomic History   Marital status: Divorced    Spouse name: Not on file   Number of children: Not on file   Years of education: Not on file   Highest education level: Not on file  Occupational History   Not on file  Tobacco Use   Smoking status: Former    Packs/day: 0.00    Years: 0.00    Total pack years: 0.00    Types: Cigarettes    Quit date: 11/03/2019    Years since quitting: 3.0    Passive exposure: Past   Smokeless tobacco: Never   Tobacco comments:    nicoten gum  Vaping Use   Vaping Use: Former   Quit date: 03/07/2019  Substance and Sexual Activity   Alcohol use: No   Drug use: Yes    Types: Marijuana   Sexual activity: Not Currently    Partners: Female  Other Topics Concern   Not on file  Social History Narrative   Not on file   Social Determinants of Health    Financial Resource Strain: Not on file  Food Insecurity: Not on file  Transportation Needs: Not on file  Physical Activity: Not on file  Stress: Not on file  Social Connections: Not on file    Review of Systems Per HPI  Objective:  BP 136/84   Pulse (!) 59   Ht '5\' 11"'$  (1.803 m)   Wt 172 lb (78 kg)   SpO2 96%   BMI 23.99 kg/m      12/08/2022    1:12 PM 11/05/2022    2:47 PM 07/21/2022    1:24 PM  BP/Weight  Systolic BP [id number]  Diastolic BP 84 79 72  Wt. (Lbs) 172 171.7 154  BMI 23.99 kg/m2 23.95 kg/m2 21.48 kg/m2    Physical Exam Vitals and nursing note reviewed.  Constitutional:      General: He is not in acute distress.    Appearance: Normal appearance.  HENT:     Head: Normocephalic and atraumatic.     Mouth/Throat:     Comments: No appreciable oral lesions. Cardiovascular:     Rate and Rhythm: Normal rate and regular rhythm.  Pulmonary:     Effort: Pulmonary  effort is normal.     Breath sounds: Normal breath sounds. No wheezing, rhonchi or rales.  Neurological:     Mental Status: He is alert.     Lab Results  Component Value Date   WBC 5.9 07/21/2022   HGB 14.9 07/21/2022   HCT 45.0 07/21/2022   PLT 261 07/21/2022   GLUCOSE 86 08/18/2022   CHOL 163 06/23/2022   TRIG 53 06/23/2022   HDL 85 06/23/2022   LDLCALC 67 06/23/2022   ALT 78 (H) 08/18/2022   AST 37 08/18/2022   NA 137 08/18/2022   K 4.5 08/18/2022   CL 98 08/18/2022   CREATININE 1.19 08/18/2022   BUN 20 08/18/2022   CO2 26 08/18/2022   TSH 1.060 06/23/2022   INR 0.99 10/03/2010   HGBA1C 5.1 06/23/2022     Assessment & Plan:   Problem List Items Addressed This Visit       Cardiovascular and Mediastinum   Primary hypertension    Well-controlled on metoprolol.  Continue.      Near syncope    Patient to continue low-dose metoprolol.       Follow-up:  Return in about 1 year (around 12/08/2023).  [place]

## 2022-12-08 NOTE — Assessment & Plan Note (Addendum)
Well-controlled on metoprolol.  Continue.

## 2022-12-08 NOTE — Patient Instructions (Signed)
Continue your medication.  Follow up annually or sooner if needed.  Take care  Dr. Lacinda Axon

## 2022-12-08 NOTE — Assessment & Plan Note (Signed)
Patient to continue low-dose metoprolol.

## 2022-12-25 ENCOUNTER — Ambulatory Visit (INDEPENDENT_AMBULATORY_CARE_PROVIDER_SITE_OTHER): Payer: 59

## 2022-12-25 DIAGNOSIS — R55 Syncope and collapse: Secondary | ICD-10-CM | POA: Diagnosis not present

## 2022-12-29 LAB — CUP PACEART REMOTE DEVICE CHECK
Date Time Interrogation Session: 20240325001600
Implantable Pulse Generator Implant Date: 20230621
Pulse Gen Serial Number: 177683

## 2023-01-08 NOTE — Progress Notes (Signed)
[place] Loop Recorder 

## 2023-01-26 ENCOUNTER — Ambulatory Visit: Payer: 59

## 2023-01-28 NOTE — Progress Notes (Signed)
[facility] Summary Report / Loop Recorder 

## 2023-02-04 ENCOUNTER — Ambulatory Visit: Payer: 59 | Admitting: Internal Medicine

## 2023-02-07 ENCOUNTER — Other Ambulatory Visit: Payer: Self-pay | Admitting: Family Medicine

## 2023-02-11 ENCOUNTER — Ambulatory Visit (INDEPENDENT_AMBULATORY_CARE_PROVIDER_SITE_OTHER): Payer: 59

## 2023-02-11 DIAGNOSIS — R55 Syncope and collapse: Secondary | ICD-10-CM | POA: Diagnosis not present

## 2023-02-12 LAB — CUP PACEART REMOTE DEVICE CHECK
Date Time Interrogation Session: 20240508202200
Implantable Pulse Generator Implant Date: 20230621
Pulse Gen Serial Number: 177683

## 2023-02-19 ENCOUNTER — Emergency Department (HOSPITAL_COMMUNITY)
Admission: EM | Admit: 2023-02-19 | Discharge: 2023-02-19 | Disposition: A | Discharge status: Home / Self Care | Payer: 59 | Attending: Emergency Medicine | Admitting: Emergency Medicine

## 2023-02-19 ENCOUNTER — Emergency Department (HOSPITAL_COMMUNITY): Payer: 59

## 2023-02-19 ENCOUNTER — Other Ambulatory Visit: Payer: Self-pay

## 2023-02-19 ENCOUNTER — Telehealth: Payer: Self-pay | Admitting: Family Medicine

## 2023-02-19 DIAGNOSIS — I10 Essential (primary) hypertension: Secondary | ICD-10-CM | POA: Insufficient documentation

## 2023-02-19 DIAGNOSIS — R519 Headache, unspecified: Secondary | ICD-10-CM | POA: Diagnosis not present

## 2023-02-19 DIAGNOSIS — Z79899 Other long term (current) drug therapy: Secondary | ICD-10-CM | POA: Insufficient documentation

## 2023-02-19 LAB — COMPREHENSIVE METABOLIC PANEL
ALT: 72 U/L — ABNORMAL HIGH (ref 0–44)
AST: 34 U/L (ref 15–41)
Albumin: 4.1 g/dL (ref 3.5–5.0)
Alkaline Phosphatase: 83 U/L (ref 38–126)
Anion gap: 9 (ref 5–15)
BUN: 36 mg/dL — ABNORMAL HIGH (ref 6–20)
CO2: 24 mmol/L (ref 22–32)
Calcium: 9.7 mg/dL (ref 8.9–10.3)
Chloride: 102 mmol/L (ref 98–111)
Creatinine, Ser: 1 mg/dL (ref 0.61–1.24)
GFR, Estimated: >60 mL/min (ref >60)
Glucose, Bld: 106 mg/dL — ABNORMAL HIGH (ref 70–99)
Potassium: 3.9 mmol/L (ref 3.5–5.1)
Sodium: 135 mmol/L (ref 135–145)
Total Bilirubin: 0.7 mg/dL (ref 0.3–1.2)
Total Protein: 7.2 g/dL (ref 6.5–8.1)

## 2023-02-19 LAB — CBC WITH DIFFERENTIAL/PLATELET
Abs Immature Granulocytes: 0.02 10*3/uL (ref 0.00–0.07)
Basophils Absolute: 0 10*3/uL (ref 0.0–0.1)
Basophils Relative: 1 %
Eosinophils Absolute: 0.2 10*3/uL (ref 0.0–0.5)
Eosinophils Relative: 3 %
HCT: 47.8 % (ref 39.0–52.0)
Hemoglobin: 16.2 g/dL (ref 13.0–17.0)
Immature Granulocytes: 0 %
Lymphocytes Relative: 32 %
Lymphs Abs: 2.7 10*3/uL (ref 0.7–4.0)
MCH: 31.4 pg (ref 26.0–34.0)
MCHC: 33.9 g/dL (ref 30.0–36.0)
MCV: 92.6 fL (ref 80.0–100.0)
Monocytes Absolute: 1 10*3/uL (ref 0.1–1.0)
Monocytes Relative: 11 %
Neutro Abs: 4.4 10*3/uL (ref 1.7–7.7)
Neutrophils Relative %: 53 %
Platelets: 277 10*3/uL (ref 150–400)
RBC: 5.16 MIL/uL (ref 4.22–5.81)
RDW: 12.5 % (ref 11.5–15.5)
WBC: 8.3 10*3/uL (ref 4.0–10.5)
nRBC: 0 % (ref 0.0–0.2)

## 2023-02-19 LAB — CSF CELL COUNT WITH DIFFERENTIAL
RBC Count, CSF: 0 /mm3
RBC Count, CSF: 1 /mm3 — ABNORMAL HIGH
Tube #: 1
Tube #: 4
WBC, CSF: 1 /mm3 (ref 0–5)
WBC, CSF: 2 /mm3 (ref 0–5)

## 2023-02-19 LAB — PROTEIN AND GLUCOSE, CSF
Glucose, CSF: 60 mg/dL (ref 40–70)
Total  Protein, CSF: 20 mg/dL (ref 15–45)

## 2023-02-19 MED ORDER — LIDOCAINE-EPINEPHRINE (PF) 2 %-1:200000 IJ SOLN
INTRAMUSCULAR | Status: AC
  Administered 2023-02-19: 20 mL via INTRADERMAL
  Filled 2023-02-19: qty 20

## 2023-02-19 MED ORDER — MORPHINE SULFATE (PF) 2 MG/ML IV SOLN
2.0000 mg | Freq: Once | INTRAVENOUS | Status: AC
  Administered 2023-02-19: 2 mg via INTRAVENOUS
  Filled 2023-02-19: qty 1

## 2023-02-19 MED ORDER — NAPROXEN 500 MG PO TABS: 500.0000 mg | ORAL_TABLET | Freq: Two times a day (BID) | ORAL | 0 refills | Status: DC

## 2023-02-19 MED ORDER — IOHEXOL 350 MG/ML SOLN
75.0000 mL | Freq: Once | INTRAVENOUS | Status: AC | PRN
  Administered 2023-02-19: 75 mL via INTRAVENOUS

## 2023-02-19 MED ORDER — BUTALBITAL-APAP-CAFFEINE 50-325-40 MG PO TABS: 1.0000 | ORAL_TABLET | Freq: Four times a day (QID) | ORAL | 0 refills | Status: DC | PRN

## 2023-02-19 MED ORDER — LIDOCAINE-EPINEPHRINE (PF) 2 %-1:200000 IJ SOLN: 20.0000 mL | Freq: Once | INTRAMUSCULAR | Status: AC

## 2023-02-19 MED ORDER — CYCLOBENZAPRINE HCL 10 MG PO TABS: 10.0000 mg | ORAL_TABLET | Freq: Two times a day (BID) | ORAL | 0 refills | Status: DC | PRN

## 2023-02-19 NOTE — Telephone Encounter (Signed)
Nurses-headache after weightlifting that is going on like this is considered a potential red flag of potential more serious underlying issue given that we have no availability today to offer I recommend he go to the emergency department with this thank you-Dr. Lilyan Punt

## 2023-02-19 NOTE — ED Provider Notes (Cosign Needed Addendum)
[place] [facility]PARTMENT AT [facility] Provider Note   CSN: [id number] Arrival date & time: 02/19/23  1757     History  Chief Complaint  Patient presents with   Headache    Logan Benton is a 42 y.o. male who presents today with a headache localized to the right side of his head. Patient reports that he was weightlifting  several days ago when this occurred. The pain is pulsatile and worsens with exertion. Tylenol provides some relief. He denies any changes in vision or speech, N/V, fever, or recent illnesses.      Home Medications Prior to Admission medications   Medication Sig Start Date End Date Taking? Authorizing Provider  metoprolol succinate (TOPROL-XL) 25 MG 24 hr tablet Take 1 tablet by mouth once daily 02/09/23  Yes Cook, Jayce G, DO  Multiple Vitamin (MULTIVITAMIN) tablet Take 1 tablet by mouth daily.   Yes [provider]  NON FORMULARY Oil of oregano, black seed oil,   Yes [provider]  Probiotic Product (UP4 PROBIOTICS MENS) CAPS Take 1 capsule by mouth daily as needed. 07/21/22  Yes Ameduite, Alvino Chapel, FNP  vitamin B-12 (CYANOCOBALAMIN) 500 MCG tablet Take 500 mcg by mouth daily.   Yes [provider]  zinc gluconate 50 MG tablet Take 50 mg by mouth daily.   Yes [provider]      Allergies    Patient has no known allergies.    Review of Systems   Review of Systems  Neurological:  Positive for headaches.  All other systems reviewed and are negative.   Physical Exam Updated Vital Signs BP 118/81 (BP Location: Right Arm)   Pulse 60   Temp 97.7 F (36.5 C) (Oral)   Resp 16   SpO2 96%  Physical Exam Vitals and nursing note reviewed.  Constitutional:      Appearance: Normal appearance. He is well-developed.  HENT:     Head: Normocephalic and atraumatic.     Mouth/Throat:     Mouth: Mucous membranes are moist.  Eyes:     Conjunctiva/sclera: Conjunctivae normal.     Pupils: Pupils are equal,  round, and reactive to light.  Cardiovascular:     Rate and Rhythm: Normal rate and regular rhythm.     Pulses: Normal pulses.     Heart sounds: Normal heart sounds.  Pulmonary:     Effort: Pulmonary effort is normal.     Breath sounds: Normal breath sounds.  Abdominal:     Palpations: Abdomen is soft.     Tenderness: There is no abdominal tenderness.  Musculoskeletal:     Comments: Full neck ROM  Skin:    General: Skin is warm and dry.     Findings: No rash.  Neurological:     General: No focal deficit present.     Mental Status: He is alert.  Psychiatric:        Mood and Affect: Mood normal.        Behavior: Behavior normal.     ED Results / Procedures / Treatments   Labs (all labs ordered are listed, but only abnormal results are displayed) Labs Reviewed  COMPREHENSIVE METABOLIC PANEL - Abnormal; Notable for the following components:      Result Value   Glucose, Bld 106 (*)    BUN 36 (*)    ALT 72 (*)    All other components within normal limits  BODY FLUID CULTURE W GRAM STAIN  CBC WITH DIFFERENTIAL/PLATELET  CSF CELL COUNT WITH DIFFERENTIAL  PROTEIN AND GLUCOSE, CSF  CSF CELL COUNT WITH DIFFERENTIAL    EKG None  Radiology CT Angio Head W or Wo Contrast  Result Date: 02/19/2023 CLINICAL DATA:  Headache, sudden and severe. Severe headache since Monday after lifting heavy weights. EXAM: CT ANGIOGRAPHY HEAD TECHNIQUE: Multidetector CT imaging of the head was performed using the standard protocol during bolus administration of intravenous contrast. Multiplanar CT image reconstructions and MIPs were obtained to evaluate the vascular anatomy. RADIATION DOSE REDUCTION: This exam was performed according to the departmental dose-optimization program which includes automated exposure control, adjustment of the mA and/or kV according to patient size and/or use of iterative reconstruction technique. CONTRAST:  75mL OMNIPAQUE IOHEXOL 350 MG/ML SOLN COMPARISON:  None Available.  FINDINGS: CT HEAD Brain: No evidence of acute infarction, hemorrhage, hydrocephalus, extra-axial collection or mass lesion/mass effect. Vascular: See below Skull: Normal. Negative for fracture or focal lesion. Sinuses: No acute or significant finding. CTA HEAD Anterior circulation: No significant stenosis, proximal occlusion, aneurysm, or vascular malformation. Posterior circulation: No significant stenosis, proximal occlusion, aneurysm, or vascular malformation. Venous sinuses: Diffusely patent Anatomic variants: None significant Review of the MIP images confirms the above findings. IMPRESSION: Normal head CTA. Electronically Signed   By: Tiburcio Pea M.D.   On: 02/19/2023 21:22    Procedures Procedures: LP performed by Dr. Hyacinth Meeker   Medications Ordered in ED Medications  iohexol (OMNIPAQUE) 350 MG/ML injection 75 mL (75 mLs Intravenous Contrast Given 02/19/23 2052)  morphine (PF) 2 MG/ML injection 2 mg (2 mg Intravenous Given 02/19/23 2137)  lidocaine-EPINEPHrine (XYLOCAINE W/EPI) 2 %-1:200000 (PF) injection 20 mL (20 mLs Intradermal Given 02/19/23 2205)    ED Course/ Medical Decision Making/ A&P                             Medical Decision Making Amount and/or Complexity of Data Reviewed Labs: ordered. Radiology: ordered.  Risk Prescription drug management.   This patient presents to the ED for concern of , this involves an extensive number of treatment options, and is a complaint that carries with it a high risk of complications and morbidity.  The differential diagnosis includes subarachnoid hemorrhage, meningitis, atypical migraine, tension headache.   Co morbidities that complicate the patient evaluation  Primary hypertension   Additional history obtained:  Additional history obtained from patient.   Lab Tests:  I Ordered, and personally interpreted labs - elevated BUN otherwise within normal limits.  Imaging Studies ordered:  I ordered imaging studies including CT  angiogram head. I independently visualized and interpreted imaging which showed - normal head CTA, no acute findings. I agree with the radiologist interpretation Lumbar puncture performed, results pending at time of shift change.    Problem List / ED Course / Critical interventions / Medication management  Severe headache x4 days, brought on by weightlifting I ordered medication including Morphine for pain.  Reevaluation of the patient after these medicines showed that the patient improved I have reviewed the patients home medicines and have made adjustments as needed   Social Determinants of Health:  Physical activity, former smoker.   Test / Admission - Considered:  Patient had a normal CTA head without any acute findings. Lumbar puncture results pending at time of shift change.    Final Clinical Impression(s) / ED Diagnoses Final diagnoses:  None    Rx / DC Orders ED Discharge Orders     None  Maxwell Marion, PA-C 02/19/23 2324    Maxwell Marion, PA-C 02/20/23 2132    Eber Hong, MD 02/21/23 [id number]

## 2023-02-19 NOTE — ED Notes (Signed)
Patient transported to CT 

## 2023-02-19 NOTE — Discharge Instructions (Addendum)
Return to ED if your pain worsens, or confusion, weakness in one part or one side of your body, double vision or loss of vision, trouble speaking, pain in your eye or ear, trouble walking or balancing, feeling faint or passing out.

## 2023-02-19 NOTE — ED Provider Notes (Signed)
Patient is a 42 year old male presenting with severe acute onset of headache that happened several days ago with persistent right-sided headache presents with some neck pain as well, no fevers or chills, exam is unremarkable other than the feet patient feeling slightly uncomfortable.  Vitals reassuring, concern for subarachnoid, CT is negative, proceed with LP, see note below  .Lumbar Puncture  Date/Time: 02/19/2023 10:03 PM  Performed by: Eber Hong, MD Authorized by: Eber Hong, MD   Consent:    Consent obtained:  Written   Consent given by:  Patient   Risks discussed:  Bleeding, infection, pain, headache, nerve damage and repeat procedure   Alternatives discussed:  No treatment, delayed treatment and alternative treatment Universal protocol:    Procedure explained and questions answered to patient or proxy's satisfaction: yes     Relevant documents present and verified: yes     Test results available: yes     Imaging studies available: yes     Required blood products, implants, devices, and special equipment available: yes     Immediately prior to procedure a time out was called: yes     Site/side marked: yes     Patient identity confirmed:  Verbally with patient Pre-procedure details:    Procedure purpose:  Diagnostic   Preparation: Patient was prepped and draped in usual sterile fashion   Sedation:    Sedation type:  None Anesthesia:    Anesthesia method:  Local infiltration   Local anesthetic:  Lidocaine 2% WITH epi Procedure details:    Lumbar space:  L3-L4 interspace   Patient position:  L lateral decubitus   Needle gauge:  20   Needle type:  Spinal needle - Quincke tip   Needle length (in):  3.5   Ultrasound guidance: no     Number of attempts:  1   Fluid appearance:  Clear   Tubes of fluid:  4   Total volume (ml):  6 Post-procedure details:    Puncture site:  Adhesive bandage applied   Procedure completion:  Tolerated well, no immediate  complications Comments:           Eber Hong, MD 02/21/23 [phone]

## 2023-02-19 NOTE — Telephone Encounter (Signed)
Patient advised of provider's recommendations and verbalized understanding. 

## 2023-02-19 NOTE — ED Notes (Signed)
Pt has urine sample at bedside no order at this time. llisa

## 2023-02-19 NOTE — ED Triage Notes (Signed)
Pt sent by physician for severe headaches since Monday after lifting heavy weights and having a sharp pain go through his head. Worse with exertion. No n/v.  Does have light and sound sensitivity when his head pain is worse.

## 2023-02-19 NOTE — ED Provider Triage Note (Signed)
Emergency Medicine Provider Triage Evaluation Note  Logan Benton , a 42 y.o. male  was evaluated in triage.  Pt complains of headache for the past 4 days that began while exercising. He reports a pulsatile headache the occipital region of the right side of his head as well as neck stiffness. Pain worsens with exercise and improves with Acetaminophen.  Review of Systems  Positive: Pulsatile headache in right occipital region of head, neck stiffness Negative: N/V, vision changes, weakness  Physical Exam  BP (!) 162/104 (BP Location: Right Arm)   Pulse 80   Temp 98.2 F (36.8 C) (Oral)   Resp 18   SpO2 97%  Gen:   Awake, no distress   Resp:  Normal effort  MSK:   Moves extremities without difficulty  Other:    Medical Decision Making  Medically screening exam initiated at 6:25 PM.  Appropriate orders placed.  Logan Benton was informed that the remainder of the evaluation will be completed by another provider, this initial triage assessment does not replace that evaluation, and the importance of remaining in the ED until their evaluation is complete.     Maxwell Marion, PA-C 02/19/23 1835

## 2023-02-19 NOTE — Telephone Encounter (Signed)
Patient was lifting weighs at the gym on Monday and now has a headache., he states has had it since after he started working out and even when he drinks caffearine. I offered Urgent care or care anytime video visit. Explain his provider is out of office also.

## 2023-02-20 ENCOUNTER — Encounter (HOSPITAL_COMMUNITY): Payer: Self-pay | Admitting: Emergency Medicine

## 2023-02-20 ENCOUNTER — Emergency Department (HOSPITAL_COMMUNITY)
Admission: EM | Admit: 2023-02-20 | Discharge: 2023-02-20 | Disposition: A | Discharge status: Home / Self Care | Payer: 59 | Attending: Emergency Medicine | Admitting: Emergency Medicine

## 2023-02-20 DIAGNOSIS — I10 Essential (primary) hypertension: Secondary | ICD-10-CM | POA: Insufficient documentation

## 2023-02-20 DIAGNOSIS — Z79899 Other long term (current) drug therapy: Secondary | ICD-10-CM | POA: Insufficient documentation

## 2023-02-20 DIAGNOSIS — R519 Headache, unspecified: Secondary | ICD-10-CM | POA: Insufficient documentation

## 2023-02-20 MED ORDER — PROCHLORPERAZINE EDISYLATE 10 MG/2ML IJ SOLN
10.0000 mg | Freq: Once | INTRAMUSCULAR | Status: AC
  Administered 2023-02-20: 10 mg via INTRAVENOUS
  Filled 2023-02-20: qty 2

## 2023-02-20 MED ORDER — KETOROLAC TROMETHAMINE 15 MG/ML IJ SOLN
15.0000 mg | Freq: Once | INTRAMUSCULAR | Status: AC
  Administered 2023-02-20: 15 mg via INTRAVENOUS
  Filled 2023-02-20: qty 1

## 2023-02-20 MED ORDER — DEXAMETHASONE SODIUM PHOSPHATE 10 MG/ML IJ SOLN
10.0000 mg | Freq: Once | INTRAMUSCULAR | Status: AC
  Administered 2023-02-20: 10 mg via INTRAVENOUS
  Filled 2023-02-20: qty 1

## 2023-02-20 MED ORDER — SODIUM CHLORIDE 0.9 % IV BOLUS
500.0000 mL | Freq: Once | INTRAVENOUS | Status: AC
  Administered 2023-02-20: 500 mL via INTRAVENOUS

## 2023-02-20 MED ORDER — SODIUM CHLORIDE 0.9 % IV BOLUS
1000.0000 mL | Freq: Once | INTRAVENOUS | Status: AC
  Administered 2023-02-20: 1000 mL via INTRAVENOUS

## 2023-02-20 MED ORDER — DIPHENHYDRAMINE HCL 50 MG/ML IJ SOLN
12.5000 mg | Freq: Once | INTRAMUSCULAR | Status: AC
  Administered 2023-02-20: 12.5 mg via INTRAVENOUS
  Filled 2023-02-20: qty 1

## 2023-02-20 MED ORDER — SODIUM CHLORIDE 0.9 % IV SOLN: INTRAVENOUS | Status: DC

## 2023-02-20 NOTE — ED Triage Notes (Signed)
Pt presents with headache since Monday, seen in this ED on yesterday, per pt CT and lumbar puncture performed, today has same headache, worse after taking Fioricet.

## 2023-02-20 NOTE — ED Notes (Signed)
ED Provider at bedside. 

## 2023-02-20 NOTE — ED Provider Notes (Signed)
  Physical Exam  BP 118/81 (BP Location: Right Arm)   Pulse 60   Temp 97.7 F (36.5 C) (Oral)   Resp 16   SpO2 96%   Physical Exam Constitutional:      General: He is not in acute distress.    Appearance: Normal appearance.  HENT:     Head: Normocephalic and atraumatic.     Nose: No congestion or rhinorrhea.  Eyes:     General:        Right eye: No discharge.        Left eye: No discharge.     Extraocular Movements: Extraocular movements intact.     Pupils: Pupils are equal, round, and reactive to light.  Cardiovascular:     Rate and Rhythm: Normal rate and regular rhythm.     Heart sounds: No murmur heard. Pulmonary:     Effort: No respiratory distress.     Breath sounds: No wheezing or rales.  Abdominal:     General: There is no distension.     Tenderness: There is no abdominal tenderness.  Musculoskeletal:        General: Normal range of motion.     Cervical back: Normal range of motion.  Skin:    General: Skin is warm and dry.  Neurological:     General: No focal deficit present.     Mental Status: He is alert.     Procedures  Procedures  ED Course / MDM    Medical Decision Making Amount and/or Complexity of Data Reviewed Labs: ordered. Radiology: ordered.  Risk Prescription drug management.   Patient received in handoff.  Headache with negative CT imaging pending results of LP to rule out subarachnoid hemorrhage.  The results were reassuringly negative with no significant xanthochromia or red blood cells.  No evidence of significant leukocytosis in the CSF and I have low suspicion for meningitis.  Patient then discharged with outpatient follow-up as headache appears to have improved here in the ER.       Glendora Score, MD 02/20/23 [phone]

## 2023-02-20 NOTE — Discharge Instructions (Signed)
Evaluation of your headache was overall reassuring.  Likely migraine, encouraged that your symptoms improved with headache cocktail.  Advise you follow-up with your PCP.  If you have worsening headache, facial droop, slurred speech, numbness or tingling in extremities or any other concerning symptom please return emerged part for further evaluation.

## 2023-02-20 NOTE — ED Provider Notes (Signed)
[place] EMERGENCY DEPARTMENT AT [facility] Provider Note   CSN: [id number] Arrival date & time: 02/20/23  1140     History  Chief Complaint  Patient presents with   Headache   HPI Logan Benton is a 42 y.o. male with hypertension presenting for headache.  Started Monday acutely while lifting weights.  It is pulsatile.  Located in a bandlike distribution starting in his forehead extending to his neck endorses associated photophobia and phonophobia.  Denies nuchal rigidity today and fever.  Denies visual disturbance.  Was seen for same yesterday there was concern for [facility] but CT and LP were negative.  Vomited once this morning and is nauseous.   Headache      Home Medications Prior to Admission medications   Medication Sig Start Date End Date Taking? Authorizing Provider  butalbital-acetaminophen-caffeine (FIORICET) 50-325-40 MG tablet Take 1-2 tablets by mouth every 6 (six) hours as needed for headache. 02/19/23 02/19/24  Eber Hong, MD  cyclobenzaprine (FLEXERIL) 10 MG tablet Take 1 tablet (10 mg total) by mouth 2 (two) times daily as needed for up to 10 days for muscle spasms. 02/19/23 03/01/23  Eber Hong, MD  metoprolol succinate (TOPROL-XL) 25 MG 24 hr tablet Take 1 tablet by mouth once daily 02/09/23   Tommie Sams, DO  Multiple Vitamin (MULTIVITAMIN) tablet Take 1 tablet by mouth daily.    [provider]  naproxen (NAPROSYN) 500 MG tablet Take 1 tablet (500 mg total) by mouth 2 (two) times daily with a meal. 02/19/23   Eber Hong, MD  NON FORMULARY Oil of oregano, black seed oil,    [provider]  Probiotic Product (UP4 PROBIOTICS MENS) CAPS Take 1 capsule by mouth daily as needed. 07/21/22   [employer], Alvino Chapel, FNP  vitamin B-12 (CYANOCOBALAMIN) 500 MCG tablet Take 500 mcg by mouth daily.    [provider]  zinc gluconate 50 MG tablet Take 50 mg by mouth daily.    [provider]      Allergies    Patient has no  known allergies.    Review of Systems   Review of Systems  Neurological:  Positive for headaches.    Physical Exam   Vitals:   02/20/23 1142  BP: (!) 143/83  Pulse: 61  Resp: 16  Temp: 97.7 F (36.5 C)  SpO2: 100%    CONSTITUTIONAL:  well-appearing, NAD NEURO:  GCS 15. Speech is goal oriented. No deficits appreciated to CN III-XII; symmetric eyebrow raise, no facial drooping, tongue midline. Patient has equal grip strength bilaterally with 5/5 strength against resistance in all major muscle groups bilaterally. Sensation to light touch intact. Patient moves extremities without ataxia. Normal finger-nose-finger. Patient ambulatory with steady gait. EYES:  eyes equal and reactive ENT/NECK:  Supple, no stridor  CARDIO:  Regular rate and rhythm, appears well-perfused  PULM:  No respiratory distress, CTAB GI/GU:  non-distended, soft MSK/SPINE:  No gross deformities, no edema, moves all extremities  SKIN:  no rash, atraumatic  *Additional and/or pertinent findings included in MDM below   ED Results / Procedures / Treatments   Labs (all labs ordered are listed, but only abnormal results are displayed) Labs Reviewed - No data to display  EKG None  Radiology CT Angio Head W or Wo Contrast  Result Date: 02/19/2023 CLINICAL DATA:  Headache, sudden and severe. Severe headache since Monday after lifting heavy weights. EXAM: CT ANGIOGRAPHY HEAD TECHNIQUE: Multidetector CT imaging of the head was performed using the  standard protocol during bolus administration of intravenous contrast. Multiplanar CT image reconstructions and MIPs were obtained to evaluate the vascular anatomy. RADIATION DOSE REDUCTION: This exam was performed according to the departmental dose-optimization program which includes automated exposure control, adjustment of the mA and/or kV according to patient size and/or use of iterative reconstruction technique. CONTRAST:  75mL OMNIPAQUE IOHEXOL 350 MG/ML SOLN COMPARISON:   None Available. FINDINGS: CT HEAD Brain: No evidence of acute infarction, hemorrhage, hydrocephalus, extra-axial collection or mass lesion/mass effect. Vascular: See below Skull: Normal. Negative for fracture or focal lesion. Sinuses: No acute or significant finding. CTA HEAD Anterior circulation: No significant stenosis, proximal occlusion, aneurysm, or vascular malformation. Posterior circulation: No significant stenosis, proximal occlusion, aneurysm, or vascular malformation. Venous sinuses: Diffusely patent Anatomic variants: None significant Review of the MIP images confirms the above findings. IMPRESSION: Normal head CTA. Electronically Signed   By: Tiburcio Pea M.D.   On: 02/19/2023 21:22    Procedures Procedures    Medications Ordered in ED Medications  sodium chloride 0.9 % bolus 1,000 mL (0 mLs Intravenous Stopped 02/20/23 1303)    And  0.9 %  sodium chloride infusion ( Intravenous New Bag/Given 02/20/23 1341)  ketorolac (TORADOL) 15 MG/ML injection 15 mg (15 mg Intravenous Given 02/20/23 1214)  prochlorperazine (COMPAZINE) injection 10 mg (10 mg Intravenous Given 02/20/23 1211)  diphenhydrAMINE (BENADRYL) injection 12.5 mg (12.5 mg Intravenous Given 02/20/23 1213)  dexamethasone (DECADRON) injection 10 mg (10 mg Intravenous Given 02/20/23 1215)  sodium chloride 0.9 % bolus 500 mL (0 mLs Intravenous Stopped 02/20/23 1341)    ED Course/ Medical Decision Making/ A&P                             Medical Decision Making Risk Prescription drug management.   42 year old well-appearing male presenting for headache. Exam is unremarkable.  DDx includes migraine, tension type, cluster headache, hypertensive emergency. Yesterday was seen for same and CTA and LP were negative at that time making meningitis and SAH unlikely. Given that he has had photophobia and phonophobia and persistence of his symptoms it is likely migraine. Treated with headache cocktail.  States he felt much better after  treatment.  Advised to follow-up with PCP. Vitals remained stable throughout encounter.  Discussed pertinent return precautions. Discharged home.        Final Clinical Impression(s) / ED Diagnoses Final diagnoses:  None    Rx / DC Orders ED Discharge Orders     None         Gareth Eagle, PA-C 02/20/23 1533    Eber Hong, MD 02/21/23 [phone]

## 2023-02-22 LAB — BODY FLUID CULTURE W GRAM STAIN

## 2023-02-23 ENCOUNTER — Encounter: Payer: Self-pay | Admitting: Family Medicine

## 2023-02-23 LAB — BODY FLUID CULTURE W GRAM STAIN
Culture: NO GROWTH
Gram Stain: NONE SEEN

## 2023-02-26 ENCOUNTER — Ambulatory Visit: Payer: 59 | Admitting: Family Medicine

## 2023-02-26 ENCOUNTER — Ambulatory Visit: Payer: 59

## 2023-02-26 ENCOUNTER — Encounter: Payer: Self-pay | Admitting: Family Medicine

## 2023-02-26 VITALS — BP 127/86 | HR 84 | Temp 98.2°F | Ht 71.0 in | Wt 167.0 lb

## 2023-02-26 DIAGNOSIS — M542 Cervicalgia: Secondary | ICD-10-CM

## 2023-02-26 MED ORDER — FLUCONAZOLE 200 MG PO TABS: 200.0000 mg | ORAL_TABLET | Freq: Every day | ORAL | 0 refills | Status: DC

## 2023-02-26 MED ORDER — TRAMADOL HCL 50 MG PO TABS: 50.0000 mg | ORAL_TABLET | Freq: Three times a day (TID) | ORAL | 0 refills | Status: AC | PRN

## 2023-02-26 MED ORDER — PREDNISONE 10 MG (21) PO TBPK: ORAL_TABLET | ORAL | 0 refills | Status: DC

## 2023-02-26 NOTE — Patient Instructions (Signed)
Rest. Heat.  Medications as prescribed.  Take care   Dr. Forney Kleinpeter  

## 2023-03-01 ENCOUNTER — Encounter: Payer: Self-pay | Admitting: Family Medicine

## 2023-03-02 DIAGNOSIS — M542 Cervicalgia: Secondary | ICD-10-CM | POA: Insufficient documentation

## 2023-03-02 NOTE — Progress Notes (Signed)
Subjective:  Patient ID: Logan Benton, male    DOB: 21-Dec-1980  Age: 42 y.o. MRN: [id number]  CC: Chief Complaint  Patient presents with   Headache    ER follow up - started last week with weight lifting strain to L side neck and shooting pain to head- current pain a back of base of the neck and head - also reports pain behind the eyes- progressively worse throughout the day    HPI:  42 year old male presents for ER follow up.  Patient recent seen in the ER twice for headache (5/16 and 5/17). Has CT angio head which was normal. Also had lumbar puncture that was negative. Was treated initially with Fioricet, Naproxen and flexeril. At visit on 5/17 was treated with migraine cocktail.  Presents today having continued posterior neck pain (left) which goes up and causes headache. No relief with prescribed medication. Worse with ROM/activity.   Patient Active Problem List   Diagnosis Date Noted   Neck pain 03/02/2023   Near syncope 03/23/2022   Insomnia 03/21/2021   IBS (irritable bowel syndrome) 02/27/2021   Primary hypertension 02/26/2021   Barrett's esophagus 12/31/2020    Social Hx   Social History   Socioeconomic History   Marital status: Divorced    Spouse name: Not on file   Number of children: Not on file   Years of education: Not on file   Highest education level: 12th grade  Occupational History   Not on file  Tobacco Use   Smoking status: Former    Packs/day: 0.00    Years: 0.00    Additional pack years: 0.00    Total pack years: 0.00    Types: Cigarettes    Quit date: 11/03/2019    Years since quitting: 3.3    Passive exposure: Past   Smokeless tobacco: Never   Tobacco comments:    nicoten gum  Vaping Use   Vaping Use: Former   Quit date: 03/07/2019  Substance and Sexual Activity   Alcohol use: No   Drug use: Yes    Types: Marijuana   Sexual activity: Not Currently    Partners: Female  Other Topics Concern   Not on file  Social History  Narrative   Not on file   Social Determinants of Health   Financial Resource Strain: Low Risk  (02/22/2023)   Overall Financial Resource Strain (CARDIA)    Difficulty of Paying Living Expenses: Not very hard  Food Insecurity: No Food Insecurity (02/22/2023)   Hunger Vital Sign    Worried About Running Out of Food in the Last Year: Never true    Ran Out of Food in the Last Year: Never true  Transportation Needs: No Transportation Needs (02/22/2023)   PRAPARE - Administrator, Civil Service (Medical): No    Lack of Transportation (Non-Medical): No  Physical Activity: Sufficiently Active (02/22/2023)   Exercise Vital Sign    Days of Exercise per Week: 3 days    Minutes of Exercise per Session: 130 min  Stress: No Stress Concern Present (02/22/2023)   [employer] of Occupational Health - Occupational Stress Questionnaire    Feeling of Stress : Not at all  Social Connections: Unknown (02/22/2023)   Social Connection and Isolation Panel [NHANES]    Frequency of Communication with Friends and Family: Patient declined    Frequency of Social Gatherings with Friends and Family: Patient declined    Attends Religious Services: Patient declined    Active  Member of Clubs or Organizations: No    Attends Engineer, structural: Not on file    Marital Status: Divorced    Review of Systems Per HPI  Objective:  BP 127/86   Pulse 84   Temp 98.2 F (36.8 C)   Ht 5\' 11"  (1.803 m)   Wt 167 lb (75.8 kg)   SpO2 97%   BMI 23.29 kg/m      02/26/2023    3:35 PM 02/20/2023    3:59 PM 02/20/2023    3:26 PM  BP/Weight  Systolic BP 127 106 118  Diastolic BP 86 66 76  Wt. (Lbs) 167    BMI 23.29 kg/m2      Physical Exam Vitals and nursing note reviewed.  Constitutional:      General: He is not in acute distress.    Appearance: Normal appearance.  HENT:     Head: Normocephalic and atraumatic.  Neck:     Comments: Tension posteriorly. Cardiovascular:     Rate and  Rhythm: Normal rate and regular rhythm.  Pulmonary:     Effort: Pulmonary effort is normal.     Breath sounds: Normal breath sounds. No wheezing, rhonchi or rales.  Neurological:     Mental Status: He is alert.     Lab Results  Component Value Date   WBC 8.3 02/19/2023   HGB 16.2 02/19/2023   HCT 47.8 02/19/2023   PLT 277 02/19/2023   GLUCOSE 106 (H) 02/19/2023   CHOL 163 06/23/2022   TRIG 53 06/23/2022   HDL 85 06/23/2022   LDLCALC 67 06/23/2022   ALT 72 (H) 02/19/2023   AST 34 02/19/2023   NA 135 02/19/2023   K 3.9 02/19/2023   CL 102 02/19/2023   CREATININE 1.00 02/19/2023   BUN 36 (H) 02/19/2023   CO2 24 02/19/2023   TSH 1.060 06/23/2022   INR 0.99 10/03/2010   HGBA1C 5.1 06/23/2022     Assessment & Plan:   Problem List Items Addressed This Visit       Other   Neck pain - Primary    Treating with Prednisone and Tramadol.        Meds ordered this encounter  Medications   predniSONE (STERAPRED UNI-PAK 21 TAB) 10 MG (21) TBPK tablet    Sig: 6 tablets on day 1; decrease by 1 tablet daily until gone.    Dispense:  21 tablet    Refill:  0   fluconazole (DIFLUCAN) 200 MG tablet    Sig: Take 1 tablet (200 mg total) by mouth daily. If thrush develops.    Dispense:  7 tablet    Refill:  0   traMADol (ULTRAM) 50 MG tablet    Sig: Take 1-2 tablets (50-100 mg total) by mouth every 8 (eight) hours as needed for up to 5 days for severe pain or moderate pain.    Dispense:  15 tablet    Refill:  0    Follow-up:  Return if symptoms worsen or fail to improve.  Everlene Other DO [facility]

## 2023-03-02 NOTE — Assessment & Plan Note (Signed)
Treating with Prednisone and Tramadol.

## 2023-03-03 ENCOUNTER — Other Ambulatory Visit: Payer: Self-pay

## 2023-03-03 DIAGNOSIS — R197 Diarrhea, unspecified: Secondary | ICD-10-CM

## 2023-03-04 NOTE — Progress Notes (Signed)
[facility] Summary Report / Loop Recorder 

## 2023-03-05 DIAGNOSIS — R197 Diarrhea, unspecified: Secondary | ICD-10-CM | POA: Diagnosis not present

## 2023-03-07 LAB — STOOL CULTURE: E coli, Shiga toxin Assay: NEGATIVE

## 2023-03-08 LAB — STOOL CULTURE

## 2023-03-09 LAB — STOOL CULTURE

## 2023-03-10 LAB — OVA AND PARASITE EXAMINATION

## 2023-03-16 ENCOUNTER — Ambulatory Visit: Payer: 59

## 2023-03-30 ENCOUNTER — Ambulatory Visit: Payer: 59

## 2023-04-03 ENCOUNTER — Encounter: Payer: Self-pay | Admitting: Family Medicine

## 2023-04-06 ENCOUNTER — Other Ambulatory Visit: Payer: Self-pay | Admitting: Family Medicine

## 2023-04-06 ENCOUNTER — Ambulatory Visit (INDEPENDENT_AMBULATORY_CARE_PROVIDER_SITE_OTHER): Payer: 59

## 2023-04-06 DIAGNOSIS — Z202 Contact with and (suspected) exposure to infections with a predominantly sexual mode of transmission: Secondary | ICD-10-CM

## 2023-04-06 DIAGNOSIS — R55 Syncope and collapse: Secondary | ICD-10-CM

## 2023-04-07 LAB — CUP PACEART REMOTE DEVICE CHECK
Date Time Interrogation Session: 20240701185600
Implantable Pulse Generator Implant Date: 20230621
Pulse Gen Serial Number: 177683

## 2023-04-08 DIAGNOSIS — Z202 Contact with and (suspected) exposure to infections with a predominantly sexual mode of transmission: Secondary | ICD-10-CM | POA: Diagnosis not present

## 2023-04-09 LAB — HIV ANTIBODY (ROUTINE TESTING W REFLEX): HIV Screen 4th Generation wRfx: NONREACTIVE

## 2023-04-09 LAB — HEPATITIS C ANTIBODY: Hep C Virus Ab: NONREACTIVE

## 2023-04-09 LAB — RPR: RPR Ser Ql: NONREACTIVE

## 2023-04-10 LAB — CHLAMYDIA/GONOCOCCUS/TRICHOMONAS, NAA
Chlamydia by NAA: NEGATIVE
Gonococcus by NAA: NEGATIVE
Trich vag by NAA: NEGATIVE

## 2023-04-16 ENCOUNTER — Ambulatory Visit: Payer: 59

## 2023-04-27 NOTE — Progress Notes (Signed)
[place] Loop Recorder 

## 2023-04-30 ENCOUNTER — Ambulatory Visit: Payer: 59

## 2023-05-02 ENCOUNTER — Other Ambulatory Visit: Payer: Self-pay | Admitting: Family Medicine

## 2023-05-07 ENCOUNTER — Ambulatory Visit: Payer: 59

## 2023-05-18 ENCOUNTER — Ambulatory Visit: Payer: 59

## 2023-06-01 ENCOUNTER — Ambulatory Visit: Payer: 59

## 2023-06-04 ENCOUNTER — Ambulatory Visit
Admission: EM | Admit: 2023-06-04 | Discharge: 2023-06-04 | Disposition: A | Discharge status: Home / Self Care | Payer: 59 | Attending: Family Medicine | Admitting: Family Medicine

## 2023-06-04 DIAGNOSIS — N5082 Scrotal pain: Secondary | ICD-10-CM | POA: Diagnosis not present

## 2023-06-04 DIAGNOSIS — Z113 Encounter for screening for infections with a predominantly sexual mode of transmission: Secondary | ICD-10-CM

## 2023-06-04 NOTE — ED Triage Notes (Signed)
Pt reports penis pain, swelling, burning and sensation in penis x 2 days.   Last unprotected encounter was x 2 weeks.

## 2023-06-05 LAB — SYPHILIS: RPR W/REFLEX TO RPR TITER AND TREPONEMAL ANTIBODIES, TRADITIONAL SCREENING AND DIAGNOSIS ALGORITHM: RPR Ser Ql: NONREACTIVE

## 2023-06-05 LAB — HIV ANTIBODY (ROUTINE TESTING W REFLEX): HIV Screen 4th Generation wRfx: NONREACTIVE

## 2023-06-08 NOTE — ED Provider Notes (Signed)
[facility]    CSN: [id number] Arrival date & time: 06/04/23  1758      History   Chief Complaint No chief complaint on file.   HPI Logan Benton is a 42 y.o. male.   Presenting today with 2-day history of dysuria, burning sensation in penis.  Denies rashes or lesions, penile discharge, abdominal pain, fevers, nausea, vomiting.  So far not sure anything over-the-counter for symptoms.  Last unprotected sexual encounter was 2 weeks ago and he is requesting STD testing.    Past Medical History:  Diagnosis Date   Abscessed tooth    C. difficile diarrhea    presumptive diagnosis   Hypertension    Trigger finger     Patient Active Problem List   Diagnosis Date Noted   Neck pain 03/02/2023   Near syncope 03/23/2022   Insomnia 03/21/2021   IBS (irritable bowel syndrome) 02/27/2021   Primary hypertension 02/26/2021   Barrett's esophagus 12/31/2020    Past Surgical History:  Procedure Laterality Date   BILATERAL CARPAL TUNNEL RELEASE     BIOPSY  06/29/2020   Procedure: BIOPSY;  Surgeon: Lanelle Bal, DO;  Location: [facility];  Service: Endoscopy;;   BIOPSY  09/03/2020   Procedure: BIOPSY;  Surgeon: Lanelle Bal, DO;  Location: [facility];  Service: Endoscopy;;   BIOPSY  03/08/2021   Procedure: BIOPSY;  Surgeon: Lanelle Bal, DO;  Location: [facility];  Service: Endoscopy;;   BIOPSY  05/10/2021   Procedure: BIOPSY;  Surgeon: Lanelle Bal, DO;  Location: [facility];  Service: Endoscopy;;   CHOLECYSTECTOMY N/A 04/25/2022   Procedure: LAPAROSCOPIC CHOLECYSTECTOMY;  Surgeon: Lucretia Roers, MD;  Location: [facility];  Service: General;  Laterality: N/A;   COLONOSCOPY WITH PROPOFOL N/A 06/29/2020   Dr. Marletta Lor: 4 mm tubular adenoma removed, nonbleeding internal hemorrhoids.  Repeat colonoscopy in 7 years.   COLONOSCOPY WITH PROPOFOL N/A 05/10/2021   Procedure: COLONOSCOPY WITH PROPOFOL;  Surgeon: Lanelle Bal, DO;  [facility];  Service: Endoscopy;  Laterality: N/A;  1:15pm   ESOPHAGOGASTRODUODENOSCOPY (EGD) WITH PROPOFOL N/A 06/29/2020   Dr. Marletta Lor: LA grade C esophagitis, gastritis with mild chronic gastritis on path, small bowel biopsy unremarkable.   ESOPHAGOGASTRODUODENOSCOPY (EGD) WITH PROPOFOL N/A 09/03/2020   Dr. Marletta Lor: Esophageal mucosal changes suggestive of short segment Barrett's esophagus, biopsies consistent.  Next EGD in 3 years   ESOPHAGOGASTRODUODENOSCOPY (EGD) WITH PROPOFOL N/A 03/08/2021   Procedure: ESOPHAGOGASTRODUODENOSCOPY (EGD) WITH PROPOFOL;  Surgeon: Lanelle Bal, DO;  [facility]: [facility];  Service: Endoscopy;  Laterality: N/A;  1:00pm   LOOP RECORDER INSERTION N/A 03/26/2022   Procedure: LOOP RECORDER INSERTION;  Surgeon: Regan Lemming, MD;  Location: [facility];  Service: Cardiovascular;  Laterality: N/A;   MASS EXCISION Right 04/25/2022   Procedure: EXCISION MASS UPPER RIGHT ABDOMEN;  Surgeon: Lucretia Roers, MD;  Location: [facility];  Service: General;  Laterality: Right;   POLYPECTOMY  06/29/2020   Procedure: POLYPECTOMY;  Surgeon: Lanelle Bal, DO;  Location[facility];  Service: Endoscopy;;   SHOULDER SURGERY Right    x2   TRIGGER FINGER RELEASE Right 2016       Home Medications    Prior to Admission medications   Medication Sig Start Date End Date Taking? Authorizing Provider  fluconazole (DIFLUCAN) 200 MG tablet Take 1 tablet (200 mg total) by mouth daily. If thrush develops. 02/26/23   Tommie Sams,  DO  metoprolol succinate (TOPROL-XL) 25 MG 24 hr tablet Take 1 tablet by mouth once daily 05/04/23   Tommie Sams, DO  Multiple Vitamin (MULTIVITAMIN) tablet Take 1 tablet by mouth daily.    [provider]  predniSONE (STERAPRED UNI-PAK 21 TAB) 10 MG (21) TBPK tablet 6 tablets on day 1; decrease by 1 tablet daily until gone. 02/26/23   Tommie Sams, DO  Probiotic Product (UP4 PROBIOTICS MENS) CAPS Take 1 capsule by mouth daily as  needed. 07/21/22   [employer], Alvino Chapel, FNP  vitamin B-12 (CYANOCOBALAMIN) 500 MCG tablet Take 500 mcg by mouth daily.    [provider]  zinc gluconate 50 MG tablet Take 50 mg by mouth daily.    [provider]    Family History Family History  Problem Relation Age of Onset   Rheum arthritis Mother    Bladder Cancer Mother    Kidney disease Father    Bladder Cancer Maternal Grandfather    Irritable bowel syndrome Paternal Grandmother    Colon cancer Neg Hx    Celiac disease Neg Hx    Inflammatory bowel disease Neg Hx     Social History Social History   Tobacco Use   Smoking status: Former    Current packs/day: 0.00    Types: Cigarettes    Quit date: 11/03/2019    Years since quitting: 3.5    Passive exposure: Past   Smokeless tobacco: Never   Tobacco comments:    nicoten gum  Vaping Use   Vaping status: Former   Quit date: 03/07/2019  Substance Use Topics   Alcohol use: No   Drug use: Yes    Types: Marijuana     Allergies   Nsaids   Review of Systems Review of Systems Per HPI  Physical Exam Triage Vital Signs ED Triage Vitals  Encounter Vitals Group     BP 06/04/23 1825 132/82     Systolic BP Percentile --      Diastolic BP Percentile --      Pulse Rate 06/04/23 1825 (!) 56     Resp 06/04/23 1825 18     Temp 06/04/23 1825 98 F (36.7 C)     Temp Source 06/04/23 1825 Oral     SpO2 06/04/23 1825 98 %     Weight --      Height --      Head Circumference --      Peak Flow --      Pain Score 06/04/23 1823 1     Pain Loc --      Pain Education --      Exclude from Growth Chart --    No data found.  Updated Vital Signs BP 132/82 (BP Location: Right Arm)   Pulse (!) 56   Temp 98 F (36.7 C) (Oral)   Resp 18   SpO2 98%   Visual Acuity Right Eye Distance:   Left Eye Distance:   Bilateral Distance:    Right Eye Near:   Left Eye Near:    Bilateral Near:     Physical Exam Vitals and nursing note reviewed.  Constitutional:       Appearance: Normal appearance.  HENT:     Head: Atraumatic.     Mouth/Throat:     Mouth: Mucous membranes are moist.  Eyes:     Extraocular Movements: Extraocular movements intact.     Conjunctiva/sclera: Conjunctivae normal.  Cardiovascular:     Rate and Rhythm: Normal  rate and regular rhythm.  Pulmonary:     Effort: Pulmonary effort is normal.     Breath sounds: Normal breath sounds.  Abdominal:     General: Bowel sounds are normal. There is no distension.     Palpations: Abdomen is soft.     Tenderness: There is no abdominal tenderness. There is no guarding.  Genitourinary:    Comments: GU exam deferred, self swab performed Musculoskeletal:        General: Normal range of motion.     Cervical back: Normal range of motion and neck supple.  Skin:    General: Skin is warm and dry.  Neurological:     General: No focal deficit present.     Mental Status: He is oriented to person, place, and time.  Psychiatric:        Mood and Affect: Mood normal.        Thought Content: Thought content normal.        Judgment: Judgment normal.      UC Treatments / Results  Labs (all labs ordered are listed, but only abnormal results are displayed) Labs Reviewed  HIV ANTIBODY (ROUTINE TESTING W REFLEX)   Narrative:    Performed at:  [address] [facility] [address] [place] [employer] [place], [place]  [id number] Lab Director: Jolene Schimke MD, Phone:  [phone]  [facility]   Narrative:    Performed at:  [address] [place] [address], [place], [place]  [id number] Lab Director: Jolene Schimke MD, Phone:  [phone]  CYTOLOGY, (ORAL, ANAL, URETHRAL) ANCILLARY ONLY    EKG   Radiology No results found.  Procedures Procedures (including critical care time)  Medications Ordered in UC Medications - No data to display  Initial Impression / Assessment and Plan / UC Course  I have reviewed the triage vital signs and the nursing notes.  Pertinent labs & imaging results that were  available during my care of the patient were reviewed by me and considered in my medical decision making (see chart for details).     Cytology swab and HIV and syphilis labs pending.  He declines any sort of medication until results return.  Discussed abortive over-the-counter medications, home care and return precautions while awaiting results.  Final Clinical Impressions(s) / UC Diagnoses   Final diagnoses:  Scrotal pain  Screening examination for STI   Discharge Instructions   None    ED Prescriptions   None    PDMP not reviewed this encounter.   Particia Nearing, [place] 06/08/23 1742

## 2023-06-11 LAB — CYTOLOGY, (ORAL, ANAL, URETHRAL) ANCILLARY ONLY
Chlamydia: NEGATIVE
Comment: NEGATIVE
Comment: NEGATIVE
Comment: NORMAL
Neisseria Gonorrhea: NEGATIVE
Trichomonas: NEGATIVE

## 2023-06-17 ENCOUNTER — Ambulatory Visit (INDEPENDENT_AMBULATORY_CARE_PROVIDER_SITE_OTHER): Payer: 59

## 2023-06-17 DIAGNOSIS — R55 Syncope and collapse: Secondary | ICD-10-CM

## 2023-06-17 LAB — CUP PACEART REMOTE DEVICE CHECK
Date Time Interrogation Session: 20240911053700
Implantable Pulse Generator Implant Date: 20230621
Pulse Gen Serial Number: 177683

## 2023-06-18 ENCOUNTER — Ambulatory Visit: Payer: 59

## 2023-06-25 ENCOUNTER — Encounter: Payer: Self-pay | Admitting: Family Medicine

## 2023-06-25 ENCOUNTER — Ambulatory Visit: Payer: 59 | Admitting: Family Medicine

## 2023-06-25 VITALS — BP 136/76 | HR 60 | Temp 98.8°F | Ht 71.0 in | Wt 173.0 lb

## 2023-06-25 DIAGNOSIS — I10 Essential (primary) hypertension: Secondary | ICD-10-CM

## 2023-06-25 DIAGNOSIS — R682 Dry mouth, unspecified: Secondary | ICD-10-CM

## 2023-06-25 DIAGNOSIS — Z202 Contact with and (suspected) exposure to infections with a predominantly sexual mode of transmission: Secondary | ICD-10-CM | POA: Diagnosis not present

## 2023-06-25 DIAGNOSIS — K529 Noninfective gastroenteritis and colitis, unspecified: Secondary | ICD-10-CM | POA: Diagnosis not present

## 2023-06-25 DIAGNOSIS — R5382 Chronic fatigue, unspecified: Secondary | ICD-10-CM | POA: Diagnosis not present

## 2023-06-25 NOTE — Patient Instructions (Signed)
Labs for further evaluation.   We will call with results.  Take care  Dr. Adriana Simas

## 2023-06-26 LAB — CMP14+EGFR
ALT: 167 IU/L — ABNORMAL HIGH (ref 0–44)
AST: 34 IU/L (ref 0–40)
Albumin: 4.9 g/dL (ref 4.1–5.1)
Alkaline Phosphatase: 134 IU/L — ABNORMAL HIGH (ref 44–121)
BUN/Creatinine Ratio: 18 (ref 9–20)
BUN: 21 mg/dL — ABNORMAL HIGH (ref 6–20)
Bilirubin Total: 0.4 mg/dL (ref 0.0–1.2)
CO2: 25 mmol/L (ref 20–29)
Calcium: 10.2 mg/dL (ref 8.7–10.2)
Chloride: 96 mmol/L (ref 96–106)
Creatinine, Ser: 1.19 mg/dL (ref 0.76–1.27)
Globulin, Total: 2.2 g/dL (ref 1.5–4.5)
Glucose: 95 mg/dL (ref 70–99)
Potassium: 4.6 mmol/L (ref 3.5–5.2)
Sodium: 140 mmol/L (ref 134–144)
Total Protein: 7.1 g/dL (ref 6.0–8.5)
eGFR: 80 mL/min/{1.73_m2} (ref >59)

## 2023-06-26 LAB — SJOGRENS SYNDROME-B EXTRACTABLE NUCLEAR ANTIBODY: ENA SSB (LA) Ab: <0.2 AI (ref 0.0–0.9)

## 2023-06-26 LAB — CBC
Hematocrit: 52.7 % — ABNORMAL HIGH (ref 37.5–51.0)
Hemoglobin: 17.1 g/dL (ref 13.0–17.7)
MCH: 30.9 pg (ref 26.6–33.0)
MCHC: 32.4 g/dL (ref 31.5–35.7)
MCV: 95 fL (ref 79–97)
Platelets: 242 10*3/uL (ref 150–450)
RBC: 5.53 x10E6/uL (ref 4.14–5.80)
RDW: 11.8 % (ref 11.6–15.4)
WBC: 5.5 10*3/uL (ref 3.4–10.8)

## 2023-06-26 LAB — SEDIMENTATION RATE: Sed Rate: 2 mm/hr (ref 0–15)

## 2023-06-26 LAB — SJOGRENS SYNDROME-A EXTRACTABLE NUCLEAR ANTIBODY: ENA SSA (RO) Ab: <0.2 AI (ref 0.0–0.9)

## 2023-06-26 LAB — VITAMIN D 25 HYDROXY (VIT D DEFICIENCY, FRACTURES): Vit D, 25-Hydroxy: 52.6 ng/mL (ref 30.0–100.0)

## 2023-06-26 LAB — HIV ANTIBODY (ROUTINE TESTING W REFLEX): HIV Screen 4th Generation wRfx: NONREACTIVE

## 2023-06-26 LAB — VITAMIN B12: Vitamin B-12: 1761 pg/mL — ABNORMAL HIGH (ref 232–1245)

## 2023-06-26 LAB — TSH: TSH: 2.43 u[IU]/mL (ref 0.450–4.500)

## 2023-06-26 LAB — RPR: RPR Ser Ql: NONREACTIVE

## 2023-06-28 DIAGNOSIS — R5382 Chronic fatigue, unspecified: Secondary | ICD-10-CM | POA: Insufficient documentation

## 2023-06-28 NOTE — Assessment & Plan Note (Signed)
Patient presents with a myriad of symptoms.  Etiology unclear.  Normal exam.  Labs obtained for further evaluation today.

## 2023-06-28 NOTE — Progress Notes (Signed)
Subjective:  Patient ID: Logan Benton, male    DOB: [birth year]  Age: 42 y.o. MRN: [id number]  CC: Chief Complaint  Patient presents with   thrush in mouth     Going on and off for years now, flares intermittently - current symptoms are burning in mouth and throat, headache, brain fog , abd cramping, possible yeast in stool , requesting testing swab and antifungal long term medication    HPI:  42 year old male presents for evaluation of the above.  Patient reports that he has had a constellation of symptoms ongoing for the past couple of years.  Patient reports that he has a burning in his mouth and throat and believes that he had thrush.  He states that he has responded to antifungals in the past but without resolution.  He reports that he also has brain fog, headache, difficulty sleeping, fatigue, abdominal cramps, change in his stool.  Patient has done some research online and feels like he has fungal overgrowth.  He is concerned that he has thrush and this is the cause of his symptoms.  He states that he is most bothered by "brain fog".  Patient Active Problem List   Diagnosis Date Noted   Chronic fatigue 06/28/2023   Neck pain 03/02/2023   Near syncope 03/23/2022   Insomnia 03/21/2021   IBS (irritable bowel syndrome) 02/27/2021   Primary hypertension 02/26/2021   Barrett's esophagus 12/31/2020    Social Hx   Social History   Socioeconomic History   Marital status: Divorced    Spouse name: Not on file   Number of children: Not on file   Years of education: Not on file   Highest education level: 12th grade  Occupational History   Not on file  Tobacco Use   Smoking status: Former    Current packs/day: 0.00    Types: Cigarettes    Quit date: 11/03/2019    Years since quitting: 3.6    Passive exposure: Past   Smokeless tobacco: Never   Tobacco comments:    nicoten gum  Vaping Use   Vaping status: Former   Quit date: 03/07/2019  Substance and Sexual Activity    Alcohol use: No   Drug use: Yes    Types: Marijuana   Sexual activity: Not Currently    Partners: Female  Other Topics Concern   Not on file  Social History Narrative   Not on file   Social Determinants of Health   Financial Resource Strain: Low Risk  (02/22/2023)   Overall Financial Resource Strain (CARDIA)    Difficulty of Paying Living Expenses: Not very hard  Food Insecurity: No Food Insecurity (02/22/2023)   Hunger Vital Sign    Worried About Running Out of Food in the Last Year: Never true    Ran Out of Food in the Last Year: Never true  Transportation Needs: No Transportation Needs (02/22/2023)   PRAPARE - Administrator, Civil Service (Medical): No    Lack of Transportation (Non-Medical): No  Physical Activity: Sufficiently Active (02/22/2023)   Exercise Vital Sign    Days of Exercise per Week: 3 days    Minutes of Exercise per Session: 130 min  Stress: No Stress Concern Present (02/22/2023)   [employer] of Occupational Health - Occupational Stress Questionnaire    Feeling of Stress : Not at all  Social Connections: Unknown (02/22/2023)   Social Connection and Isolation Panel [NHANES]    Frequency of Communication with Friends and Family:  Patient declined    Frequency of Social Gatherings with Friends and Family: Patient declined    Attends Religious Services: Patient declined    Database administrator or Organizations: No    Attends Engineer, structural: Not on file    Marital Status: Divorced    Review of Systems Per HPI  Objective:  BP 136/76   Pulse 60   Temp 98.8 F (37.1 C)   Ht 5\' 11"  (1.803 m)   Wt 173 lb (78.5 kg)   SpO2 98%   BMI 24.13 kg/m      06/25/2023    3:15 PM 06/04/2023    6:25 PM 02/26/2023    3:35 PM  BP/Weight  Systolic BP 136 132 127  Diastolic BP 76 82 86  Wt. (Lbs) 173  167  BMI 24.13 kg/m2  23.29 kg/m2    Physical Exam Vitals and nursing note reviewed.  Constitutional:      General: He is not in  acute distress.    Appearance: Normal appearance.  HENT:     Head: Normocephalic and atraumatic.     Mouth/Throat:     Pharynx: Oropharynx is clear.  Eyes:     General:        Right eye: No discharge.        Left eye: No discharge.     Conjunctiva/sclera: Conjunctivae normal.  Cardiovascular:     Rate and Rhythm: Normal rate and regular rhythm.  Pulmonary:     Effort: Pulmonary effort is normal.     Breath sounds: Normal breath sounds.  Abdominal:     General: There is no distension.     Palpations: Abdomen is soft.     Tenderness: There is no abdominal tenderness.  Neurological:     Mental Status: He is alert.     Lab Results  Component Value Date   WBC 5.5 06/25/2023   HGB 17.1 06/25/2023   HCT 52.7 (H) 06/25/2023   PLT 242 06/25/2023   GLUCOSE 95 06/25/2023   CHOL 163 06/23/2022   TRIG 53 06/23/2022   HDL 85 06/23/2022   LDLCALC 67 06/23/2022   ALT 167 (H) 06/25/2023   AST 34 06/25/2023   NA 140 06/25/2023   K 4.6 06/25/2023   CL 96 06/25/2023   CREATININE 1.19 06/25/2023   BUN 21 (H) 06/25/2023   CO2 25 06/25/2023   TSH 2.430 06/25/2023   INR 0.99 10/03/2010   HGBA1C 5.1 06/23/2022     Assessment & Plan:   Problem List Items Addressed This Visit       Cardiovascular and Mediastinum   Primary hypertension   Relevant Orders   CMP14+EGFR (Completed)     Other   Chronic fatigue - Primary    Patient presents with a myriad of symptoms.  Etiology unclear.  Normal exam.  Labs obtained for further evaluation today.       Relevant Orders   CBC (Completed)   TSH (Completed)   Vitamin B12 (Completed)   Vitamin D, 25-hydroxy (Completed)   Other Visit Diagnoses     Dry mouth       Relevant Orders   Sjogrens syndrome-A extractable nuclear antibody (Completed)   Sjogrens syndrome-B extractable nuclear antibody (Completed)   Possible exposure to STD       Relevant Orders   HIV antibody (with reflex) (Completed)   RPR (Completed)   GC/Chlamydia Probe  Amp([facility])   Chronic diarrhea       Relevant Orders  Sedimentation Rate (Completed)      Follow-up:  Pending workup   Alencia Gordon Adriana Simas DO [facility]

## 2023-06-29 ENCOUNTER — Encounter: Payer: Self-pay | Admitting: Family Medicine

## 2023-06-29 LAB — GC/CHLAMYDIA PROBE AMP
Chlamydia trachomatis, NAA: NEGATIVE
Neisseria Gonorrhoeae by PCR: NEGATIVE

## 2023-06-30 ENCOUNTER — Other Ambulatory Visit: Payer: Self-pay

## 2023-06-30 DIAGNOSIS — R7989 Other specified abnormal findings of blood chemistry: Secondary | ICD-10-CM

## 2023-07-01 NOTE — Progress Notes (Signed)
[facility] Summary Report / Loop Recorder 

## 2023-07-02 ENCOUNTER — Ambulatory Visit: Payer: 59

## 2023-07-09 ENCOUNTER — Ambulatory Visit: Payer: 59

## 2023-07-11 ENCOUNTER — Encounter: Payer: Self-pay | Admitting: Family Medicine

## 2023-07-13 ENCOUNTER — Other Ambulatory Visit: Payer: Self-pay | Admitting: Family Medicine

## 2023-07-13 DIAGNOSIS — R5382 Chronic fatigue, unspecified: Secondary | ICD-10-CM

## 2023-07-13 MED ORDER — NEBIVOLOL HCL 5 MG PO TABS: 5.0000 mg | ORAL_TABLET | Freq: Every day | ORAL | 3 refills | Status: DC

## 2023-07-20 ENCOUNTER — Ambulatory Visit: Payer: 59

## 2023-07-29 ENCOUNTER — Ambulatory Visit: Payer: 59 | Admitting: Internal Medicine

## 2023-08-01 ENCOUNTER — Other Ambulatory Visit: Payer: Self-pay | Admitting: Family Medicine

## 2023-08-03 ENCOUNTER — Ambulatory Visit: Payer: 59

## 2023-08-10 ENCOUNTER — Ambulatory Visit: Payer: 59

## 2023-08-14 ENCOUNTER — Encounter: Payer: Self-pay | Admitting: Family Medicine

## 2023-08-17 NOTE — Telephone Encounter (Signed)
Spoke with patient and informed per drs result notes and recommendations. He states his testicular pain has subsided and does not need a referral to urology at this time.

## 2023-08-17 NOTE — Telephone Encounter (Signed)
Logan Sams, DO     Given negative tests in July and September, I would not recommend additional testing unless he is having unprotected intercourse and having symptoms like dysuria or penile discharge. If he is having persistent testicle pain, recommend seeing Urology.

## 2023-08-20 ENCOUNTER — Ambulatory Visit (INDEPENDENT_AMBULATORY_CARE_PROVIDER_SITE_OTHER): Payer: 59

## 2023-08-20 ENCOUNTER — Ambulatory Visit: Payer: 59

## 2023-08-20 DIAGNOSIS — R55 Syncope and collapse: Secondary | ICD-10-CM | POA: Diagnosis not present

## 2023-09-04 ENCOUNTER — Ambulatory Visit: Payer: 59

## 2023-09-07 NOTE — Progress Notes (Signed)
[facility] Summary Report / Loop Recorder 

## 2023-09-10 ENCOUNTER — Ambulatory Visit: Payer: 59

## 2023-09-15 ENCOUNTER — Ambulatory Visit: Payer: 59 | Admitting: Nurse Practitioner

## 2023-09-16 ENCOUNTER — Ambulatory Visit (INDEPENDENT_AMBULATORY_CARE_PROVIDER_SITE_OTHER): Payer: 59 | Admitting: Internal Medicine

## 2023-09-16 ENCOUNTER — Encounter: Payer: Self-pay | Admitting: Internal Medicine

## 2023-09-16 VITALS — BP 135/83 | HR 49 | Temp 97.8°F | Ht 71.0 in | Wt 171.9 lb

## 2023-09-16 DIAGNOSIS — R7989 Other specified abnormal findings of blood chemistry: Secondary | ICD-10-CM

## 2023-09-16 NOTE — Patient Instructions (Signed)
I am going to recheck your liver test today.  If still elevated then I will order further blood work to evaluate.  It was nice seeing you again today.  I hope you have a great Christmas.  Dr. Marletta Lor

## 2023-09-17 ENCOUNTER — Telehealth: Payer: Self-pay | Admitting: Cardiovascular Disease

## 2023-09-17 NOTE — Telephone Encounter (Signed)
I offered an appointment today at 10 am in [place],patient unable to go  Apt made for Tuesday, 09/22/23 at @ 2:45 pm with C.Walker at [facility].

## 2023-09-17 NOTE — Telephone Encounter (Signed)
   Pt c/o of Chest Pain: STAT if active CP, including tightness, pressure, jaw pain, radiating pain to shoulder/upper arm/back, CP unrelieved by Nitro. Symptoms reported of SOB, nausea, vomiting, sweating.  1. Are you having CP right now? Yes , , jaw pain, but not at this time    2. Are you experiencing any other symptoms (ex. SOB, nausea, vomiting, sweating)? no   3. Is your CP continuous or coming and going? Comes and goes   4. Have you taken Nitroglycerin? no   5. How long have you been experiencing CP? Last 2 days    6. If NO CP at time of call then end call with telling Pt to call back or call 911 if Chest pain returns prior to return call from triage team.

## 2023-09-17 NOTE — Telephone Encounter (Signed)
Logan Stade, MD 05/15/2021  2:16 PM EDT     Calcium score 0 no CAD great     06/26/21  1:11 PM From a heart perspective I don't think we are missing anything You had a normal echo and normal ETT and your heart CT was totally normal with a calcium score of 0 no plaque and no blockages There is no need for further cardiac testing Can f/u with you primary if you  think your lungs or other issues need further testing       I spoke with patient and he exercises regularly, including lifting weights. He also says he sees a dentist regularly too. I encouraged him to speak with his pcp also. I will forward to Dr.[name] also.

## 2023-09-17 NOTE — Progress Notes (Signed)
Referring Provider: Tommie Sams, DO Primary Care Physician:  Tommie Sams, DO Primary GI:  Dr. Marletta Lor  Chief Complaint  Patient presents with   Elevated Hepatic Enzymes    Wanted to discuss elevated liver enzymes. Last labs for liver with Dr. Adriana Simas back in September. Does have some pain and pressure at times on left side and in back.     HPI:   Logan Benton is a 42 y.o. male who presents to clinic today to discuss abnormal LFTs.  Routine blood work 06/25/2023 with alk phos 134, ALT 167, AST 34, T. bili 0.4.  Looking further back, patient has had intermittently elevated LFTs in the past.  He denies any chronic alcohol use.  No illicit drug use.  No exposure to viral hepatitis that he is aware of.  Denies any itching.  No family history of liver disease.  Does note taking a few herbal supplements including oregano oil.  Denies any abdominal pain.  Past Medical History:  Diagnosis Date   Abscessed tooth    C. difficile diarrhea    presumptive diagnosis   Hypertension    Trigger finger     Past Surgical History:  Procedure Laterality Date   BILATERAL CARPAL TUNNEL RELEASE     BIOPSY  06/29/2020   Procedure: BIOPSY;  Surgeon: Lanelle Bal, DO;  Location: [facility];  Service: Endoscopy;;   BIOPSY  09/03/2020   Procedure: BIOPSY;  Surgeon: Lanelle Bal, DO;  [facility]: [facility];  Service: Endoscopy;;   BIOPSY  03/08/2021   Procedure: BIOPSY;  Surgeon: Lanelle Bal, DO;  [facility]: [facility];  Service: Endoscopy;;   BIOPSY  05/10/2021   Procedure: BIOPSY;  Surgeon: Lanelle Bal, DO;  [facility]: [facility];  Service: Endoscopy;;   CHOLECYSTECTOMY N/A 04/25/2022   Procedure: LAPAROSCOPIC CHOLECYSTECTOMY;  Surgeon: Lucretia Roers, MD;  Location: [facility];  Service: General;  Laterality: N/A;   COLONOSCOPY WITH PROPOFOL N/A 06/29/2020   Dr. Marletta Lor: 4 mm tubular adenoma removed, nonbleeding internal hemorrhoids.  Repeat colonoscopy in 7 years.    COLONOSCOPY WITH PROPOFOL N/A 05/10/2021   Procedure: COLONOSCOPY WITH PROPOFOL;  Surgeon: Lanelle Bal, DO;  [facility]: [facility];  Service: Endoscopy;  Laterality: N/A;  1:15pm   ESOPHAGOGASTRODUODENOSCOPY (EGD) WITH PROPOFOL N/A 06/29/2020   Dr. Marletta Lor: LA grade C esophagitis, gastritis with mild chronic gastritis on path, small bowel biopsy unremarkable.   ESOPHAGOGASTRODUODENOSCOPY (EGD) WITH PROPOFOL N/A 09/03/2020   Dr. Marletta Lor: Esophageal mucosal changes suggestive of short segment Barrett's esophagus, biopsies consistent.  Next EGD in 3 years   ESOPHAGOGASTRODUODENOSCOPY (EGD) WITH PROPOFOL N/A 03/08/2021   Procedure: ESOPHAGOGASTRODUODENOSCOPY (EGD) WITH PROPOFOL;  Surgeon: Lanelle Bal, DO;  [facility];  Service: Endoscopy;  Laterality: N/A;  1:00pm   LOOP RECORDER INSERTION N/A 03/26/2022   Procedure: LOOP RECORDER INSERTION;  Surgeon: Regan Lemming, MD;  [facility]: [facility];  Service: Cardiovascular;  Laterality: N/A;   MASS EXCISION Right 04/25/2022   Procedure: EXCISION MASS UPPER RIGHT ABDOMEN;  Surgeon: Lucretia Roers, MD;  Location: [facility];  Service: General;  Laterality: Right;   POLYPECTOMY  06/29/2020   Procedure: POLYPECTOMY;  Surgeon: Lanelle Bal, DO;  [facility];  Service: Endoscopy;;   SHOULDER SURGERY Right    x2   TRIGGER FINGER RELEASE Right 2016    Current Outpatient Medications  Medication Sig Dispense Refill   nebivolol (BYSTOLIC) 5  MG tablet Take 1 tablet (5 mg total) by mouth daily. 90 tablet 3   No current facility-administered medications for this visit.    Allergies as of 09/16/2023 - Review Complete 09/16/2023  Allergen Reaction Noted   Nsaids Diarrhea and Other (See Comments) 02/26/2023    Family History  Problem Relation Age of Onset   Rheum arthritis Mother    Bladder Cancer Mother    Kidney disease Father    Bladder Cancer Maternal Grandfather    Irritable bowel syndrome Paternal  Grandmother    Colon cancer Neg Hx    Celiac disease Neg Hx    Inflammatory bowel disease Neg Hx     Social History   Socioeconomic History   Marital status: Divorced    Spouse name: Not on file   Number of children: Not on file   Years of education: Not on file   Highest education level: 12th grade  Occupational History   Not on file  Tobacco Use   Smoking status: Former    Current packs/day: 0.00    Types: Cigarettes    Quit date: 11/03/2019    Years since quitting: 3.8    Passive exposure: Past   Smokeless tobacco: Never   Tobacco comments:    nicoten gum  Vaping Use   Vaping status: Former   Quit date: 03/07/2019  Substance and Sexual Activity   Alcohol use: No   Drug use: Yes    Types: Marijuana   Sexual activity: Not Currently    Partners: Female  Other Topics Concern   Not on file  Social History Narrative   Not on file   Social Drivers of Health   Financial Resource Strain: Low Risk  (02/22/2023)   Overall Financial Resource Strain (CARDIA)    Difficulty of Paying Living Expenses: Not very hard  Food Insecurity: No Food Insecurity (02/22/2023)   Hunger Vital Sign    Worried About Running Out of Food in the Last Year: Never true    Ran Out of Food in the Last Year: Never true  Transportation Needs: No Transportation Needs (02/22/2023)   PRAPARE - Administrator, Civil Service (Medical): No    Lack of Transportation (Non-Medical): No  Physical Activity: Sufficiently Active (02/22/2023)   Exercise Vital Sign    Days of Exercise per Week: 3 days    Minutes of Exercise per Session: 130 min  Stress: No Stress Concern Present (02/22/2023)   [employer] of Occupational Health - Occupational Stress Questionnaire    Feeling of Stress : Not at all  Social Connections: Unknown (02/22/2023)   Social Connection and Isolation Panel [NHANES]    Frequency of Communication with Friends and Family: Patient declined    Frequency of Social Gatherings with  Friends and Family: Patient declined    Attends Religious Services: Patient declined    Database administrator or Organizations: No    Attends Engineer, structural: Not on file    Marital Status: Divorced    Subjective: Review of Systems  Constitutional:  Negative for chills and fever.  HENT:  Negative for congestion and hearing loss.   Eyes:  Negative for blurred vision and double vision.  Respiratory:  Negative for cough and shortness of breath.   Cardiovascular:  Negative for chest pain and palpitations.  Gastrointestinal:  Negative for abdominal pain, blood in stool, constipation, diarrhea, heartburn, melena and vomiting.  Genitourinary:  Negative for dysuria and urgency.  Musculoskeletal:  Negative for joint  pain and myalgias.  Skin:  Negative for itching and rash.  Neurological:  Negative for dizziness and headaches.  Psychiatric/Behavioral:  Negative for depression. The patient is not nervous/anxious.      Objective: BP 135/83 (BP Location: Left Arm, Patient Position: Sitting, Cuff Size: Normal)   Pulse (!) 49   Temp 97.8 F (36.6 C) (Oral)   Ht 5\' 11"  (1.803 m)   Wt 171 lb 14.4 oz (78 kg)   BMI 23.98 kg/m  Physical Exam Constitutional:      Appearance: Normal appearance.  HENT:     Head: Normocephalic and atraumatic.  Eyes:     Extraocular Movements: Extraocular movements intact.     Conjunctiva/sclera: Conjunctivae normal.  Cardiovascular:     Rate and Rhythm: Normal rate and regular rhythm.  Pulmonary:     Effort: Pulmonary effort is normal.     Breath sounds: Normal breath sounds.  Abdominal:     General: Bowel sounds are normal.     Palpations: Abdomen is soft.  Musculoskeletal:        General: Normal range of motion.     Cervical back: Normal range of motion and neck supple.  Skin:    General: Skin is warm.  Neurological:     General: No focal deficit present.     Mental Status: He is alert and oriented to person, place, and time.   Psychiatric:        Mood and Affect: Mood normal.        Behavior: Behavior normal.      Assessment/Plan:  Abnormal LFTs, will recheck today.  If still elevated will perform full serological workup to further evaluate.  May need imaging as well though we will wait initial blood test first.  May need liver biopsy pending clinical course, discussed this with him today.    09/17/2023 5:18 PM   Disclaimer: This note was dictated with voice recognition software. Similar sounding words can inadvertently be transcribed and may not be corrected upon review.

## 2023-09-18 DIAGNOSIS — R7989 Other specified abnormal findings of blood chemistry: Secondary | ICD-10-CM | POA: Diagnosis not present

## 2023-09-19 LAB — HEPATIC FUNCTION PANEL
ALT: 23 [IU]/L (ref 0–44)
AST: 24 [IU]/L (ref 0–40)
Albumin: 4.6 g/dL (ref 4.1–5.1)
Alkaline Phosphatase: 56 [IU]/L (ref 44–121)
Bilirubin Total: 0.4 mg/dL (ref 0.0–1.2)
Bilirubin, Direct: 0.19 mg/dL (ref 0.00–0.40)
Total Protein: 6.7 g/dL (ref 6.0–8.5)

## 2023-09-21 ENCOUNTER — Ambulatory Visit: Payer: 59

## 2023-09-21 ENCOUNTER — Encounter (HOSPITAL_BASED_OUTPATIENT_CLINIC_OR_DEPARTMENT_OTHER): Payer: Self-pay

## 2023-09-22 ENCOUNTER — Encounter (HOSPITAL_BASED_OUTPATIENT_CLINIC_OR_DEPARTMENT_OTHER): Payer: Self-pay | Admitting: Family

## 2023-09-22 ENCOUNTER — Encounter: Payer: Self-pay | Admitting: Family Medicine

## 2023-09-22 ENCOUNTER — Ambulatory Visit (INDEPENDENT_AMBULATORY_CARE_PROVIDER_SITE_OTHER): Payer: 59 | Admitting: Family

## 2023-09-22 VITALS — BP 124/80 | HR 46 | Ht 64.0 in | Wt 174.0 lb

## 2023-09-22 DIAGNOSIS — Z87898 Personal history of other specified conditions: Secondary | ICD-10-CM

## 2023-09-22 DIAGNOSIS — R0789 Other chest pain: Secondary | ICD-10-CM

## 2023-09-22 DIAGNOSIS — R001 Bradycardia, unspecified: Secondary | ICD-10-CM

## 2023-09-22 NOTE — Patient Instructions (Addendum)
Medication Instructions:  Your physician recommends that you continue on your current medications as directed. Please refer to the Current Medication list given to you today.  *If you need a refill on your cardiac medications before your next appointment, please call your pharmacy*  Lab Work: NONE  Testing/Procedures: NONE  Follow-Up: At [facility], you and your health needs are our priority.  As part of our continuing mission to provide you with exceptional heart care, we have created designated Provider Care Teams.  These Care Teams include your primary Cardiologist (physician) and Advanced Practice Providers (APPs -  Physician Assistants and Nurse Practitioners) who all work together to provide you with the care you need, when you need it.  We recommend signing up for the patient portal called "MyChart".  Sign up information is provided on this After Visit Summary.  MyChart is used to connect with patients for Virtual Visits (Telemedicine).  Patients are able to view lab/test results, encounter notes, upcoming appointments, etc.  Non-urgent messages can be sent to your provider as well.   To learn more about what you can do with MyChart, go to [email or web address].    Your next appointment:   12 month(s)  The format for your next appointment:   In Person  Provider:   Dr. [place] Emms

## 2023-09-22 NOTE — Progress Notes (Signed)
Cardiology Office Note:  .   Date:  09/22/2023  ID:  Logan Benton, DOB April 14, 1981, MRN [id number] PCP: Tommie Sams, DO  [facility] Providers Cardiologist:  Charlton Haws, MD Electrophysiologist:  Will Jorja Loa, MD    History of Present Illness: .   Logan Benton is a 42 y.o. 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  Prior evaluation with Dr. Eden Emms for atypical chest pain. Prior coronary CTA 05/2021 calcium score of 0.   Seen 04/2021 with reassurance provided regarding unremarkable workup and chest pain felt to be related to GI issues.   Admitted 6/18-6/21/23 with chest pain and near syncope. Near syncope event and EKG initially concerning for Brugada syndrome but despite ETT could not replicate symptoms. Echo 03/2022 normal LVEF 60 to 65%, no RWMA, RV normal, no significant valvular abnormalities.  ETT 03/2022 normal with no ischemia. ILR was placed for further evaluation. Lisinopril stopped and Metoprolol initiated.   07/2023 his PCP transitioned Metoprolol to Nebivolol per Mr. Fichter request due to preferred side effect profile.  He contacted the office yesterday noting intermittent episodes of chest pain and jaw pain and was scheduled for follow-up.  Discussed the use of AI scribe software for clinical note transcription with the patient, who gave verbal consent to proceed.    Presents with intermittent chest discomfort described as a 'really bad tightness' and 'squeezing' sensation in the left chest. The discomfort is associated with a 'weird sensation' of 'pulling or pushing out' and occasional jaw pain. It radiates around the left flank. The symptoms occur both at rest or during exercise. Prior fatigue overall improved since transition from Metoprolol to Nebivolol.  The patient has been on nebivolol for hypertension for approximately two to three  months, having switched from metoprolol due to its more favorable side effect profile. He reports occasional episodes of heart 'racing or fluttering' or 'weird beats' occurring once or twice a week. These episodes are short and self-resolve. He denies any recurrent syncope. No lightheadedness, dizziness, near syncope, syncope. Not monitoring HR nor BP routinely at home. ILR readings have been unremarkable.  The patient maintains an active lifestyle, exercising with calisthenics. He also reports a healthy diet, mostly consisting of whole foods and organic products, and avoids processed foods. He works in [employer], with the nature of his work varying between sedentary and active depending on the day.     ROS: Please see the history of present illness.    All other systems reviewed and are negative.   Studies Reviewed: Marland Kitchen   EKG Interpretation Date/Time:  Tuesday September 22 2023 14:27:20 EST Ventricular Rate:  46 PR Interval:  134 QRS Duration:  110 QT Interval:  410 QTC Calculation: 358 R Axis:   49  Text Interpretation: Sinus bradycardia Incomplete right bundle branch block  Stable compared to previous. Confirmed by Gillian Shields ([id number]) on 09/22/2023 2:37:39 PM    Cardiac Studies & Procedures     STRESS TESTS  EXERCISE TOLERANCE TEST (ETT) 03/26/2022  Narrative   Excellent exercise capacity, achieved 17.2 METS   Peak heart rate 176 bpm (96% max age predicted HR)   No ST deviation was noted.   No evidence of ischemia  ECHOCARDIOGRAM  ECHOCARDIOGRAM COMPLETE 03/24/2022  Narrative ECHOCARDIOGRAM REPORT    Patient Name:   Logan Benton Date of Exam: 03/24/2022 Medical Rec #:  [id number]        Height:  71.0 in Accession #:    [id number]       Weight:       149.9 lb Date of Birth:  23-Jan-1981       BSA:          1.865 m Patient Age:    40 years         BP:           113/68 mmHg Patient Gender: M                HR:           50 bpm. [facility]  Procedure: 2D Echo, Cardiac Doppler and Color Doppler  Indications:    Chest Pain  History:        Patient has no prior history of Echocardiogram examinations. Signs/Symptoms:Chest Pain; Risk Factors:Hypertension.  Sonographer:    Mikki Harbor Referring Phys: [id number] OLADAPO ADEFESO  IMPRESSIONS   1. Left ventricular ejection fraction, by estimation, is 60 to 65%. The left ventricle has normal function. The left ventricle has no regional wall motion abnormalities. Left ventricular diastolic parameters were normal. 2. Right ventricular systolic function is normal. The right ventricular size is normal. There is normal pulmonary artery systolic pressure. The estimated right ventricular systolic pressure is 25.7 mmHg. 3. The mitral valve is normal in structure. Trivial mitral valve regurgitation. 4. The aortic valve is tricuspid. Aortic valve regurgitation is not visualized. No aortic stenosis is present. 5. The inferior vena cava is normal in size with greater than 50% respiratory variability, suggesting right atrial pressure of 3 mmHg.  Comparison(s): No prior Echocardiogram.  FINDINGS Left Ventricle: Left ventricular ejection fraction, by estimation, is 60 to 65%. The left ventricle has normal function. The left ventricle has no regional wall motion abnormalities. The left ventricular internal cavity size was normal in size. There is no left ventricular hypertrophy. Left ventricular diastolic parameters were normal.  Right Ventricle: The right ventricular size is normal. No increase in right ventricular wall thickness. Right ventricular systolic function is normal. There is normal pulmonary artery systolic pressure. The tricuspid regurgitant velocity is 2.38 m/s, and with an assumed right atrial pressure of 3 mmHg, the estimated right ventricular systolic pressure is 25.7 mmHg.  Left Atrium: Left atrial size was normal in size.  Right Atrium: Right atrial size was normal in  size.  Pericardium: There is no evidence of pericardial effusion.  Mitral Valve: The mitral valve is normal in structure. Trivial mitral valve regurgitation. MV peak gradient, 3.2 mmHg. The mean mitral valve gradient is 1.0 mmHg.  Tricuspid Valve: The tricuspid valve is normal in structure. Tricuspid valve regurgitation is trivial.  Aortic Valve: The aortic valve is tricuspid. Aortic valve regurgitation is not visualized. No aortic stenosis is present. Aortic valve mean gradient measures 2.0 mmHg. Aortic valve peak gradient measures 4.2 mmHg. Aortic valve area, by VTI measures 2.99 cm.  Pulmonic Valve: The pulmonic valve was normal in structure. Pulmonic valve regurgitation is trivial.  Aorta: The aortic root is normal in size and structure.  Venous: The inferior vena cava is normal in size with greater than 50% respiratory variability, suggesting right atrial pressure of 3 mmHg.  IAS/Shunts: The atrial septum is grossly normal.   LEFT VENTRICLE PLAX 2D LVIDd:         5.00 cm      Diastology LVIDs:         2.80 cm      LV e' medial:  12.60 cm/s LV PW:         1.00 cm      LV E/e' medial:  5.8 LV IVS:        1.20 cm      LV e' lateral:   20.00 cm/s LVOT diam:     2.10 cm      LV E/e' lateral: 3.7 LV SV:         74 LV SV Index:   40 LVOT Area:     3.46 cm  LV Volumes (MOD) LV vol d, MOD A2C: 125.0 ml LV vol d, MOD A4C: 96.6 ml LV vol s, MOD A2C: 41.6 ml LV vol s, MOD A4C: 29.5 ml LV SV MOD A2C:     83.4 ml LV SV MOD A4C:     96.6 ml LV SV MOD BP:      78.7 ml  RIGHT VENTRICLE RV Basal diam:  3.80 cm RV Mid diam:    3.10 cm RV S prime:     12.40 cm/s TAPSE (M-mode): 2.4 cm  LEFT ATRIUM             Index        RIGHT ATRIUM           Index LA diam:        3.90 cm 2.09 cm/m   RA Area:     17.40 cm LA Vol (A2C):   76.1 ml 40.79 ml/m  RA Volume:   49.80 ml  26.70 ml/m LA Vol (A4C):   38.1 ml 20.42 ml/m LA Biplane Vol: 56.5 ml 30.29 ml/m AORTIC VALVE                     PULMONIC VALVE AV Area (Vmax):    3.23 cm     PV Vmax:       0.83 m/s AV Area (Vmean):   2.96 cm     PV Peak grad:  2.7 mmHg AV Area (VTI):     2.99 cm AV Vmax:           102.00 cm/s AV Vmean:          69.700 cm/s AV VTI:            0.247 m AV Peak Grad:      4.2 mmHg AV Mean Grad:      2.0 mmHg LVOT Vmax:         95.00 cm/s LVOT Vmean:        59.600 cm/s LVOT VTI:          0.213 m LVOT/AV VTI ratio: 0.86  AORTA Ao Root diam: 3.40 cm  MITRAL VALVE               TRICUSPID VALVE MV Area (PHT): 2.80 cm    TR Peak grad:   22.7 mmHg MV Area VTI:   2.19 cm    TR Vmax:        238.00 cm/s MV Peak grad:  3.2 mmHg MV Mean grad:  1.0 mmHg    SHUNTS MV Vmax:       0.90 m/s    Systemic VTI:  0.21 m MV Vmean:      39.0 cm/s   Systemic Diam: 2.10 cm MV Decel Time: 271 msec MV E velocity: 73.70 cm/s MV A velocity: 30.80 cm/s MV E/A ratio:  2.39  Laurance Flatten MD Electronically signed by Laurance Flatten MD Signature Date/Time: 03/24/2022/11:35:41 AM    Final  MONITORS  CARDIAC EVENT MONITOR 12/20/2019  Narrative  Sinus rhythm, sinus arrhythmia, sinus bradycardia, and sinus tachycardia.  Symptoms corresponded with all of the above.  CT SCANS  CT CORONARY MORPH W/CTA COR W/SCORE 05/15/2021  Addendum 05/15/2021 11:51 AM ADDENDUM REPORT: 05/15/2021 11:48  CLINICAL DATA:  Chest pain  EXAM: Cardiac CTA  MEDICATIONS: Sub lingual nitro. 4 mg and lopressor 100mg   TECHNIQUE: The patient was scanned on a [employer] 192 scanner. Gantry rotation speed was 250 msecs. Collimation was. 6 mm . A 100 Kv Flash scan was performed with a single phase  Average HR during the scan was 51 bpm. The 3D data set was interpreted on a dedicated work station using MPR, MIP and VRT modes. A total of 80 cc of contrast was used.  FINDINGS: Non-cardiac: See separate report from [facility]. No significant findings on limited lung and soft tissue windows.  Calcium score:  0  Coronary Arteries: Right dominant with no anomalies  LM: Normal  LAD: Normal  D1: Normal  D2: Normal  Circumflex: Normal  OM1: Normal  OM2: Normal  OM3: Normal  RCA: Normal  PDA: Normal  PLA: Normal  IMPRESSION: 1. Calcium score 0  2.  CAD RADS 0 normal right dominant coronary arteries  3.  Normal aortic root 2.8 cm  Charlton Haws   Electronically Signed By: Charlton Haws M.D. On: 05/15/2021 11:48  Narrative EXAM: OVER-READ INTERPRETATION  CT CHEST  The following report is an over-read performed by radiologist Dr. Leanna Battles of [facility] on 05/15/2021. This over-read does not include interpretation of cardiac or coronary anatomy or pathology. The coronary calcium score/coronary CTA interpretation by the cardiologist is attached.  COMPARISON:  06/04/2019.  FINDINGS: Vascular: Heart is at the upper limits of normal in size. No pericardial effusion.  Mediastinum/Nodes: None.  Lungs/Pleura: None.  Upper Abdomen: None.  Musculoskeletal: Degenerative changes in the spine.  IMPRESSION: No acute extracardiac findings.  Electronically Signed: By: Leanna Battles M.D. On: 05/15/2021 10:38          Risk Assessment/Calculations:             Physical Exam:   VS:  BP 124/80 (BP Location: Left Arm, Patient Position: Sitting, Cuff Size: Normal)   Pulse (!) 46   Ht 5\' 4"  (1.626 m)   Wt 174 lb (78.9 kg)   SpO2 97%   BMI 29.87 kg/m    Wt Readings from Last 3 Encounters:  09/22/23 174 lb (78.9 kg)  09/16/23 171 lb 14.4 oz (78 kg)  06/25/23 173 lb (78.5 kg)    GEN: Well nourished, well developed in no acute distress NECK: No JVD; No carotid bruits CARDIAC: bradycardic, RRR, no murmurs, rubs, gallops RESPIRATORY:  Clear to auscultation without rales, wheezing or rhonchi  ABDOMEN: Soft, non-tender, non-distended EXTREMITIES:  No edema; No deformity   ASSESSMENT AND PLAN: .       Chest Pain Intermittent chest tightness  sometimes associated jaw pain at rest or activity. No aggravating nor relieving factors. Continues to exercise multiple times per week doing calisthenics without difficulty. EKG normal with SB 46 bpm with stable iRBBB but no acute ST/T wave changes. No change in exercise tolerance. -Chest pain overall atypical for angina. No indication for ischemic evaluation.  -Contact office if chest pain increases, particularly during exertion or exercise.  Sinus Bradycardia / Hx of Syncope Admission 03/2022 with syncope with initial EKG concerning for Brugada. ETT 03/2022 no ischemia and unable to replicate symptoms.  ILR in place. Heart rate on the lower end, potentially due to Nebivolol. Some reported fatigue which is overall improved since transition from Metoprolol to Nebivolol by PCP. No recurrent syncope. -Continue Nebivolol. -Will reach out to Dr. Elberta Fortis (electrophysiologist) to review case and consider possible dose reduction given bradycardia.  Hypertension Well-controlled on Nebivolol. -Continue Nebivolol. Will discuss bradycardia likely related to Nebivolol with EP, as above. If beta blocker has to be discontinued, consider Amlodipine as alternate agent.             Dispo: follow up in 1 year with Dr. Eden Emms or APP  Signed, Alver Sorrow, NP

## 2023-09-23 ENCOUNTER — Ambulatory Visit: Payer: Self-pay | Admitting: Family Medicine

## 2023-09-23 ENCOUNTER — Other Ambulatory Visit: Payer: Self-pay | Admitting: Family Medicine

## 2023-09-23 ENCOUNTER — Ambulatory Visit (INDEPENDENT_AMBULATORY_CARE_PROVIDER_SITE_OTHER): Payer: 59 | Admitting: Family Medicine

## 2023-09-23 VITALS — BP 119/71 | HR 54 | Temp 98.4°F | Ht 64.0 in | Wt 168.2 lb

## 2023-09-23 DIAGNOSIS — S61431A Puncture wound without foreign body of right hand, initial encounter: Secondary | ICD-10-CM

## 2023-09-23 DIAGNOSIS — T148XXA Other injury of unspecified body region, initial encounter: Secondary | ICD-10-CM

## 2023-09-23 DIAGNOSIS — L03119 Cellulitis of unspecified part of limb: Secondary | ICD-10-CM | POA: Diagnosis not present

## 2023-09-23 DIAGNOSIS — Z23 Encounter for immunization: Secondary | ICD-10-CM | POA: Diagnosis not present

## 2023-09-23 MED ORDER — AMOXICILLIN-POT CLAVULANATE 875-125 MG PO TABS: 1.0000 | ORAL_TABLET | Freq: Two times a day (BID) | ORAL | 0 refills | Status: DC

## 2023-09-23 NOTE — Progress Notes (Addendum)
° °  Subjective:    Patient ID: Logan Benton, male    DOB: 07/01/1981, 42 y.o.   MRN: [id number]  Discussed the use of AI scribe software for clinical note transcription with the patient, who gave verbal consent to proceed.  History of Present Illness   Edwards, a patient with no significant past medical history, presents with a cat bite on his hand that occurred approximately 20 hours prior to the consultation. The bite was inflicted by his own indoor cat, which is up-to-date on all vaccinations, including rabies. The incident occurred when the cat was startled by a stray dog and climbed a tree. While attempting to retrieve the cat from the tree, Harrison was bitten on the hand.  The bite, inflicted by the cat's canines, is described as a puncture wound. The patient reports significant pain and tenderness around the bite site, with limited flexion of the affected hand. He also notes that the area around the bite is becoming increasingly red and swollen.  Hence has already started on a course of Augmentin, prescribed by Dr. Adriana Simas, which he picked up from the pharmacy earlier in the day. He is also interested in receiving a tetanus booster, as he is unsure of when he last received one. He has no known allergies to medications and has tolerated Augmentin well in the past.  The patient is aware of the potential severity of cat bites and is keen to avoid a trip to the ER. He is committed to taking the antibiotics as prescribed and monitoring the wound closely for signs of worsening infection.     Patient relates that he was bit on the right hand     Review of Systems     Objective:    Physical Exam   SKIN: Lateral aspect of hand tender to palpation with erythema. Two puncture wounds on the finger from cat bite, indicative of top and bottom canines meeting.     Right hand cellulitis left arm looks good      Assessment & Plan:  Assessment and Plan   Right hand cellulitis due to a cat  bite Cat Bite Recent cat bite to the hand with localized redness and swelling. Pain and limited flexion of the affected finger. The cat is up-to-date on vaccinations. The risk of infection, particularly from Pasteurella multocida, was discussed. -Start Augmentin 875/125mg  twice daily for 7 days. -Apply warm compresses several times a day. -Monitor for worsening redness, swelling, or pain and seek emergency care if these symptoms occur.  Tetanus Prophylaxis Uncertain when last tetanus shot was administered. -Administer tetanus booster today.   Please  1. Animal bite (Primary) Please see discussion above I did encourage him to do serial pictures as well as utilizing a ink pen to help monitor the size of this area over the course the next 48 to 72 hours we should see improvement otherwise he would need to go to the ER - Td : Tetanus/diphtheria >7yo Preservative  free

## 2023-09-23 NOTE — Telephone Encounter (Signed)
Copied from CRM [id number]. Topic: Clinical - Red Word Triage >> Sep 23, 2023  8:28 AM Ivette P wrote: Red Word that prompted transfer to Nurse Triage: Animal Bite  Chief Complaint: cat bite Symptoms: right hand and index finger Frequency: was bite yesterday Pertinent Negatives: Patient denies fever and pain Disposition: [] ED /[] Urgent Care (no appt availability in office) / [x] Appointment(In office/virtual)/ []  [facility]/ [] Home Care/ [] Refused Recommended Disposition /[]  Mobile Bus/ []  Follow-up with PCP Additional Notes: Patient reports being bitten by his cat yesterday and today finger is swollen and "looks infected".  Reports attempting to get cat out of a tree and was bite.  States cat has had rabies vaccine but patient cannot remember the last time he had a tetanus shot.  Apt made for this afternoon; instructed to go to the er if becomes worse.  Reason for Disposition  [1] Last tetanus shot > 5 years ago AND [2] any wound (e.g., cut, scrape)  Answer Assessment - Initial Assessment Questions 1. ANIMAL: "What type of animal caused the bite?" "Is the injury from a bite or a claw?" If the animal is a dog or a cat, ask: "Was it a pet or a stray?" "Was it acting ill or behaving strangely?"     My cat 2. LOCATION: "Where is the bite located?"      Right index finger and hand 3. SIZE: "How big is the bite?" "What does it look like?"      Two small punctures 4. ONSET: "When did the bite happen?" (Minutes or hours ago)      yesterday 5. CIRCUMSTANCES: "Tell me how this happened."      Trying to get cat out of tree 6. TETANUS: "When was your last tetanus booster?"     Does not remember 7. RABIES VACCINE: For dog or cat bites, ask: "Do you know if the pet is vaccinated against rabies?"  (e.g., yes, no, overdue for rabies shot, unknown)     Yes.  Protocols used: Animal Bite-A-AH

## 2023-09-23 NOTE — Telephone Encounter (Signed)
noted 

## 2023-09-29 ENCOUNTER — Encounter (HOSPITAL_BASED_OUTPATIENT_CLINIC_OR_DEPARTMENT_OTHER): Payer: Self-pay

## 2023-09-29 ENCOUNTER — Other Ambulatory Visit (HOSPITAL_BASED_OUTPATIENT_CLINIC_OR_DEPARTMENT_OTHER): Payer: Self-pay

## 2023-09-29 MED ORDER — NEBIVOLOL HCL 2.5 MG PO TABS: 2.5000 mg | ORAL_TABLET | Freq: Every day | ORAL | 3 refills | Status: DC

## 2023-09-29 NOTE — Progress Notes (Signed)
New rx sent to preferred pharmacy. NP in agreement with pt's request of lowering Nebivolol.

## 2023-10-02 ENCOUNTER — Telehealth: Payer: Self-pay | Admitting: Cardiology

## 2023-10-02 ENCOUNTER — Encounter (HOSPITAL_BASED_OUTPATIENT_CLINIC_OR_DEPARTMENT_OTHER): Payer: Self-pay

## 2023-10-02 ENCOUNTER — Ambulatory Visit (INDEPENDENT_AMBULATORY_CARE_PROVIDER_SITE_OTHER): Payer: 59

## 2023-10-02 DIAGNOSIS — R001 Bradycardia, unspecified: Secondary | ICD-10-CM

## 2023-10-02 LAB — CUP PACEART REMOTE DEVICE CHECK
Date Time Interrogation Session: 20241227071700
Implantable Pulse Generator Implant Date: 20230621
Pulse Gen Serial Number: 177683

## 2023-10-02 NOTE — Telephone Encounter (Signed)
Returned call to Pt.  Per Pt he has felt some palpitations this AM that come and go.  He has a little shortness of breath and a little light headed with these episode.  He states he normally has palpitations, but these felt stronger than normal and wanted to be sure his heart was ok.  Of note-Pt recently decreased his nebivolol to 2.5 mg daily.  Advised transmissions reviewed.  It does appear Pt is having some ectopy.  Also advised that it appeared his heart rate was quite variable.  Pt states he was able to feel that also.  Discussed role of beta blocker in suppressing ectopy, and subsequent effect on heart rate.  Advised Pt's heart rate already on the low side.  Pt thanked nurse for call back.  All questions were answered to Pt's satisfaction.  He will continue to monitor for symptomatic ectopy and call with any further questions.

## 2023-10-02 NOTE — Telephone Encounter (Signed)
Patient c/o Palpitations:  STAT if patient reporting lightheadedness, shortness of breath, or chest pain  How long have you had palpitations/irregular HR/ Afib? Are you having the symptoms now? Palpitations, since 5 am   Are you currently experiencing lightheadedness, SOB or CP? No   Do you have a history of afib (atrial fibrillation) or irregular heart rhythm? No   Have you checked your BP or HR? (document readings if available): no he did not take   Are you experiencing any other symptoms? Pt states he has been having some fluttering since 5 am. He states also felt chest tightness but not pain and some SOB. Pt states he sent a manual device check prior to calling and wants to know what it showed. Please advise.

## 2023-10-02 NOTE — Telephone Encounter (Signed)
Ancil Boozer RN spoke with patient

## 2023-10-05 ENCOUNTER — Encounter (HOSPITAL_BASED_OUTPATIENT_CLINIC_OR_DEPARTMENT_OTHER): Payer: Self-pay

## 2023-10-05 ENCOUNTER — Ambulatory Visit: Payer: 59

## 2023-10-05 MED ORDER — NEBIVOLOL HCL 2.5 MG PO TABS: 3.7500 mg | ORAL_TABLET | Freq: Every day | ORAL | 3 refills | Status: DC

## 2023-10-08 ENCOUNTER — Emergency Department (HOSPITAL_COMMUNITY)
Admission: EM | Admit: 2023-10-08 | Discharge: 2023-10-08 | Disposition: A | Discharge status: Home / Self Care | Payer: 59 | Attending: Emergency Medicine | Admitting: Emergency Medicine

## 2023-10-08 ENCOUNTER — Encounter (HOSPITAL_COMMUNITY): Payer: Self-pay

## 2023-10-08 ENCOUNTER — Other Ambulatory Visit: Payer: Self-pay

## 2023-10-08 ENCOUNTER — Emergency Department (HOSPITAL_COMMUNITY): Payer: 59

## 2023-10-08 DIAGNOSIS — R0789 Other chest pain: Secondary | ICD-10-CM | POA: Diagnosis not present

## 2023-10-08 DIAGNOSIS — R42 Dizziness and giddiness: Secondary | ICD-10-CM | POA: Diagnosis not present

## 2023-10-08 DIAGNOSIS — R079 Chest pain, unspecified: Secondary | ICD-10-CM | POA: Diagnosis not present

## 2023-10-08 DIAGNOSIS — Z87891 Personal history of nicotine dependence: Secondary | ICD-10-CM | POA: Insufficient documentation

## 2023-10-08 DIAGNOSIS — I10 Essential (primary) hypertension: Secondary | ICD-10-CM | POA: Diagnosis not present

## 2023-10-08 DIAGNOSIS — Z743 Need for continuous supervision: Secondary | ICD-10-CM | POA: Diagnosis not present

## 2023-10-08 DIAGNOSIS — R0689 Other abnormalities of breathing: Secondary | ICD-10-CM | POA: Diagnosis not present

## 2023-10-08 DIAGNOSIS — I499 Cardiac arrhythmia, unspecified: Secondary | ICD-10-CM | POA: Diagnosis not present

## 2023-10-08 LAB — CBC WITH DIFFERENTIAL/PLATELET
Abs Immature Granulocytes: 0.01 10*3/uL (ref 0.00–0.07)
Basophils Absolute: 0 10*3/uL (ref 0.0–0.1)
Basophils Relative: 1 %
Eosinophils Absolute: 0.1 10*3/uL (ref 0.0–0.5)
Eosinophils Relative: 2 %
HCT: 44 % (ref 39.0–52.0)
Hemoglobin: 15.1 g/dL (ref 13.0–17.0)
Immature Granulocytes: 0 %
Lymphocytes Relative: 33 %
Lymphs Abs: 2 10*3/uL (ref 0.7–4.0)
MCH: 31.5 pg (ref 26.0–34.0)
MCHC: 34.3 g/dL (ref 30.0–36.0)
MCV: 91.9 fL (ref 80.0–100.0)
Monocytes Absolute: 0.6 10*3/uL (ref 0.1–1.0)
Monocytes Relative: 10 %
Neutro Abs: 3.3 10*3/uL (ref 1.7–7.7)
Neutrophils Relative %: 54 %
Platelets: 240 10*3/uL (ref 150–400)
RBC: 4.79 MIL/uL (ref 4.22–5.81)
RDW: 12.5 % (ref 11.5–15.5)
WBC: 6.1 10*3/uL (ref 4.0–10.5)
nRBC: 0 % (ref 0.0–0.2)

## 2023-10-08 LAB — BASIC METABOLIC PANEL
Anion gap: 8 (ref 5–15)
BUN: 19 mg/dL (ref 6–20)
CO2: 24 mmol/L (ref 22–32)
Calcium: 9.2 mg/dL (ref 8.9–10.3)
Chloride: 103 mmol/L (ref 98–111)
Creatinine, Ser: 1.03 mg/dL (ref 0.61–1.24)
GFR, Estimated: >60 mL/min (ref >60)
Glucose, Bld: 99 mg/dL (ref 70–99)
Potassium: 3.7 mmol/L (ref 3.5–5.1)
Sodium: 135 mmol/L (ref 135–145)

## 2023-10-08 LAB — TROPONIN I (HIGH SENSITIVITY)
Troponin I (High Sensitivity): 3 ng/L (ref <18)
Troponin I (High Sensitivity): 3 ng/L (ref <18)

## 2023-10-08 NOTE — ED Provider Notes (Addendum)
 [place] EMERGENCY DEPARTMENT AT [facility] Provider Note   CSN: [id number] Arrival date & time: 10/08/23  1706     History  Chief Complaint  Patient presents with   Chest Pain    Logan Benton is a 43 y.o. male.  Patient with acute onset of left anterior chest pain radiating to the left jaw that occurred about an hour prior to arrival.  Associated with shortness of breath.  Patient has been seen by cardiology in the past for atypical chest pain.  They also are following him for syncope.  Past medical history significant for hypertension.  Patient is a former smoker quit in 2021.       Home Medications Prior to Admission medications   Medication Sig Start Date End Date Taking? Authorizing Provider  amoxicillin -clavulanate (AUGMENTIN ) 875-125 MG tablet Take 1 tablet by mouth 2 (two) times daily. 09/23/23   Cook, Jayce G, DO  nebivolol  (BYSTOLIC ) 2.5 MG tablet Take 1.5 tablets (3.75 mg total) by mouth daily. 10/05/23   Walker, Caitlin S, NP      Allergies    Nsaids    Review of Systems   Review of Systems  Constitutional:  Negative for chills and fever.  HENT:  Negative for ear pain and sore throat.   Eyes:  Negative for pain and visual disturbance.  Respiratory:  Positive for shortness of breath. Negative for cough.   Cardiovascular:  Positive for chest pain. Negative for palpitations.  Gastrointestinal:  Negative for abdominal pain and vomiting.  Genitourinary:  Negative for dysuria and hematuria.  Musculoskeletal:  Negative for arthralgias and back pain.  Skin:  Negative for color change and rash.  Neurological:  Negative for seizures and syncope.  All other systems reviewed and are negative.   Physical Exam Updated Vital Signs BP 126/83 (BP Location: Right Arm)   Pulse 62   Temp 98.2 F (36.8 C)   Resp 16   Ht 1.626 m (5' 4)   Wt 76.3 kg   SpO2 100%   BMI 28.87 kg/m  Physical Exam Vitals and nursing note reviewed.  Constitutional:       General: He is not in acute distress.    Appearance: Normal appearance. He is well-developed.  HENT:     Head: Normocephalic and atraumatic.  Eyes:     Extraocular Movements: Extraocular movements intact.     Conjunctiva/sclera: Conjunctivae normal.     Pupils: Pupils are equal, round, and reactive to light.  Cardiovascular:     Rate and Rhythm: Normal rate and regular rhythm.     Heart sounds: No murmur heard. Pulmonary:     Effort: Pulmonary effort is normal. No respiratory distress.     Breath sounds: Normal breath sounds.  Abdominal:     Palpations: Abdomen is soft.     Tenderness: There is no abdominal tenderness.  Musculoskeletal:        General: No swelling.     Cervical back: Neck supple.  Skin:    General: Skin is warm and dry.     Capillary Refill: Capillary refill takes less than 2 seconds.  Neurological:     General: No focal deficit present.     Mental Status: He is alert and oriented to person, place, and time.  Psychiatric:        Mood and Affect: Mood normal.     ED Results / Procedures / Treatments   Labs (all labs ordered are listed, but only abnormal results are displayed)  Labs Reviewed - No data to display  EKG EKG Interpretation Date/Time:  Thursday October 08 2023 17:32:56 EST Ventricular Rate:  63 PR Interval:  130 QRS Duration:  114 QT Interval:  390 QTC Calculation: 399 R Axis:   57  Text Interpretation: Normal sinus rhythm Incomplete right bundle branch block Nonspecific ST abnormality Abnormal ECG When compared with ECG of 22-Sep-2023 14:27, No significant change was found Confirmed by Viha Kriegel [phone]) on 10/08/2023 5:49:32 PM  Radiology No results found.  Procedures Procedures    Medications Ordered in ED Medications - No data to display  ED Course/ Medical Decision Making/ A&P                                 Medical Decision Making Amount and/or Complexity of Data Reviewed Labs: ordered. Radiology: ordered.   EKG  without any acute changes.  Based on the fact that patient's chest pain just started an hour prior and is still ongoing we will need to do delta troponins.  Patient will get chest x-ray basic labs.  Delta troponins both 3 very reassuring.  White count 6.1 hemoglobin 15.1 platelets 240 basic metabolic panel normal chest x-ray without any acute findings.  No evidence of acute cardiac event.  Patient stable for discharge and follow back up with cardiology.   Final Clinical Impression(s) / ED Diagnoses Final diagnoses:  Atypical chest pain    Rx / DC Orders ED Discharge Orders     None         Geraldene Hamilton, MD 10/08/23 NELIDA    Geraldene Hamilton, MD 10/08/23 1811    Geraldene Hamilton, MD 10/08/23 2201

## 2023-10-08 NOTE — Discharge Instructions (Signed)
 Workup for the chest pain here without any acute findings chest x-ray also normal.  Follow-up with your doctor as well as cardiology.  Return for any new or worse symptoms.  Work note provided.

## 2023-10-08 NOTE — ED Notes (Signed)
Patient verbalizes understanding of discharge instructions. Opportunity for questioning and answers were provided. Armband removed by staff, pt discharged from ED. Ambulated out to lobby  

## 2023-10-08 NOTE — ED Triage Notes (Signed)
 Pt BIB ems for chest pain/ pressure 8/10 after nitroglycerin 5/10. Pt does have cardiac monitor on trying to r/o Brugoda. Pt appear in no distress at this time.

## 2023-10-12 ENCOUNTER — Ambulatory Visit: Payer: 59

## 2023-10-19 ENCOUNTER — Encounter (HOSPITAL_BASED_OUTPATIENT_CLINIC_OR_DEPARTMENT_OTHER): Payer: Self-pay

## 2023-10-19 MED ORDER — NEBIVOLOL HCL 5 MG PO TABS: 5.0000 mg | ORAL_TABLET | Freq: Every day | ORAL | 1 refills | Status: DC

## 2023-10-22 ENCOUNTER — Ambulatory Visit: Payer: 59

## 2023-10-27 ENCOUNTER — Ambulatory Visit: Payer: 59 | Admitting: Nurse Practitioner

## 2023-11-05 NOTE — Progress Notes (Signed)
[facility] Summary Report / Loop Recorder

## 2023-11-06 ENCOUNTER — Ambulatory Visit: Payer: 59

## 2023-11-12 ENCOUNTER — Ambulatory Visit: Payer: 59

## 2023-11-23 ENCOUNTER — Ambulatory Visit (INDEPENDENT_AMBULATORY_CARE_PROVIDER_SITE_OTHER): Payer: Self-pay

## 2023-11-23 ENCOUNTER — Ambulatory Visit: Payer: 59 | Admitting: Nurse Practitioner

## 2023-11-23 ENCOUNTER — Encounter: Payer: Self-pay | Admitting: Nurse Practitioner

## 2023-11-23 ENCOUNTER — Ambulatory Visit: Payer: 59

## 2023-11-23 ENCOUNTER — Ambulatory Visit (HOSPITAL_COMMUNITY)
Admission: RE | Admit: 2023-11-23 | Discharge: 2023-11-23 | Disposition: A | Discharge status: Home / Self Care | Payer: 59 | Source: Ambulatory Visit | Attending: Nurse Practitioner | Admitting: Nurse Practitioner

## 2023-11-23 VITALS — BP 116/78 | HR 56 | Temp 98.6°F | Ht 64.0 in | Wt 164.0 lb

## 2023-11-23 DIAGNOSIS — R079 Chest pain, unspecified: Secondary | ICD-10-CM | POA: Diagnosis present

## 2023-11-23 DIAGNOSIS — B9689 Other specified bacterial agents as the cause of diseases classified elsewhere: Secondary | ICD-10-CM

## 2023-11-23 DIAGNOSIS — J019 Acute sinusitis, unspecified: Secondary | ICD-10-CM | POA: Diagnosis not present

## 2023-11-23 DIAGNOSIS — R001 Bradycardia, unspecified: Secondary | ICD-10-CM | POA: Diagnosis not present

## 2023-11-23 DIAGNOSIS — Z87898 Personal history of other specified conditions: Secondary | ICD-10-CM

## 2023-11-23 MED ORDER — AMOXICILLIN-POT CLAVULANATE 875-125 MG PO TABS: 1.0000 | ORAL_TABLET | Freq: Two times a day (BID) | ORAL | 0 refills | Status: DC

## 2023-11-23 NOTE — Progress Notes (Signed)
   Subjective:    Patient ID: Logan Benton, male    DOB: 08-22-1981, 43 y.o.   MRN: [id number]  HPI Presents for plaints of cough and congestion.  Had a head cold about 2 weeks ago, had gotten better.  5 days ago symptoms came back.  Experiencing fatigue.  No fever.  Coughing at times mainly at night and first thing in the morning.  Has had some left-sided lower anterior chest wall discomfort at rest and more with deep breath.  Was recently seen at ED on 10/08/2023 for atypical chest pain and a normal chest x-ray.  Patient states this feels different.  No shortness of breath or wheezing.  Producing yellow nasal drainage with occasional yellow sputum.  Pressure around the face with sinus area headache.  Scratchy burning throat.  No ear pain.  Has tried OTC naturals for his symptoms which has helped. Social History   Tobacco Use   Smoking status: Former    Current packs/day: 0.00    Types: Cigarettes    Quit date: 11/03/2019    Years since quitting: 4.0    Passive exposure: Past   Smokeless tobacco: Never   Tobacco comments:    nicoten gum  Vaping Use   Vaping status: Former   Quit date: 03/07/2019  Substance Use Topics   Alcohol use: No   Drug use: Yes    Types: Marijuana          Objective:   Physical Exam NAD.  Alert, oriented.  TMs retracted bilaterally, no erythema.  Pharynx mildly erythematous with yellowish PND noted.  Neck supple with mild soft anterior cervical adenopathy.  Lungs overall clear with what appears to be some faint expiratory crackles in the anterior left lower lobe.  No tachypnea.  Normal color.  Heart regular rate rhythm. Today's Vitals   11/23/23 1539  BP: 116/78  Pulse: (!) 56  Temp: 98.6 F (37 C)  SpO2: 98%  Weight: 164 lb (74.4 kg)  Height: 5\' 4"  (1.626 m)   Body mass index is 28.15 kg/m.       Assessment & Plan:  Acute bacterial rhinosinusitis  Left-sided chest pain - Plan: DG Chest 2 View Meds ordered this encounter  Medications    amoxicillin-clavulanate (AUGMENTIN) 875-125 MG tablet    Sig: Take 1 tablet by mouth 2 (two) times daily.    Dispense:  14 tablet    Refill:  0    Supervising Provider:   Lilyan Punt A [[id number]]   Start Augmentin as directed.  Add Flonase nasal spray as directed. Stat chest x-ray to assess left sided chest pain. Warning signs reviewed.  Follow-up based on chest x-ray results.  Recheck if symptoms worsen or persist.

## 2023-11-24 ENCOUNTER — Encounter: Payer: Self-pay | Admitting: Nurse Practitioner

## 2023-11-24 LAB — CUP PACEART REMOTE DEVICE CHECK
Date Time Interrogation Session: 20250217002200
Implantable Pulse Generator Implant Date: 20230621
Pulse Gen Serial Number: 177683

## 2023-11-25 ENCOUNTER — Encounter (HOSPITAL_BASED_OUTPATIENT_CLINIC_OR_DEPARTMENT_OTHER): Payer: Self-pay

## 2023-12-01 ENCOUNTER — Encounter: Payer: Self-pay | Admitting: Nurse Practitioner

## 2023-12-03 ENCOUNTER — Encounter: Payer: Self-pay | Admitting: Family Medicine

## 2023-12-03 ENCOUNTER — Other Ambulatory Visit: Payer: Self-pay | Admitting: Family Medicine

## 2023-12-03 MED ORDER — DOXYCYCLINE HYCLATE 100 MG PO TABS: 100.0000 mg | ORAL_TABLET | Freq: Two times a day (BID) | ORAL | 0 refills | Status: DC

## 2023-12-03 MED ORDER — LEVOFLOXACIN 500 MG PO TABS: 500.0000 mg | ORAL_TABLET | Freq: Every day | ORAL | 0 refills | Status: DC

## 2023-12-11 ENCOUNTER — Ambulatory Visit: Payer: 59 | Attending: Nurse Practitioner | Admitting: Nurse Practitioner

## 2023-12-11 ENCOUNTER — Encounter: Payer: Self-pay | Admitting: Nurse Practitioner

## 2023-12-11 ENCOUNTER — Ambulatory Visit: Payer: 59

## 2023-12-11 VITALS — BP 116/68 | Ht 64.0 in | Wt 162.0 lb

## 2023-12-11 DIAGNOSIS — I498 Other specified cardiac arrhythmias: Secondary | ICD-10-CM

## 2023-12-11 DIAGNOSIS — R0789 Other chest pain: Secondary | ICD-10-CM | POA: Diagnosis not present

## 2023-12-11 DIAGNOSIS — R001 Bradycardia, unspecified: Secondary | ICD-10-CM

## 2023-12-11 DIAGNOSIS — Z87898 Personal history of other specified conditions: Secondary | ICD-10-CM

## 2023-12-11 NOTE — Patient Instructions (Signed)
 Medication Instructions:  Your physician recommends that you continue on your current medications as directed. Please refer to the Current Medication list given to you today.  Labwork: none  Testing/Procedures: none  Follow-Up: Your physician recommends that you schedule a follow-up appointment in: 6 months with [name]  Any Other Special Instructions Will Be Listed Below (If Applicable).  If you need a refill on your cardiac medications before your next appointment, please call your pharmacy.

## 2023-12-11 NOTE — Progress Notes (Signed)
 Cardiology Office Note:  .   Date:  12/11/2023 ID:  Logan Benton, DOB 06/15/1981, MRN [id number] PCP: Tommie Sams, DO  [facility] Providers Cardiologist:  Charlton Haws, MD Electrophysiologist:  Will Jorja Loa, MD    History of Present Illness: .   Logan Benton is a 43 y.o. 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   Last seen by Gillian Shields, NP on September 22, 2023 for follow-up. Reported intermittent chest discomfort, associated jaw pain at rest or activity. Was very active. Noted that patient was eating a healthy diet. CP considered atypical, past workup reassuring, no indication for ischemic evaluation. Was overall doing well on Nebivolol, 46 bpm in office. Pt requested dosage of Nebivolol to be reduced to 2.5 mg daily via MyChart on 09/28/2024.  Today he presents for scheduled follow-up.  He states he is doing well. Continues to note intermittent chest discomfort, associated symptoms include palpitations while exercising. Doing well on current dose of nebivolol, tolerating well. Denies any shortness of breath, syncope, presyncope, dizziness, orthopnea, PND, swelling or significant weight changes, acute bleeding, or claudication.  ROS: Negative. See HPI.  FH: No known FH of CVD.   Studies Reviewed: Marland Kitchen    EKG: EKG Interpretation Date/Time:  Friday December 11 2023 15:33:04 EST Ventricular Rate:  56 PR Interval:  122 QRS Duration:  112 QT Interval:  404 QTC Calculation: 389 R Axis:   53  Text Interpretation: Sinus bradycardia Brugada pattern, type 1 When compared with ECG of 08-Oct-2023 17:32, No significant change was found Confirmed by Sharlene Dory [phone]) on 12/11/2023 3:37:02 PM   ETT 03/2022:    Excellent exercise capacity, achieved 17.2 METS   Peak heart rate 176 bpm (96% max age predicted HR)   No ST deviation was noted.   No evidence of ischemia  Echo 03/2022:  1. Left  ventricular ejection fraction, by estimation, is 60 to 65%. The  left ventricle has normal function. The left ventricle has no regional  wall motion abnormalities. Left ventricular diastolic parameters were  normal.   2. Right ventricular systolic function is normal. The right ventricular  size is normal. There is normal pulmonary artery systolic pressure. The  estimated right ventricular systolic pressure is 25.7 mmHg.   3. The mitral valve is normal in structure. Trivial mitral valve  regurgitation.   4. The aortic valve is tricuspid. Aortic valve regurgitation is not  visualized. No aortic stenosis is present.   5. The inferior vena cava is normal in size with greater than 50%  respiratory variability, suggesting right atrial pressure of 3 mmHg.   Comparison(s): No prior Echocardiogram.  CCTA 05/2021:  IMPRESSION: 1. Calcium score 0   2.  CAD RADS 0 normal right dominant coronary arteries   3.  Normal aortic root 2.8 cm  Cardiac monitor 01/2020:  Sinus rhythm, sinus arrhythmia, sinus bradycardia, and sinus tachycardia. Symptoms corresponded with all of the above.  Risk Assessment/Calculations:        The 10-year ASCVD risk score (Arnett DK, et al., 2019) is: 0.3%   Values used to calculate the score:     Age: 66 years     Sex: Male     Is Non-Hispanic African American: No     Diabetic: No     Tobacco smoker: No     Systolic Blood Pressure: 116 mmHg     Is BP treated: Yes     HDL  Cholesterol: 85 mg/dL     Total Cholesterol: 163 mg/dL  Physical Exam:   VS:  BP 116/68 (BP Location: Left Arm, Patient Position: Sitting, Cuff Size: Normal)   Ht 5\' 4"  (1.626 m)   Wt 162 lb (73.5 kg)   SpO2 98%   BMI 27.81 kg/m    Wt Readings from Last 3 Encounters:  12/11/23 162 lb (73.5 kg)  11/23/23 164 lb (74.4 kg)  10/08/23 168 lb 3 oz (76.3 kg)    GEN: Well nourished, well developed in no acute distress NECK: No JVD; No carotid bruits CARDIAC: S1/S2, slow rate and regular  rhythm, no murmurs, rubs, gallops RESPIRATORY:  Clear to auscultation without rales, wheezing or rhonchi  ABDOMEN: Soft, non-tender, non-distended EXTREMITIES:  No edema; No deformity   ASSESSMENT AND PLAN: .    Atypical chest pain Continues to note intermittent chest tightness, stable over time. Sounds MSK in etiology, atypical for cardiac in etiology. EKG negative for any acute ischemic changes today. No indication for ischemic evaluation. Reassuring ETT in 2023. No medication changes at this time. Care and ED precautions discussed.   2. Brugada type 1 pattern, hx of syncope, bradycardia Seen on ECG today. Followed by EP, currently has ILR. Denies any recurrent syncope. Most recent ILR report revealed no arrhythmias that were seen. Continue nebivolol. HR 56 bpm, asymptomatic. Denies any concerning signs of symptoms. Continue to follow-up with EP. Care and ED precautions discussed.      Dispo: Follow-up with Dr. Eden Emms or APP in 6 months or sooner if anything changes.   Signed, Sharlene Dory, NP

## 2023-12-14 ENCOUNTER — Ambulatory Visit: Payer: 59

## 2023-12-24 ENCOUNTER — Ambulatory Visit: Payer: 59

## 2023-12-28 ENCOUNTER — Ambulatory Visit (INDEPENDENT_AMBULATORY_CARE_PROVIDER_SITE_OTHER): Payer: Self-pay

## 2023-12-28 DIAGNOSIS — R55 Syncope and collapse: Secondary | ICD-10-CM | POA: Diagnosis not present

## 2023-12-28 LAB — CUP PACEART REMOTE DEVICE CHECK
Date Time Interrogation Session: 20250324002400
Implantable Pulse Generator Implant Date: 20230621
Pulse Gen Serial Number: 177683

## 2024-01-04 NOTE — Addendum Note (Signed)
 Addended by: Geralyn Flash D on: 01/04/2024 11:53 AM   Modules accepted: Orders

## 2024-01-04 NOTE — Progress Notes (Signed)
 Bsx Loop Recorder

## 2024-01-14 ENCOUNTER — Ambulatory Visit: Payer: 59

## 2024-01-15 ENCOUNTER — Ambulatory Visit: Payer: 59

## 2024-01-25 ENCOUNTER — Ambulatory Visit: Payer: 59

## 2024-02-01 ENCOUNTER — Ambulatory Visit (INDEPENDENT_AMBULATORY_CARE_PROVIDER_SITE_OTHER): Payer: Self-pay

## 2024-02-01 DIAGNOSIS — R55 Syncope and collapse: Secondary | ICD-10-CM | POA: Diagnosis not present

## 2024-02-02 ENCOUNTER — Encounter: Payer: Self-pay | Admitting: Family Medicine

## 2024-02-03 LAB — CUP PACEART REMOTE DEVICE CHECK
Date Time Interrogation Session: 20250429131800
Implantable Pulse Generator Implant Date: 20230621
Pulse Gen Serial Number: 177683

## 2024-02-04 ENCOUNTER — Encounter: Payer: Self-pay | Admitting: Internal Medicine

## 2024-02-04 ENCOUNTER — Telehealth (INDEPENDENT_AMBULATORY_CARE_PROVIDER_SITE_OTHER): Payer: Self-pay | Admitting: Internal Medicine

## 2024-02-04 NOTE — Telephone Encounter (Signed)
 Pt called asking to make an OV ASAP. Please call [phone]

## 2024-02-05 ENCOUNTER — Emergency Department (HOSPITAL_COMMUNITY)
Admission: EM | Admit: 2024-02-05 | Discharge: 2024-02-05 | Disposition: A | Discharge status: Home / Self Care | Attending: Emergency Medicine | Admitting: Emergency Medicine

## 2024-02-05 ENCOUNTER — Other Ambulatory Visit: Payer: Self-pay

## 2024-02-05 ENCOUNTER — Encounter (HOSPITAL_COMMUNITY): Payer: Self-pay | Admitting: Emergency Medicine

## 2024-02-05 ENCOUNTER — Telehealth: Payer: Self-pay

## 2024-02-05 ENCOUNTER — Emergency Department (HOSPITAL_COMMUNITY)

## 2024-02-05 DIAGNOSIS — R112 Nausea with vomiting, unspecified: Secondary | ICD-10-CM | POA: Diagnosis not present

## 2024-02-05 DIAGNOSIS — R197 Diarrhea, unspecified: Secondary | ICD-10-CM | POA: Diagnosis not present

## 2024-02-05 DIAGNOSIS — Z79899 Other long term (current) drug therapy: Secondary | ICD-10-CM | POA: Diagnosis not present

## 2024-02-05 DIAGNOSIS — I10 Essential (primary) hypertension: Secondary | ICD-10-CM | POA: Insufficient documentation

## 2024-02-05 DIAGNOSIS — R1031 Right lower quadrant pain: Secondary | ICD-10-CM | POA: Diagnosis present

## 2024-02-05 LAB — URINALYSIS, ROUTINE W REFLEX MICROSCOPIC
Bilirubin Urine: NEGATIVE
Glucose, UA: NEGATIVE mg/dL
Hgb urine dipstick: NEGATIVE
Ketones, ur: 5 mg/dL — AB
Leukocytes,Ua: NEGATIVE
Nitrite: NEGATIVE
Protein, ur: NEGATIVE mg/dL
Specific Gravity, Urine: 1.005 (ref 1.005–1.030)
pH: 6 (ref 5.0–8.0)

## 2024-02-05 LAB — COMPREHENSIVE METABOLIC PANEL WITH GFR
ALT: 48 U/L — ABNORMAL HIGH (ref 0–44)
AST: 35 U/L (ref 15–41)
Albumin: 4.5 g/dL (ref 3.5–5.0)
Alkaline Phosphatase: 48 U/L (ref 38–126)
Anion gap: 9 (ref 5–15)
BUN: 16 mg/dL (ref 6–20)
CO2: 20 mmol/L — ABNORMAL LOW (ref 22–32)
Calcium: 9.3 mg/dL (ref 8.9–10.3)
Chloride: 104 mmol/L (ref 98–111)
Creatinine, Ser: 1.08 mg/dL (ref 0.61–1.24)
GFR, Estimated: >60 mL/min (ref >60)
Glucose, Bld: 97 mg/dL (ref 70–99)
Potassium: 4 mmol/L (ref 3.5–5.1)
Sodium: 133 mmol/L — ABNORMAL LOW (ref 135–145)
Total Bilirubin: 1.2 mg/dL (ref 0.0–1.2)
Total Protein: 7.2 g/dL (ref 6.5–8.1)

## 2024-02-05 LAB — CBC
HCT: 50.3 % (ref 39.0–52.0)
Hemoglobin: 16.9 g/dL (ref 13.0–17.0)
MCH: 31.5 pg (ref 26.0–34.0)
MCHC: 33.6 g/dL (ref 30.0–36.0)
MCV: 93.7 fL (ref 80.0–100.0)
Platelets: 230 10*3/uL (ref 150–400)
RBC: 5.37 MIL/uL (ref 4.22–5.81)
RDW: 12.3 % (ref 11.5–15.5)
WBC: 6.6 10*3/uL (ref 4.0–10.5)
nRBC: 0 % (ref 0.0–0.2)

## 2024-02-05 LAB — LIPASE, BLOOD: Lipase: 30 U/L (ref 11–51)

## 2024-02-05 MED ORDER — IOHEXOL 300 MG/ML  SOLN
100.0000 mL | Freq: Once | INTRAMUSCULAR | Status: AC | PRN
  Administered 2024-02-05: 100 mL via INTRAVENOUS

## 2024-02-05 MED ORDER — ONDANSETRON HCL 4 MG/2ML IJ SOLN
4.0000 mg | Freq: Four times a day (QID) | INTRAMUSCULAR | Status: DC | PRN
Start: 2024-02-05 — End: 2024-02-05
  Administered 2024-02-05: 4 mg via INTRAVENOUS
  Filled 2024-02-05: qty 2

## 2024-02-05 MED ORDER — ONDANSETRON 4 MG PO TBDP: 4.0000 mg | ORAL_TABLET | Freq: Three times a day (TID) | ORAL | 0 refills | Status: DC | PRN

## 2024-02-05 MED ORDER — HYDROMORPHONE HCL 1 MG/ML IJ SOLN
0.5000 mg | Freq: Once | INTRAMUSCULAR | Status: AC
  Administered 2024-02-05: 0.5 mg via INTRAVENOUS
  Filled 2024-02-05: qty 0.5

## 2024-02-05 NOTE — ED Provider Notes (Signed)
 [facility] Provider Note   CSN: [id number] Arrival date & time: 02/05/24  1222     History  Chief Complaint  Patient presents with   Abdominal Pain    Logan Benton is a 43 y.o. male with PMH as listed below who presents with RLQ abdominal pain with n/v/d x 10 days. Started in the center of his belly and on Wednesday moved to sharp stabbing severe worst pain ever RLQ pain. Improved later on Wednesday night and now just feels "fullness and pressure" in the right side of his abdomen. H/o cholecystectomy. A/w nausea/vomiting and cold sweats on Wednesday. A/w 3-4 episodes of soft stools/diarrhea, nonbloody and daily. No h/o kidney stones, no urinary sxs.   Past Medical History:  Diagnosis Date   Abscessed tooth    C. difficile diarrhea    presumptive diagnosis   Hypertension    Trigger finger        Home Medications Prior to Admission medications   Medication Sig Start Date End Date Taking? Authorizing Provider  ascorbic acid (VITAMIN C) 500 MG tablet Take 500 mg by mouth daily.   Yes [provider]  Multiple Vitamin (MULTIVITAMIN PO) Take 1 tablet by mouth daily.   Yes [provider]  nebivolol  (BYSTOLIC ) 5 MG tablet Take 1 tablet (5 mg total) by mouth daily. 10/19/23  Yes Clearnce Curia, NP  ondansetron  (ZOFRAN -ODT) 4 MG disintegrating tablet Take 1 tablet (4 mg total) by mouth every 8 (eight) hours as needed for nausea or vomiting. 02/05/24  Yes Merdis Stalling, MD      Allergies    Nsaids    Review of Systems   Review of Systems A 10 point review of systems was performed and is negative unless otherwise reported in HPI.  Physical Exam Updated Vital Signs BP (!) 143/89   Pulse (!) 49   Temp 98.3 F (36.8 C) (Oral)   Resp 11   Ht 5\' 11"  (1.803 m)   Wt 72.6 kg   SpO2 100%   BMI 22.32 kg/m  Physical Exam General: Normal appearing male, lying in bed.  HEENT: PERRLA, Sclera anicteric, MMM, trachea  midline.  Cardiology: RRR, no murmurs/rubs/gallops. BL radial and DP pulses equal bilaterally.  Resp: Normal respiratory rate and effort. CTAB, no wheezes, rhonchi, crackles.  Abd: Soft, non-tender, non-distended. No rebound tenderness or guarding.  GU: Deferred. MSK: No peripheral edema or signs of trauma. Extremities without deformity or TTP. No cyanosis or clubbing. Skin: warm, dry. No rashes or lesions. Back: No CVA tenderness Neuro: A&Ox4, CNs II-XII grossly intact. MAEs. Sensation grossly intact.  Psych: Normal mood and affect.   ED Results / Procedures / Treatments   Labs (all labs ordered are listed, but only abnormal results are displayed) Labs Reviewed  COMPREHENSIVE METABOLIC PANEL WITH GFR - Abnormal; Notable for the following components:      Result Value   Sodium 133 (*)    CO2 20 (*)    ALT 48 (*)    All other components within normal limits  URINALYSIS, ROUTINE W REFLEX MICROSCOPIC - Abnormal; Notable for the following components:   Color, Urine STRAW (*)    Ketones, ur 5 (*)    All other components within normal limits  LIPASE, BLOOD  CBC    EKG None  Radiology CT ABDOMEN PELVIS W CONTRAST Result Date: 02/05/2024 CLINICAL DATA:  Right lower quadrant abdominal pain for 10 days. EXAM: CT ABDOMEN AND PELVIS WITH  CONTRAST TECHNIQUE: Multidetector CT imaging of the abdomen and pelvis was performed using the standard protocol following bolus administration of intravenous contrast. RADIATION DOSE REDUCTION: This exam was performed according to the departmental dose-optimization program which includes automated exposure control, adjustment of the mA and/or kV according to patient size and/or use of iterative reconstruction technique. CONTRAST:  OMNIPAQUE  IOHEXOL  300 MG/ML  SOLN COMPARISON:  April 08, 2022. FINDINGS: Lower chest: No acute abnormality. Hepatobiliary: No focal liver abnormality is seen. Status post cholecystectomy. No biliary dilatation. Pancreas:  Unremarkable. No pancreatic ductal dilatation or surrounding inflammatory changes. Spleen: Normal in size without focal abnormality. Adrenals/Urinary Tract: Adrenal glands are unremarkable. Kidneys are normal, without renal calculi, focal lesion, or hydronephrosis. Bladder is unremarkable. Stomach/Bowel: Stomach is within normal limits. Appendix appears normal. No evidence of bowel wall thickening, distention, or inflammatory changes. Vascular/Lymphatic: No significant vascular findings are present. No enlarged abdominal or pelvic lymph nodes. Reproductive: Prostate is unremarkable. Other: No abdominal wall hernia or abnormality. No abdominopelvic ascites. Musculoskeletal: No acute or significant osseous findings. IMPRESSION: No definite abnormality seen in the abdomen or pelvis. Electronically Signed   By: Rosalene Colon M.D.   On: 02/05/2024 15:41    Procedures Procedures    Medications Ordered in ED Medications  ondansetron  (ZOFRAN ) injection 4 mg (4 mg Intravenous Given 02/05/24 1440)  HYDROmorphone  (DILAUDID ) injection 0.5 mg (0.5 mg Intravenous Given 02/05/24 1440)  iohexol  (OMNIPAQUE ) 300 MG/ML solution 100 mL (100 mLs Intravenous Contrast Given 02/05/24 1508)    ED Course/ Medical Decision Making/ A&P                          Medical Decision Making Amount and/or Complexity of Data Reviewed Labs: ordered. Decision-making details documented in ED Course. Radiology: ordered. Decision-making details documented in ED Course.  Risk Prescription drug management.    This patient presents to the ED for concern of RLQ abd pain, N/V/D, this involves an extensive number of treatment options, and is a complaint that carries with it a high risk of complications and morbidity.  I considered the following differential and admission for this acute, potentially life threatening condition. HDS, well-appearing.  MDM:    Greatest c/f appendicitis given presenting sxs. Also consider urolithiasis.  Already s/p cholecystectomy. Also consider gastroenteritis given N/V/D. No c/f pancreatitis, diverticulitis. UA neg for UTI.  Clinical Course as of 02/05/24 1556  Fri Feb 05, 2024  1402 Lipase: 30 neg [HN]  1402 WBC: 6.6 No leukocytosis  [HN]  1403 ALT(!): 48 Mildly elevated otherwise CMP largely unremarkable [HN]  1432 Urinalysis, Routine w reflex microscopic -Urine, Clean Catch(!) neg [HN]  1549 CT ABDOMEN PELVIS W CONTRAST Radiology reads no abnormalities. [HN]    Clinical Course User Index [HN] Merdis Stalling, MD    Labs: I Ordered, and personally interpreted labs.  The pertinent results include:   those listed above  Imaging Studies ordered: I ordered imaging studies including CT abd pelvis I independently visualized and interpreted imaging. I agree with the radiologist interpretation  Additional history obtained from chart reivew.   Reevaluation: After the interventions noted above, I reevaluated the patient and found that they have :improved  Social Determinants of Health: Lives independently  Disposition:  Pt with reassuring CT abd pelvis, no appendicitis or other abnormalities. Patient w/ N/V/D and abd pain, possible gastroenteritis as cause? Given rx for zofran  ODT and instructed to take imodium at home if diarrhea continues, and to stay well  hydrated. Instructed to f/u with PCP within 1 week. Pt is overall well-appearing, non-toxic, HDS. Patient is stable for DC. DC w/ discharge instructions/return precautions. All questions answered to patient's satisfaction.    Co morbidities that complicate the patient evaluation  Past Medical History:  Diagnosis Date   Abscessed tooth    C. difficile diarrhea    presumptive diagnosis   Hypertension    Trigger finger      Medicines Meds ordered this encounter  Medications   HYDROmorphone  (DILAUDID ) injection 0.5 mg   ondansetron  (ZOFRAN ) injection 4 mg   iohexol  (OMNIPAQUE ) 300 MG/ML solution 100 mL   ondansetron   (ZOFRAN -ODT) 4 MG disintegrating tablet    Sig: Take 1 tablet (4 mg total) by mouth every 8 (eight) hours as needed for nausea or vomiting.    Dispense:  20 tablet    Refill:  0    I have reviewed the patients home medicines and have made adjustments as needed  Problem List / ED Course: Problem List Items Addressed This Visit   None Visit Diagnoses       Right lower quadrant abdominal pain    -  Primary                   This note was created using dictation software, which may contain spelling or grammatical errors.    Merdis Stalling, MD 02/05/24 [phone]

## 2024-02-05 NOTE — ED Triage Notes (Signed)
 Pt c/o of RLQ abdominal pain with n/v/d x 10 days.

## 2024-02-05 NOTE — Discharge Instructions (Addendum)
 Thank you for coming to [facility] Emergency Department. You were seen for abdominal pain, nausea vomiting and diarrhea. We did an exam, labs, and imaging, and these showed  no acute findings.  We have prescribed Zofran  under the tongue every 6-8 hours as needed for nausea and vomiting.  Please stay well-hydrated at home.  You can use Imodium over-the-counter for 2 to 3 days but no longer than that for diarrhea.  Please follow up with your primary care provider within 1 week.   Do not hesitate to return to the ED or call 911 if you experience: -Worsening symptoms -Nausea vomiting so severe you cannot eat or drink anything -Lightheadedness, passing out -Fevers/chills -Anything else that concerns you  You received opiate pain medication while here in the emergency department today.  Please do not drive for the rest of the evening.

## 2024-02-05 NOTE — Telephone Encounter (Signed)
 Phoned the pt and was advised by him he was going to the ED because he spoke to a Dr with his [employer] and they advised him to ED due to pt needing his appendix to be evaluated.

## 2024-02-15 ENCOUNTER — Ambulatory Visit: Payer: 59

## 2024-02-16 ENCOUNTER — Ambulatory Visit: Payer: 59 | Admitting: Cardiology

## 2024-02-16 NOTE — Progress Notes (Signed)
 Clarity Loop Recorder

## 2024-02-19 ENCOUNTER — Ambulatory Visit: Payer: 59

## 2024-02-25 ENCOUNTER — Ambulatory Visit: Payer: 59

## 2024-03-03 ENCOUNTER — Ambulatory Visit (INDEPENDENT_AMBULATORY_CARE_PROVIDER_SITE_OTHER): Payer: Self-pay

## 2024-03-03 DIAGNOSIS — R001 Bradycardia, unspecified: Secondary | ICD-10-CM | POA: Diagnosis not present

## 2024-03-07 ENCOUNTER — Ambulatory Visit: Payer: Self-pay

## 2024-03-08 LAB — CUP PACEART REMOTE DEVICE CHECK
Date Time Interrogation Session: 20250602172100
Implantable Pulse Generator Implant Date: 20230621
Pulse Gen Serial Number: 177683

## 2024-03-09 ENCOUNTER — Telehealth: Payer: Self-pay

## 2024-03-09 NOTE — Telephone Encounter (Signed)
 Device alert for Tachy:  SVT (35 sec)  Heart rate: 210 - 225bpm. (6/3 at 4pm) ILR implanted for syncope.   Spoke with patient.  He was lifting weights and doing a particularly intense routine. He did feel his heart start to race and he got a little light headed.  He sat down, had some fluids and immediately rebounded.  States he has these little events from time to time when he works out.  He backs off if this happens.  Continues to work out and is taking his Nebivolol  daily.   He is doing well.  Will continue to monitor.

## 2024-03-14 ENCOUNTER — Encounter: Payer: Self-pay | Admitting: Family Medicine

## 2024-03-16 ENCOUNTER — Ambulatory Visit: Payer: Self-pay | Admitting: Cardiology

## 2024-03-16 ENCOUNTER — Other Ambulatory Visit: Payer: Self-pay | Admitting: Family Medicine

## 2024-03-16 DIAGNOSIS — F09 Unspecified mental disorder due to known physiological condition: Secondary | ICD-10-CM

## 2024-03-17 ENCOUNTER — Ambulatory Visit: Payer: 59

## 2024-03-24 NOTE — Addendum Note (Signed)
 Addended by: Edra Govern D on: 03/24/2024 01:04 PM   Modules accepted: Orders

## 2024-03-24 NOTE — Progress Notes (Signed)
 Bsx Loop Recorder

## 2024-03-25 ENCOUNTER — Ambulatory Visit: Payer: 59

## 2024-03-26 ENCOUNTER — Ambulatory Visit
Admission: EM | Admit: 2024-03-26 | Discharge: 2024-03-26 | Disposition: A | Discharge status: Home / Self Care | Attending: Internal Medicine | Admitting: Internal Medicine

## 2024-03-26 DIAGNOSIS — R21 Rash and other nonspecific skin eruption: Secondary | ICD-10-CM | POA: Insufficient documentation

## 2024-03-26 DIAGNOSIS — Z113 Encounter for screening for infections with a predominantly sexual mode of transmission: Secondary | ICD-10-CM | POA: Diagnosis present

## 2024-03-26 MED ORDER — CEPHALEXIN 500 MG PO CAPS: 500.0000 mg | ORAL_CAPSULE | Freq: Three times a day (TID) | ORAL | 0 refills | Status: AC

## 2024-03-26 NOTE — Discharge Instructions (Signed)
 It's unclear what is causing your rash, however I would like to treat for bacterial infection called cellulitis.  Take keflex antibiotic every 8 hours for the next 7 days. Take with a meal/snack. Take tylenol  as needed for pain associated with rash. If rash spreads significantly in the next 24-48 hours, or if rash does not improve while on antibiotic, please return to urgent care or PCP for re-evaluation.   STD testing pending, this will take 2-3 days to result. We will only call you if your testing is positive for any infection(s) and we will provide treatment.  Avoid sexual intercourse until your STD results come back.  If any of your STD results are positive, you will need to avoid sexual intercourse for 7 days while you are being treated to prevent spread of STD.  Condom use is the best way to prevent spread of STDs. Notify partner(s) of any positive results.  Return to urgent care as needed.

## 2024-03-26 NOTE — ED Provider Notes (Signed)
 [facility]    CSN: [id number] Arrival date & time: 03/26/24  0814      History   Chief Complaint Chief Complaint  Patient presents with   Rash    HPI Logan Benton is a 43 y.o. male.   Patient presents to urgent care for evaluation of rash that started 2-3 days ago to the left axilla, then he noticed some redness starting to the right axilla yesterday. Redness to the left axilla became more prominent yesterday and into this morning.  Describes pain associated with rash as a burning sensation. Rash is not itchy. Denies recent injuries to the axilla, shaving the axillary hair, changes in deodorant or personal hygiene products like laundry detergents etc, and use of new medications. Denies recent fever, chills, nausea, vomiting, neck pain, and recent antibiotic/steroid use. He additionally reports some burning to the tip of the penis starting 1-2 days ago and wonders if this could be connected to rash to bilateral axilla. He is sexually active with multiple male partners with protection and is concerned for STD. Denies urinary symptoms, penile discharge, and groin rash. He has not attempted use of any OTC medications for rash PTA.      Past Medical History:  Diagnosis Date   Abscessed tooth    C. difficile diarrhea    presumptive diagnosis   Hypertension    Trigger finger     Patient Active Problem List   Diagnosis Date Noted   Chronic fatigue 06/28/2023   Neck pain 03/02/2023   Near syncope 03/23/2022   Insomnia 03/21/2021   IBS (irritable bowel syndrome) 02/27/2021   Primary hypertension 02/26/2021   Barrett's esophagus 12/31/2020    Past Surgical History:  Procedure Laterality Date   BILATERAL CARPAL TUNNEL RELEASE     BIOPSY  06/29/2020   Procedure: BIOPSY;  Surgeon: Cindie Carlin POUR, DO;  [facility]: [facility];  Service: Endoscopy;;   BIOPSY  09/03/2020   Procedure: BIOPSY;  Surgeon: Cindie Carlin POUR, DO;  [facility]: [facility];   Service: Endoscopy;;   BIOPSY  03/08/2021   Procedure: BIOPSY;  Surgeon: Cindie Carlin POUR, DO;  [facility];  Service: Endoscopy;;   BIOPSY  05/10/2021   Procedure: BIOPSY;  Surgeon: Cindie Carlin POUR, DO;  [facility]: [facility];  Service: Endoscopy;;   CHOLECYSTECTOMY N/A 04/25/2022   Procedure: LAPAROSCOPIC CHOLECYSTECTOMY;  Surgeon: Kallie Manuelita BROCKS, MD;  Location: [facility];  Service: General;  Laterality: N/A;   COLONOSCOPY WITH PROPOFOL  N/A 06/29/2020   Dr. Cindie: 4 mm tubular adenoma removed, nonbleeding internal hemorrhoids.  Repeat colonoscopy in 7 years.   COLONOSCOPY WITH PROPOFOL  N/A 05/10/2021   Procedure: COLONOSCOPY WITH PROPOFOL ;  Surgeon: Cindie Carlin POUR, DO;  [facility];  Service: Endoscopy;  Laterality: N/A;  1:15pm   ESOPHAGOGASTRODUODENOSCOPY (EGD) WITH PROPOFOL  N/A 06/29/2020   Dr. Cindie: LA grade C esophagitis, gastritis with mild chronic gastritis on path, small bowel biopsy unremarkable.   ESOPHAGOGASTRODUODENOSCOPY (EGD) WITH PROPOFOL  N/A 09/03/2020   Dr. Cindie: Esophageal mucosal changes suggestive of short segment Barrett's esophagus, biopsies consistent.  Next EGD in 3 years   ESOPHAGOGASTRODUODENOSCOPY (EGD) WITH PROPOFOL  N/A 03/08/2021   Procedure: ESOPHAGOGASTRODUODENOSCOPY (EGD) WITH PROPOFOL ;  Surgeon: Cindie Carlin POUR, DO;  [facility];  Service: Endoscopy;  Laterality: N/A;  1:00pm   LOOP RECORDER INSERTION N/A 03/26/2022   Procedure: LOOP RECORDER INSERTION;  Surgeon: Inocencio Soyla Lunger, MD;  Location: [facility];  Service:  Cardiovascular;  Laterality: N/A;   MASS EXCISION Right 04/25/2022   Procedure: EXCISION MASS UPPER RIGHT ABDOMEN;  Surgeon: Kallie Manuelita BROCKS, MD;  Location: [facility];  Service: General;  Laterality: Right;   POLYPECTOMY  06/29/2020   Procedure: POLYPECTOMY;  Surgeon: Cindie Carlin POUR, DO;  [facility];  Service: Endoscopy;;   SHOULDER SURGERY Right    x2   TRIGGER FINGER RELEASE  Right 2016       Home Medications    Prior to Admission medications   Medication Sig Start Date End Date Taking? Authorizing Provider  cephALEXin (KEFLEX) 500 MG capsule Take 1 capsule (500 mg total) by mouth 3 (three) times daily for 7 days. 03/26/24 04/02/24 Yes StanhopeDorna HERO, FNP  nebivolol  (BYSTOLIC ) 5 MG tablet Take 1 tablet (5 mg total) by mouth daily. 10/19/23  Yes Vannie Reche RAMAN, NP  ascorbic acid (VITAMIN C) 500 MG tablet Take 500 mg by mouth daily.    [provider]  Multiple Vitamin (MULTIVITAMIN PO) Take 1 tablet by mouth daily.    [provider]  ondansetron  (ZOFRAN -ODT) 4 MG disintegrating tablet Take 1 tablet (4 mg total) by mouth every 8 (eight) hours as needed for nausea or vomiting. 02/05/24   Franklyn Sid SAILOR, MD    Family History Family History  Problem Relation Age of Onset   Rheum arthritis Mother    Bladder Cancer Mother    Kidney disease Father    Bladder Cancer Maternal Grandfather    Irritable bowel syndrome Paternal Grandmother    Colon cancer Neg Hx    Celiac disease Neg Hx    Inflammatory bowel disease Neg Hx     Social History Social History   Tobacco Use   Smoking status: Former    Current packs/day: 0.00    Types: Cigarettes    Quit date: 11/03/2019    Years since quitting: 4.3    Passive exposure: Past   Smokeless tobacco: Never   Tobacco comments:    nicoten gum  Vaping Use   Vaping status: Former   Quit date: 03/07/2019  Substance Use Topics   Alcohol use: No   Drug use: Yes    Types: Marijuana     Allergies   Nsaids   Review of Systems Review of Systems Per HPI\  Physical Exam Triage Vital Signs ED Triage Vitals [03/26/24 0825]  Encounter Vitals Group     BP (!) 143/82     Girls Systolic BP Percentile      Girls Diastolic BP Percentile      Boys Systolic BP Percentile      Boys Diastolic BP Percentile      Pulse Rate (!) 55     Resp 16     Temp 98.1 F (36.7 C)     Temp Source Oral      SpO2 98 %     Weight      Height      Head Circumference      Peak Flow      Pain Score 2     Pain Loc      Pain Education      Exclude from Growth Chart    No data found.  Updated Vital Signs BP (!) 143/82 (BP Location: Right Arm)   Pulse (!) 55   Temp 98.1 F (36.7 C) (Oral)   Resp 16   SpO2 98%   Visual Acuity Right Eye Distance:   Left Eye Distance:  Bilateral Distance:    Right Eye Near:   Left Eye Near:    Bilateral Near:     Physical Exam Vitals and nursing note reviewed. Exam conducted with a chaperone present.  Constitutional:      Appearance: He is not ill-appearing or toxic-appearing.  HENT:     Head: Normocephalic and atraumatic.     Right Ear: Hearing and external ear normal.     Left Ear: Hearing and external ear normal.     Nose: Nose normal.     Mouth/Throat:     Lips: Pink.   Eyes:     General: Lids are normal. Vision grossly intact. Gaze aligned appropriately.     Extraocular Movements: Extraocular movements intact.     Conjunctiva/sclera: Conjunctivae normal.   Pulmonary:     Effort: Pulmonary effort is normal.  Genitourinary:    Pubic Area: No rash.      Penis: Normal and circumcised. No phimosis, paraphimosis, hypospadias, erythema, tenderness, discharge, swelling or lesions.      Testes: Normal.     Comments: Delon, RN chaperone present. No lesions to the penis, scrotum, or groin. No lymphadenopathy to the bilateral groin.   Musculoskeletal:     Cervical back: Neck supple.  Lymphadenopathy:     Upper Body:     Right upper body: No axillary adenopathy.     Left upper body: Axillary adenopathy present.   Skin:    General: Skin is warm and dry.     Capillary Refill: Capillary refill takes less than 2 seconds.     Findings: Erythema present. No rash.         Comments: Bilateral axillary erythema and tenderness to palpation as seen in images below, more erythema to L than R.  No underlying palpable soft tissue swelling or  abscess.    Neurological:     General: No focal deficit present.     Mental Status: He is alert and oriented to person, place, and time. Mental status is at baseline.     Cranial Nerves: No dysarthria or facial asymmetry.   Psychiatric:        Mood and Affect: Mood normal.        Speech: Speech normal.        Behavior: Behavior normal.        Thought Content: Thought content normal.        Judgment: Judgment normal.    Right axilla   Left axilla    UC Treatments / Results  Labs (all labs ordered are listed, but only abnormal results are displayed) Labs Reviewed  RPR  HIV ANTIBODY (ROUTINE TESTING W REFLEX)  CYTOLOGY, (ORAL, ANAL, URETHRAL) ANCILLARY ONLY    EKG   Radiology No results found.  Procedures Procedures (including critical care time)  Medications Ordered in UC Medications - No data to display  Initial Impression / Assessment and Plan / UC Course  I have reviewed the triage vital signs and the nursing notes.  Pertinent labs & imaging results that were available during my care of the patient were reviewed by me and considered in my medical decision making (see chart for details).   1. Rash and nonspecific skin eruption Unclear cause of patient's rash, no breaks in the skin to cause cellulitis visible, however there is left axillary lymphadenopathy and erythema is associated with warmth/tenderness to palpation. Will treat for cellulitis with keflex 500mg  TID for 7 days. Infection return precautions discussed. Low suspicion for Herpes Zoster etc.  Advised  to return to UC or follow-up with PCP should rash fail to improve with antibiotics.  2. Screening for STD STI labs pending, will notify patient of positive results and treat accordingly per protocol when labs result.  Patient would like HIV and syphilis testing today.   Patient to avoid sexual intercourse until screening testing comes back.   Education provided regarding safe sexual practices and patient  encouraged to use protection to prevent spread of STIs.    Counseled patient on potential for adverse effects with medications prescribed/recommended today, strict ER and return-to-clinic precautions discussed, patient verbalized understanding.    Final Clinical Impressions(s) / UC Diagnoses   Final diagnoses:  Rash and nonspecific skin eruption  Screening for STD (sexually transmitted disease)     Discharge Instructions      It's unclear what is causing your rash, however I would like to treat for bacterial infection called cellulitis.  Take keflex antibiotic every 8 hours for the next 7 days. Take with a meal/snack. Take tylenol  as needed for pain associated with rash. If rash spreads significantly in the next 24-48 hours, or if rash does not improve while on antibiotic, please return to urgent care or PCP for re-evaluation.   STD testing pending, this will take 2-3 days to result. We will only call you if your testing is positive for any infection(s) and we will provide treatment.  Avoid sexual intercourse until your STD results come back.  If any of your STD results are positive, you will need to avoid sexual intercourse for 7 days while you are being treated to prevent spread of STD.  Condom use is the best way to prevent spread of STDs. Notify partner(s) of any positive results.  Return to urgent care as needed.       ED Prescriptions     Medication Sig Dispense Auth. Provider   cephALEXin (KEFLEX) 500 MG capsule Take 1 capsule (500 mg total) by mouth 3 (three) times daily for 7 days. 21 capsule Enedelia Dorna HERO, FNP      PDMP not reviewed this encounter.   Enedelia Dorna [place], [place] 03/26/24 [phone]

## 2024-03-26 NOTE — ED Triage Notes (Signed)
 Pt c/o of rash under his left underarm and buttocks, with burning in groin x 2 days. Pt states the rash is slightly itchy and burns. Pt states he would like to have STD testing while here.

## 2024-03-28 ENCOUNTER — Ambulatory Visit: Payer: 59

## 2024-03-28 ENCOUNTER — Telehealth: Payer: Self-pay | Admitting: Cardiology

## 2024-03-28 DIAGNOSIS — R0789 Other chest pain: Secondary | ICD-10-CM

## 2024-03-28 LAB — CYTOLOGY, (ORAL, ANAL, URETHRAL) ANCILLARY ONLY
Chlamydia: NEGATIVE
Comment: NEGATIVE
Comment: NEGATIVE
Comment: NORMAL
Neisseria Gonorrhea: NEGATIVE
Trichomonas: NEGATIVE

## 2024-03-28 NOTE — Telephone Encounter (Signed)
 Pt c/o of Chest Pain: STAT if active (IN THIS MOMENT) CP, including tightness, pressure, jaw pain, shoulder/upper arm/back pain, SOB, nausea, and vomiting.  1. Are you having CP right now (tightness, pressure, or discomfort)? No   2. Are you experiencing any other symptoms (ex. SOB, nausea, vomiting, sweating)? No   3. How long have you been experiencing CP? A few weeks  4. Is your CP continuous or coming and going? Coming and going   5. Have you taken Nitroglycerin ? No   6. If CP returns before callback, please consider calling 911. ?

## 2024-03-28 NOTE — Telephone Encounter (Signed)
 Pt called to report that he has been having exertional sharp chest pain for the past 2 weeks.. mainly after he exercises.. he has some dizziness with it... it shoots from his chest to his left thumb... he says he hs some GI issues going on as well.   I tried to make him an appt in [place] but as I ws scheduling fro tomorrow the opening was filled by a scheduler.   I will send to Dr Delford to see if he would like to have him worked in somewhere soon or plan for any testing... last cardiac CT 2022.   I will also send to [facility] to see if anything opens up that we can work him in.   Pt will plan to go the ED if anything worsens and not relieved... he will let us  know if anything changes.

## 2024-03-28 NOTE — Telephone Encounter (Signed)
 Patient informed and verbalized understanding of plan. Labs sent to [facility]  Sent to scheduling to call patient 1st if anything opens up.

## 2024-03-29 ENCOUNTER — Ambulatory Visit: Attending: Nurse Practitioner | Admitting: Nurse Practitioner

## 2024-03-29 ENCOUNTER — Encounter: Payer: Self-pay | Admitting: Nurse Practitioner

## 2024-03-29 ENCOUNTER — Telehealth: Payer: Self-pay | Admitting: Nurse Practitioner

## 2024-03-29 VITALS — BP 108/72 | HR 54 | Ht 71.0 in | Wt 170.8 lb

## 2024-03-29 DIAGNOSIS — R002 Palpitations: Secondary | ICD-10-CM

## 2024-03-29 DIAGNOSIS — R001 Bradycardia, unspecified: Secondary | ICD-10-CM | POA: Diagnosis not present

## 2024-03-29 DIAGNOSIS — Z87898 Personal history of other specified conditions: Secondary | ICD-10-CM | POA: Diagnosis not present

## 2024-03-29 DIAGNOSIS — I498 Other specified cardiac arrhythmias: Secondary | ICD-10-CM | POA: Diagnosis not present

## 2024-03-29 DIAGNOSIS — R42 Dizziness and giddiness: Secondary | ICD-10-CM

## 2024-03-29 DIAGNOSIS — R079 Chest pain, unspecified: Secondary | ICD-10-CM | POA: Diagnosis not present

## 2024-03-29 LAB — RPR: RPR Ser Ql: NONREACTIVE

## 2024-03-29 LAB — HIV ANTIBODY (ROUTINE TESTING W REFLEX): HIV Screen 4th Generation wRfx: NONREACTIVE

## 2024-03-29 NOTE — Patient Instructions (Addendum)
 Medication Instructions:  Your physician recommends that you continue on your current medications as directed. Please refer to the Current Medication list given to you today.  Labwork: Tomorrow at [facility]   Testing/Procedures: Your physician has requested that you have an exercise tolerance test. For further information please visit [email or web address] Please also follow instruction sheet, as given. Your physician has requested that you have an echocardiogram. Echocardiography is a painless test that uses sound waves to create images of your heart. It provides your doctor with information about the size and shape of your heart and how well your heart's chambers and valves are working. This procedure takes approximately one hour. There are no restrictions for this procedure. Please do NOT wear cologne, perfume, aftershave, or lotions (deodorant is allowed). Please arrive 15 minutes prior to your appointment time.  Please note: We ask at that you not bring children with you during ultrasound (echo/ vascular) testing. Due to room size and safety concerns, children are not allowed in the ultrasound rooms during exams. Our front office staff cannot provide observation of children in our lobby area while testing is being conducted. An adult accompanying a patient to their appointment will only be allowed in the ultrasound room at the discretion of the ultrasound technician under special circumstances. We apologize for any inconvenience.  Follow-Up: Your physician recommends that you schedule a follow-up appointment in: 6 weeks  Any Other Special Instructions Will Be Listed Below (If Applicable).  If you need a refill on your cardiac medications before your next appointment, please call your pharmacy.

## 2024-03-29 NOTE — Progress Notes (Unsigned)
 Cardiology Office Note:  .   Date:  03/29/2024 ID:  OWIN VIGNOLA, DOB Jul 21, 1981, MRN [id number] PCP: Cook, Jayce G, DO  [facility] Providers Cardiologist:  Maude Emmer, MD Electrophysiologist:  Will Gladis Norton, MD    History of Present Illness: .   Logan Benton is a 43 y.o. 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   Last seen by [name], NP on September 22, 2023 for follow-up. Reported intermittent chest discomfort, associated jaw pain at rest or activity. Was very active. Noted that patient was eating a healthy diet. CP considered atypical, past workup reassuring, no indication for ischemic evaluation. Was overall doing well on Nebivolol , 46 bpm in office. Pt requested dosage of Nebivolol  to be reduced to 2.5 mg daily via MyChart on 09/28/2024.  12/11/2023 - Today he presents for scheduled follow-up.  He states he is doing well. Continues to note intermittent chest discomfort, associated symptoms include palpitations while exercising. Doing well on current dose of nebivolol , tolerating well. Denies any shortness of breath, syncope, presyncope, dizziness, orthopnea, PND, swelling or significant weight changes, acute bleeding, or claudication.  03/29/2024 - Here for follow-up.  Admits to sensation of irregular heartbeat/pounding that tends to be painful.  Particularly noticed when laying in certain position, tends to have uncomfortable feeling along his chest, notices intermittent sensation of heart beating fast at times.  Does experience some new dizziness occasionally.  He is inquiring about genetic testing and also would like a full workup due to his most recent symptoms. Denies any shortness of breath, syncope, presyncope, orthopnea, PND, swelling or significant weight changes, acute bleeding, or claudication.  He tells me he has not seen electrophysiology in several years.   ROS: Negative. See  HPI.  FH: No known FH of CVD.   Studies Reviewed: SABRA    EKG: EKG Interpretation Date/Time:  Tuesday March 29 2024 15:08:18 EDT Ventricular Rate:  54 PR Interval:  128 QRS Duration:  114 QT Interval:  402 QTC Calculation: 381 R Axis:   65  Text Interpretation: Sinus bradycardia Right bundle branch block When compared with ECG of 11-Dec-2023 15:33, Right bundle branch block is now Present Confirmed by Miriam Norris [phone]) on 03/29/2024 3:16:31 PM   EKG Interpretation Date/Time:  Tuesday March 29 2024 15:08:18 EDT Ventricular Rate:  54 PR Interval:  128 QRS Duration:  114 QT Interval:  402 QTC Calculation: 381 R Axis:   65  Text Interpretation: Sinus bradycardia Right bundle branch block When compared with ECG of 11-Dec-2023 15:33, Right bundle branch block is now Present Confirmed by Miriam Norris [phone]) on 03/29/2024 3:16:31 PM   ETT 03/2022:    Excellent exercise capacity, achieved 17.2 METS   Peak heart rate 176 bpm (96% max age predicted HR)   No ST deviation was noted.   No evidence of ischemia  Echo 03/2022:  1. Left ventricular ejection fraction, by estimation, is 60 to 65%. The  left ventricle has normal function. The left ventricle has no regional  wall motion abnormalities. Left ventricular diastolic parameters were  normal.   2. Right ventricular systolic function is normal. The right ventricular  size is normal. There is normal pulmonary artery systolic pressure. The  estimated right ventricular systolic pressure is 25.7 mmHg.   3. The mitral valve is normal in structure. Trivial mitral valve  regurgitation.   4. The aortic valve is tricuspid. Aortic valve regurgitation is not  visualized. No  aortic stenosis is present.   5. The inferior vena cava is normal in size with greater than 50%  respiratory variability, suggesting right atrial pressure of 3 mmHg.   Comparison(s): No prior Echocardiogram.  CCTA 05/2021:  IMPRESSION: 1. Calcium score 0   2.  CAD RADS  0 normal right dominant coronary arteries   3.  Normal aortic root 2.8 cm  Cardiac monitor 01/2020:  Sinus rhythm, sinus arrhythmia, sinus bradycardia, and sinus tachycardia. Symptoms corresponded with all of the above.  Risk Assessment/Calculations:        The 10-year ASCVD risk score (Arnett DK, et al., 2019) is: 0.6%   Values used to calculate the score:     Age: 69 years     Clincally relevant sex: Male     Is Non-Hispanic African American: No     Diabetic: No     Tobacco smoker: No     Systolic Blood Pressure: 156 mmHg     Is BP treated: Yes     HDL Cholesterol: 85 mg/dL     Total Cholesterol: 163 mg/dL  Physical Exam:   VS:  BP 108/72   Pulse (!) 54   Ht 5' 11 (1.803 m)   Wt 170 lb 12.8 oz (77.5 kg)   SpO2 96%   BMI 23.82 kg/m    Wt Readings from Last 3 Encounters:  03/29/24 170 lb 12.8 oz (77.5 kg)  02/05/24 160 lb (72.6 kg)  12/11/23 162 lb (73.5 kg)    GEN: Well nourished, well developed in no acute distress NECK: No JVD; No carotid bruits CARDIAC: S1/S2, slow rate and regular rhythm, no murmurs, rubs, gallops RESPIRATORY:  Clear to auscultation without rales, wheezing or rhonchi  ABDOMEN: Soft, non-tender, non-distended EXTREMITIES:  No edema; No deformity   ASSESSMENT AND PLAN: .    Chest pain of uncertain etiology Continues to note intermittent chest discomfort, noticed with his irregular heart beats. Did discuss ETT and risks vs benefits, and he verbalizes understanding and is agreeable to proceed. Will also update Echo at this time. Reassuring ETT in 2023. No medication changes at this time. Care and ED precautions discussed.  Informed Consent   Shared Decision Making/Informed Consent The risks [chest pain, shortness of breath, cardiac arrhythmias, dizziness, blood pressure fluctuations, myocardial infarction, stroke/transient ischemic attack, and life-threatening complications (estimated to be 1 in 10,000)], benefits (risk stratification, diagnosing  coronary artery disease, treatment guidance) and alternatives of an exercise tolerance test were discussed in detail with Logan Benton and he agrees to proceed.     2. Brugada type 1 pattern, hx of syncope, bradycardia  Followed by EP, currently has ILR. Denies any recurrent syncope. Most recent ILR report revealed no arrhythmias that were seen. Continue nebivolol . Will place referral to see EP here locally. Continue to follow-up with EP. Care and ED precautions discussed.   3. Palpitations, dizziness Etiology unclear, seems to be associated with his chest discomfort. Denies any orthostatic symptoms. Arranging Echo in addition to ETT as noted above. Also will obtain Mag and CMET. Recent ILR report reassuring. Will refer to EP. Will consult EP about genetic testing regarding his Brugada pattern.    Dispo: Care and ED precautions discussed. Follow-up with Dr. Delford or APP in 6 weeks or sooner if anything changes.   Signed, [name], NP

## 2024-03-29 NOTE — Telephone Encounter (Signed)
 Checking percert on the following patient for testing scheduled at [facility].     GXT   04/05/2024

## 2024-03-30 ENCOUNTER — Other Ambulatory Visit (HOSPITAL_COMMUNITY)
Admission: RE | Admit: 2024-03-30 | Discharge: 2024-03-30 | Disposition: A | Discharge status: Home / Self Care | Source: Ambulatory Visit | Attending: Internal Medicine | Admitting: Internal Medicine

## 2024-03-30 ENCOUNTER — Telehealth: Payer: Self-pay

## 2024-03-30 ENCOUNTER — Encounter: Payer: Self-pay | Admitting: Cardiology

## 2024-03-30 ENCOUNTER — Telehealth (HOSPITAL_COMMUNITY): Payer: Self-pay

## 2024-03-30 ENCOUNTER — Ambulatory Visit
Admission: EM | Admit: 2024-03-30 | Discharge: 2024-03-30 | Disposition: A | Discharge status: Home / Self Care | Attending: Nurse Practitioner | Admitting: Nurse Practitioner

## 2024-03-30 ENCOUNTER — Other Ambulatory Visit (HOSPITAL_COMMUNITY)
Admission: RE | Admit: 2024-03-30 | Discharge: 2024-03-30 | Disposition: A | Discharge status: Home / Self Care | Source: Ambulatory Visit | Attending: Nurse Practitioner | Admitting: Nurse Practitioner

## 2024-03-30 DIAGNOSIS — R21 Rash and other nonspecific skin eruption: Secondary | ICD-10-CM | POA: Diagnosis not present

## 2024-03-30 DIAGNOSIS — R0789 Other chest pain: Secondary | ICD-10-CM | POA: Insufficient documentation

## 2024-03-30 DIAGNOSIS — R079 Chest pain, unspecified: Secondary | ICD-10-CM | POA: Diagnosis present

## 2024-03-30 LAB — COMPREHENSIVE METABOLIC PANEL WITH GFR
ALT: 31 U/L (ref 0–44)
AST: 25 U/L (ref 15–41)
Albumin: 4.1 g/dL (ref 3.5–5.0)
Alkaline Phosphatase: 47 U/L (ref 38–126)
Anion gap: 11 (ref 5–15)
BUN: 25 mg/dL — ABNORMAL HIGH (ref 6–20)
CO2: 22 mmol/L (ref 22–32)
Calcium: 9.2 mg/dL (ref 8.9–10.3)
Chloride: 104 mmol/L (ref 98–111)
Creatinine, Ser: 0.99 mg/dL (ref 0.61–1.24)
GFR, Estimated: >60 mL/min (ref >60)
Glucose, Bld: 104 mg/dL — ABNORMAL HIGH (ref 70–99)
Potassium: 3.9 mmol/L (ref 3.5–5.1)
Sodium: 137 mmol/L (ref 135–145)
Total Bilirubin: 0.7 mg/dL (ref 0.0–1.2)
Total Protein: 7.2 g/dL (ref 6.5–8.1)

## 2024-03-30 LAB — C-REACTIVE PROTEIN: CRP: 0.7 mg/dL (ref <1.0)

## 2024-03-30 LAB — SEDIMENTATION RATE: Sed Rate: 1 mm/h (ref 0–15)

## 2024-03-30 LAB — MAGNESIUM: Magnesium: 2.2 mg/dL (ref 1.7–2.4)

## 2024-03-30 LAB — D-DIMER, QUANTITATIVE: D-Dimer, Quant: 0.35 ug{FEU}/mL (ref 0.00–0.50)

## 2024-03-30 MED ORDER — TRIAMCINOLONE ACETONIDE 0.1 % EX OINT: 1.0000 | TOPICAL_OINTMENT | Freq: Two times a day (BID) | CUTANEOUS | 0 refills | Status: DC

## 2024-03-30 MED ORDER — MICONAZOLE NITRATE 2 % EX CREA: 1.0000 | TOPICAL_CREAM | Freq: Two times a day (BID) | CUTANEOUS | 0 refills | Status: DC

## 2024-03-30 NOTE — Telephone Encounter (Signed)
 Pt called stating that the rash he was seen for on 6/21 was not getting better even on antibiotics. After speaking with provider, pt should return for further evaluation. Pt verbalized understanding.

## 2024-03-30 NOTE — ED Triage Notes (Signed)
 Pt reports he still has a rash under his underarms and buttock x 6 days

## 2024-03-30 NOTE — Telephone Encounter (Signed)
 Patient called call back RN and requesting change in therapy to treat rash from recent urgent care visit. Rash to the bilateral axilla is not improving with use of keflex antibiotic as prescribed at visit on 03/26/2024. Recommend use of miconazole cream BID for 7-14 days to rash and follow-up with PCP in the next few days for re-check.

## 2024-03-30 NOTE — ED Provider Notes (Signed)
 [facility]    CSN: [id number] Arrival date & time: 03/30/24  1553      History   Chief Complaint Chief Complaint  Patient presents with   Rash    HPI Logan Benton is a 43 y.o. male.   Patient presents today for rash to left underarm and buttock area.  Reports rashes have been ongoing for the past 6 days.  Left underarm rash is slightly raised, red, itchy.  Eating that is getting worse since he was seen here a few days ago and started on Keflex.  The rash on his buttock is itchy but does not know if it is raised or red.  No drainage from the rashes.  No fevers or nausea/vomiting.  Has been taking the Keflex as prescribed.     Past Medical History:  Diagnosis Date   Abscessed tooth    Anxiety    C. difficile diarrhea    presumptive diagnosis   Hypertension    Trigger finger     Patient Active Problem List   Diagnosis Date Noted   Chronic fatigue 06/28/2023   Neck pain 03/02/2023   Near syncope 03/23/2022   Insomnia 03/21/2021   IBS (irritable bowel syndrome) 02/27/2021   Primary hypertension 02/26/2021   Barrett's esophagus 12/31/2020    Past Surgical History:  Procedure Laterality Date   BILATERAL CARPAL TUNNEL RELEASE     BIOPSY  06/29/2020   Procedure: BIOPSY;  Surgeon: Cindie Carlin POUR, DO;  [facility];  Service: Endoscopy;;   BIOPSY  09/03/2020   Procedure: BIOPSY;  Surgeon: Cindie Carlin POUR, DO;  [facility];  Service: Endoscopy;;   BIOPSY  03/08/2021   Procedure: BIOPSY;  Surgeon: Cindie Carlin POUR, DO;  [facility];  Service: Endoscopy;;   BIOPSY  05/10/2021   Procedure: BIOPSY;  Surgeon: Cindie Carlin POUR, DO;  [facility];  Service: Endoscopy;;   CHOLECYSTECTOMY N/A 04/25/2022   Procedure: LAPAROSCOPIC CHOLECYSTECTOMY;  Surgeon: Kallie Manuelita BROCKS, MD;  Location: [facility];  Service: General;  Laterality: N/A;   COLONOSCOPY WITH PROPOFOL  N/A 06/29/2020   Dr. Cindie: 4 mm tubular adenoma  removed, nonbleeding internal hemorrhoids.  Repeat colonoscopy in 7 years.   COLONOSCOPY WITH PROPOFOL  N/A 05/10/2021   Procedure: COLONOSCOPY WITH PROPOFOL ;  Surgeon: Cindie Carlin POUR, DO;  [facility];  Service: Endoscopy;  Laterality: N/A;  1:15pm   ESOPHAGOGASTRODUODENOSCOPY (EGD) WITH PROPOFOL  N/A 06/29/2020   Dr. Cindie: LA grade C esophagitis, gastritis with mild chronic gastritis on path, small bowel biopsy unremarkable.   ESOPHAGOGASTRODUODENOSCOPY (EGD) WITH PROPOFOL  N/A 09/03/2020   Dr. Cindie: Esophageal mucosal changes suggestive of short segment Barrett's esophagus, biopsies consistent.  Next EGD in 3 years   ESOPHAGOGASTRODUODENOSCOPY (EGD) WITH PROPOFOL  N/A 03/08/2021   Procedure: ESOPHAGOGASTRODUODENOSCOPY (EGD) WITH PROPOFOL ;  Surgeon: Cindie Carlin POUR, DO;  [facility];  Service: Endoscopy;  Laterality: N/A;  1:00pm   LOOP RECORDER INSERTION N/A 03/26/2022   Procedure: LOOP RECORDER INSERTION;  Surgeon: Inocencio Soyla Lunger, MD;  [facility]: [facility];  Service: Cardiovascular;  Laterality: N/A;   MASS EXCISION Right 04/25/2022   Procedure: EXCISION MASS UPPER RIGHT ABDOMEN;  Surgeon: Kallie Manuelita BROCKS, MD;  Location: [facility];  Service: General;  Laterality: Right;   POLYPECTOMY  06/29/2020   Procedure: POLYPECTOMY;  Surgeon: Cindie Carlin POUR, DO;  [facility];  Service: Endoscopy;;   SHOULDER SURGERY Right    x2  TRIGGER FINGER RELEASE Right 2016       Home Medications    Prior to Admission medications   Medication Sig Start Date End Date Taking? Authorizing Provider  triamcinolone ointment (KENALOG) 0.1 % Apply 1 Application topically 2 (two) times daily. 03/30/24  Yes Chandra Raisin A, NP  ascorbic acid (VITAMIN C) 500 MG tablet Take 500 mg by mouth daily.    [provider]  cephALEXin (KEFLEX) 500 MG capsule Take 1 capsule (500 mg total) by mouth 3 (three) times daily for 7 days. 03/26/24 04/02/24  Enedelia Dorna HERO, FNP  miconazole (MICOTIN) 2 % cream Apply 1 Application topically 2 (two) times daily. 03/30/24   Chandra Raisin LABOR, NP  Multiple Vitamin (MULTIVITAMIN PO) Take 1 tablet by mouth daily.    [provider]  nebivolol  (BYSTOLIC ) 5 MG tablet Take 1 tablet (5 mg total) by mouth daily. 10/19/23   Walker, Caitlin S, NP  ondansetron  (ZOFRAN -ODT) 4 MG disintegrating tablet Take 1 tablet (4 mg total) by mouth every 8 (eight) hours as needed for nausea or vomiting. 02/05/24   Franklyn Sid SAILOR, MD    Family History Family History  Problem Relation Age of Onset   Rheum arthritis Mother    Bladder Cancer Mother    Kidney disease Father    Bladder Cancer Maternal Grandfather    Irritable bowel syndrome Paternal Grandmother    Anxiety disorder Paternal Grandmother    Alcohol abuse Paternal Grandfather    Colon cancer Neg Hx    Celiac disease Neg Hx    Inflammatory bowel disease Neg Hx     Social History Social History   Tobacco Use   Smoking status: Former    Current packs/day: 0.00    Types: Cigarettes    Quit date: 11/03/2019    Years since quitting: 4.4    Passive exposure: Past   Smokeless tobacco: Never   Tobacco comments:    nicoten gum  Vaping Use   Vaping status: Former   Quit date: 03/07/2019  Substance Use Topics   Alcohol use: No   Drug use: Yes    Types: Marijuana     Allergies   Nsaids   Review of Systems Review of Systems Per HPI  Physical Exam Triage Vital Signs ED Triage Vitals [03/30/24 1602]  Encounter Vitals Group     BP (!) 156/85     Girls Systolic BP Percentile      Girls Diastolic BP Percentile      Boys Systolic BP Percentile      Boys Diastolic BP Percentile      Pulse Rate 61     Resp 18     Temp 98.1 F (36.7 C)     Temp Source Oral     SpO2 96 %     Weight      Height      Head Circumference      Peak Flow      Pain Score      Pain Loc      Pain Education      Exclude from Growth Chart    No data  found.  Updated Vital Signs BP (!) 156/85 (BP Location: Right Arm)   Pulse 61   Temp 98.1 F (36.7 C) (Oral)   Resp 18   SpO2 96%   Visual Acuity Right Eye Distance:   Left Eye Distance:   Bilateral Distance:    Right Eye Near:   Left Eye Near:  Bilateral Near:     Physical Exam Vitals and nursing note reviewed.  Constitutional:      General: He is not in acute distress.    Appearance: Normal appearance. He is not toxic-appearing.  HENT:     Head: Normocephalic and atraumatic.     Mouth/Throat:     Mouth: Mucous membranes are moist.  Pulmonary:     Effort: Pulmonary effort is normal. No respiratory distress.   Skin:    General: Skin is warm and dry.     Capillary Refill: Capillary refill takes less than 2 seconds.     Coloration: Skin is not jaundiced or pale.     Findings: Rash present.     Comments: Raised, slightly erythematous, flaky, irregularly shaped rash left underarm; there is no drainage, odor.  Rough skin noted to intergluteal folds.  No erythema drainage, odor, or obvious rash.   Neurological:     Mental Status: He is alert and oriented to person, place, and time.   Psychiatric:        Behavior: Behavior is cooperative.      UC Treatments / Results  Labs (all labs ordered are listed, but only abnormal results are displayed) Labs Reviewed - No data to display  EKG   Radiology No results found.  Procedures Procedures (including critical care time)  Medications Ordered in UC Medications - No data to display  Initial Impression / Assessment and Plan / UC Course  I have reviewed the triage vital signs and the nursing notes.  Pertinent labs & imaging results that were available during my care of the patient were reviewed by me and considered in my medical decision making (see chart for details).   Patient is mildly hypertensive in triage today, otherwise vital signs are stable.  1. Rash and nonspecific skin eruption Suspect left  underarm rash is due to deodorant, recommended discontinuing use of that deodorant and trying a different type Start triamcinolone ointment twice daily for no more than 7 days, continue Keflex  Unclear cause of rash to intergluteal fold, although fungal rash is possibility Treat with miconazole cream Recommended follow-up with dermatology if symptoms do not improve with treatment  The patient was given the opportunity to ask questions.  All questions answered to their satisfaction.  The patient is in agreement to this plan.  Final Clinical Impressions(s) / UC Diagnoses   Final diagnoses:  Rash and nonspecific skin eruption     Discharge Instructions      Apply the Kenalog ointment to the left underarm twice daily as needed to help with the rash.  Apply the Micotin cream to the area around your buttocks twice daily as well to help with any possible fungal rash.  Continue to keep both areas clean and dry.   Recommend follow up with Dermatology if symptoms do not improve with treatment.   ED Prescriptions     Medication Sig Dispense Auth. Provider   triamcinolone ointment (KENALOG) 0.1 % Apply 1 Application topically 2 (two) times daily. 15 g Chandra Raisin A, NP   miconazole (MICOTIN) 2 % cream Apply 1 Application topically 2 (two) times daily. 14 g Chandra Raisin LABOR, NP      PDMP not reviewed this encounter.   Chandra Raisin LABOR, NP 03/30/24 [phone]

## 2024-03-30 NOTE — Telephone Encounter (Signed)
 Pt left voicemail regarding visit form 6/21. Stated rash is not improving on abx and is concerned may be fungal. Requesting call from nurse or provider.   Please advise with what to convey to pt.  Thanks.

## 2024-03-30 NOTE — Discharge Instructions (Signed)
 Apply the Kenalog ointment to the left underarm twice daily as needed to help with the rash.  Apply the Micotin cream to the area around your buttocks twice daily as well to help with any possible fungal rash.  Continue to keep both areas clean and dry.   Recommend follow up with Dermatology if symptoms do not improve with treatment.

## 2024-03-31 ENCOUNTER — Encounter: Payer: Self-pay | Admitting: Cardiovascular Disease

## 2024-04-01 ENCOUNTER — Ambulatory Visit: Payer: Self-pay | Admitting: Nurse Practitioner

## 2024-04-04 ENCOUNTER — Ambulatory Visit (INDEPENDENT_AMBULATORY_CARE_PROVIDER_SITE_OTHER): Payer: Self-pay

## 2024-04-04 DIAGNOSIS — R001 Bradycardia, unspecified: Secondary | ICD-10-CM | POA: Diagnosis not present

## 2024-04-04 LAB — CUP PACEART REMOTE DEVICE CHECK
Date Time Interrogation Session: 20250629002300
Implantable Pulse Generator Implant Date: 20230621
Pulse Gen Serial Number: 177683

## 2024-04-05 ENCOUNTER — Ambulatory Visit: Payer: Self-pay | Admitting: Internal Medicine

## 2024-04-05 ENCOUNTER — Ambulatory Visit (HOSPITAL_COMMUNITY)
Admission: RE | Admit: 2024-04-05 | Discharge: 2024-04-05 | Disposition: A | Discharge status: Home / Self Care | Source: Ambulatory Visit | Attending: Nurse Practitioner | Admitting: Nurse Practitioner

## 2024-04-05 ENCOUNTER — Other Ambulatory Visit: Payer: Self-pay | Admitting: Physician Assistant

## 2024-04-05 DIAGNOSIS — R079 Chest pain, unspecified: Secondary | ICD-10-CM | POA: Insufficient documentation

## 2024-04-05 LAB — EXERCISE TOLERANCE TEST
Angina Index: 0
Duke Treadmill Score: 14
Estimated workload: 20
Exercise duration (min): 13 min
Exercise duration (sec): 38 s
MPHR: 180 {beats}/min
Peak HR: 173 {beats}/min
Percent HR: 96 %
RPE: 14
Rest HR: 72 {beats}/min
ST Depression (mm): 0 mm

## 2024-04-05 NOTE — Progress Notes (Signed)
     Logan Benton presented for a Exercise stress test today.  I Lorette CINDERELLA Kapur, PA-C, provided direct supervision and was present during the stress portion of the study today, which was completed without significant symptoms, immediate complications, or acute ST/T changes on ECG.  No Stress imaging is pending at this time.  Preliminary ECG findings may be listed in the chart.   Lorette CINDERELLA Kapur, PA-C  04/05/2024, 4:17 PM

## 2024-04-06 ENCOUNTER — Encounter: Payer: Self-pay | Admitting: Cardiology

## 2024-04-11 ENCOUNTER — Ambulatory Visit: Payer: Self-pay | Admitting: Cardiology

## 2024-04-11 ENCOUNTER — Ambulatory Visit: Payer: Self-pay

## 2024-04-12 ENCOUNTER — Telehealth: Payer: Self-pay | Admitting: Internal Medicine

## 2024-04-12 NOTE — Telephone Encounter (Signed)
 Called Mr. Andreatta this morning offering him an upcoming appointment with [name], NP for the [facility] office. He stated that he would not be able To take that appointment. Advised patient if he changed his mind please feel free to contact the office.

## 2024-04-18 ENCOUNTER — Ambulatory Visit: Payer: 59

## 2024-04-19 NOTE — Progress Notes (Unsigned)
 Electrophysiology Office Note:   Date:  04/20/2024  ID:  Logan Benton, DOB 1981/08/28, MRN [id number]  Primary Cardiologist: Maude Emmer, MD Primary Heart Failure: None Electrophysiologist: Fabian Walder Gladis Norton, MD      History of Present Illness:   Logan Benton is a 43 y.o. male with h/o hypertension, anxiety seen today for routine electrophysiology followup.   Admitted to the hospital after an episode of syncope.  He was found to have an abnormal EKG.  He had an exercise treadmill test while in the hospital and had no syncopal symptoms.  His syncope occurred during exercise.  He had an ILR implanted at that time.  Since last being seen in our clinic the patient reports doing overall well.  He has no chest pain or shortness of breath.  He did have 1 episode of tachycardia around the time of exercise.  Aside from that, he has no further issues..  he denies chest pain, palpitations, dyspnea, PND, orthopnea, nausea, vomiting, dizziness, syncope, edema, weight gain, or early satiety.   Review of systems complete and found to be negative unless listed in HPI.   Device History: Medtronic loop recorder implanted 03/26/2022 for syncope  EP Information / Studies Reviewed:    EKG is not ordered today. EKG from 11/30/23 reviewed which showed sinus rhythm, right bundle branch block        Risk Assessment/Calculations:          Physical Exam:   VS:  BP 132/70 (BP Location: Right Arm, Patient Position: Sitting, Cuff Size: Large)   Pulse (!) 54   Ht 5' 11 (1.803 m)   Wt 173 lb 4.8 oz (78.6 kg)   SpO2 98%   BMI 24.17 kg/m    Wt Readings from Last 3 Encounters:  04/20/24 173 lb 4.8 oz (78.6 kg)  03/29/24 170 lb 12.8 oz (77.5 kg)  02/05/24 160 lb (72.6 kg)     GEN: Well nourished, well developed in no acute distress NECK: No JVD; No carotid bruits CARDIAC: Regular rate and rhythm, no murmurs, rubs, gallops RESPIRATORY:  Clear to auscultation without rales, wheezing or  rhonchi  ABDOMEN: Soft, non-tender, non-distended EXTREMITIES:  No edema; No deformity   ILR Interrogation- reviewed in detail today,  See PACEART report  ASSESSMENT AND PLAN:    Syncope s/p Medtronic Loop recorder Normal device function See Pace Art report No changes today  2.  Brugada pattern EKG: He is quite concerned over his Brugada pattern.  He does not have any family history or other high risk features.  At this point, he would prefer EP study to look for ventricular arrhythmias.  If this is positive, we Jovanna Hodges plan for S ICD implant.  Risks and benefits have been discussed.  Risk of the bleeding, tamponade, infection.  The patient understands the risks and is agreed to the procedure.  3.  SVT: Had an episode of SVT while exercising.  Heart rates were above 200 bpm.  He has only had this during exercise.  Quame Spratlin plan for EP study as above.  If this is positive, we Kaegan Stigler plan for ablation.  Risks and benefits have been discussed.    Risk, benefits, and alternatives to EP study and radiofrequency for SVT were also discussed in detail today. These risks include but are not limited to stroke, bleeding, vascular damage, tamponade, perforation, damage to the esophagus, lungs, and other structures, pulmonary vein stenosis, worsening renal function, and death. The patient understands these risk and wishes to  proceed.  We Lupita Rosales therefore proceed with catheter ablation at the next available time.       Follow up with Dr. Inocencio as usual post procedure  Signed, Thula Stewart Gladis Inocencio, MD

## 2024-04-20 ENCOUNTER — Ambulatory Visit: Attending: Cardiology | Admitting: Cardiology

## 2024-04-20 ENCOUNTER — Encounter: Payer: Self-pay | Admitting: Cardiology

## 2024-04-20 VITALS — BP 132/70 | HR 54 | Ht 71.0 in | Wt 173.3 lb

## 2024-04-20 DIAGNOSIS — I498 Other specified cardiac arrhythmias: Secondary | ICD-10-CM | POA: Diagnosis not present

## 2024-04-20 DIAGNOSIS — I471 Supraventricular tachycardia, unspecified: Secondary | ICD-10-CM | POA: Diagnosis not present

## 2024-04-20 NOTE — Patient Instructions (Signed)
 Medication Instructions:  Your physician recommends that you continue on your current medications as directed. Please refer to the Current Medication list given to you today.  *If you need a refill on your cardiac medications before your next appointment, please call your pharmacy*  Lab Work: None ordered  If you have any lab test that is abnormal or we need to change your treatment, we will call you to review the results.  Testing/Procedures: Your physician has recommended that you have an ablation. Catheter ablation is a medical procedure used to treat some cardiac arrhythmias (irregular heartbeats). During catheter ablation, a long, thin, flexible tube is put into a blood vessel in your groin (upper thigh), or neck. This tube is called an ablation catheter. It is then guided to your heart through the blood vessel. Radio frequency waves destroy small areas of heart tissue where abnormal heartbeats may cause an arrhythmia to start.  The office will call you to schedule this in October, once that schedule is open   Follow-Up: At [facility], you and your health needs are our priority.  As part of our continuing mission to provide you with exceptional heart care, our providers are all part of one team.  This team includes your primary Cardiologist (physician) and Advanced Practice Providers or APPs (Physician Assistants and Nurse Practitioners) who all work together to provide you with the care you need, when you need it.  Your next appointment:   To be determined  Provider:   Soyla Norton, MD     Thank you for choosing [facility]!!   Maeola Domino, RN [phone]   Other Instructions

## 2024-04-21 ENCOUNTER — Other Ambulatory Visit

## 2024-04-22 NOTE — Progress Notes (Signed)
 Carelink Summary Report / Loop Recorder

## 2024-04-22 NOTE — Addendum Note (Signed)
 Addended by: VICCI SELLER A on: 04/22/2024 10:35 AM   Modules accepted: Orders

## 2024-04-28 ENCOUNTER — Ambulatory Visit: Payer: 59

## 2024-04-29 ENCOUNTER — Ambulatory Visit: Payer: 59

## 2024-05-05 ENCOUNTER — Ambulatory Visit: Payer: Self-pay

## 2024-05-10 ENCOUNTER — Encounter: Payer: Self-pay | Admitting: Nurse Practitioner

## 2024-05-10 ENCOUNTER — Ambulatory Visit: Attending: Nurse Practitioner | Admitting: Nurse Practitioner

## 2024-05-10 VITALS — BP 118/72 | HR 59 | Ht 71.0 in | Wt 174.0 lb

## 2024-05-10 DIAGNOSIS — I471 Supraventricular tachycardia, unspecified: Secondary | ICD-10-CM | POA: Diagnosis not present

## 2024-05-10 DIAGNOSIS — R001 Bradycardia, unspecified: Secondary | ICD-10-CM | POA: Diagnosis not present

## 2024-05-10 DIAGNOSIS — I498 Other specified cardiac arrhythmias: Secondary | ICD-10-CM

## 2024-05-10 DIAGNOSIS — Z87898 Personal history of other specified conditions: Secondary | ICD-10-CM | POA: Diagnosis not present

## 2024-05-10 NOTE — Patient Instructions (Addendum)

## 2024-05-10 NOTE — Progress Notes (Signed)
 Cardiology Office Note:  .   Date: 05/10/2024 ID:  Logan Benton, DOB 08-23-1981, MRN [id number] PCP: Cook, Jayce G, DO  [name] Providers Cardiologist:  Maude Emmer, MD Electrophysiologist:  Will Gladis Norton, MD    History of Present Illness: .   Logan Benton is a 43 y.o. male with a PMH of syncope,chest pain, HTN, Barrett's esophagus, incomplete RBBB, questionable Brugada, s/p ILR, who presents today for scheduled follow-up.   Last seen by [name], NP on September 22, 2023 for follow-up. Reported intermittent chest discomfort, associated jaw pain at rest or activity. Was very active. Noted that patient was eating a healthy diet. CP considered atypical, past workup reassuring, no indication for ischemic evaluation. Was overall doing well on Nebivolol , 46 bpm in office. Pt requested dosage of Nebivolol  to be reduced to 2.5 mg daily via MyChart on 09/28/2024.  12/11/2023 - Today he presents for scheduled follow-up.  He states he is doing well. Continues to note intermittent chest discomfort, associated symptoms include palpitations while exercising. Doing well on current dose of nebivolol , tolerating well. Denies any shortness of breath, syncope, presyncope, dizziness, orthopnea, PND, swelling or significant weight changes, acute bleeding, or claudication.  03/29/2024 - Here for follow-up.  Admits to sensation of irregular heartbeat/pounding that tends to be painful.  Particularly noticed when laying in certain position, tends to have uncomfortable feeling along his chest, notices intermittent sensation of heart beating fast at times.  Does experience some new dizziness occasionally.  He is inquiring about genetic testing and also would like a full workup due to his most recent symptoms. Denies any shortness of breath, syncope, presyncope, orthopnea, PND, swelling or significant weight changes, acute bleeding, or claudication.  He tells me he has not seen electrophysiology in  several years.  05/10/2024 - Here for follow-up. Doing well and denies any acute cardiac complaints or issues. Denies any chest pain, shortness of breath, palpitations, syncope, presyncope, dizziness, orthopnea, PND, swelling or significant weight changes, acute bleeding, or claudication. Being followed by EP.    ROS: Negative. See HPI.  FH: No known FH of CVD.   Studies Reviewed: SABRA    EKG:   EKG is not ordered today.  ETT 04/2024:    Exercise capacity was excellent. Patient exercised for 13 min and 38 sec. Maximum HR of 173 bpm. MPHR 96.0%. Peak METS 20.0. The patient experienced no angina during the test. Hypertensive blood pressure response noted during stress.   No ST deviation was noted. The ECG was negative for ischemia. Low risk Duke treadmill score of 14.  ETT 03/2022:    Excellent exercise capacity, achieved 17.2 METS   Peak heart rate 176 bpm (96% max age predicted HR)   No ST deviation was noted.   No evidence of ischemia  Echo 03/2022:  1. Left ventricular ejection fraction, by estimation, is 60 to 65%. The  left ventricle has normal function. The left ventricle has no regional  wall motion abnormalities. Left ventricular diastolic parameters were  normal.   2. Right ventricular systolic function is normal. The right ventricular  size is normal. There is normal pulmonary artery systolic pressure. The  estimated right ventricular systolic pressure is 25.7 mmHg.   3. The mitral valve is normal in structure. Trivial mitral valve  regurgitation.   4. The aortic valve is tricuspid. Aortic valve regurgitation is not  visualized. No aortic stenosis is present.   5. The inferior vena cava is normal in size with greater  than 50%  respiratory variability, suggesting right atrial pressure of 3 mmHg.   Comparison(s): No prior Echocardiogram.  CCTA 05/2021:  IMPRESSION: 1. Calcium score 0   2.  CAD RADS 0 normal right dominant coronary arteries   3.  Normal aortic root 2.8  cm  Cardiac monitor 01/2020:  Sinus rhythm, sinus arrhythmia, sinus bradycardia, and sinus tachycardia. Symptoms corresponded with all of the above.  Risk Assessment/Calculations:        The 10-year ASCVD risk score (Arnett DK, et al., 2019) is: 0.3%   Values used to calculate the score:     Age: 88 years     Clincally relevant sex: Male     Is Non-Hispanic African American: No     Diabetic: No     Tobacco smoker: No     Systolic Blood Pressure: 118 mmHg     Is BP treated: Yes     HDL Cholesterol: 85 mg/dL     Total Cholesterol: 163 mg/dL  Physical Exam:   VS:  BP 118/72   Pulse (!) 59   Ht 5' 11 (1.803 m)   Wt 174 lb (78.9 kg)   SpO2 97%   BMI 24.27 kg/m    Wt Readings from Last 3 Encounters:  05/10/24 174 lb (78.9 kg)  04/20/24 173 lb 4.8 oz (78.6 kg)  03/29/24 170 lb 12.8 oz (77.5 kg)    GEN: Well nourished, well developed in no acute distress NECK: No JVD; No carotid bruits CARDIAC: S1/S2, slow rate and regular rhythm, no murmurs, rubs, gallops RESPIRATORY:  Clear to auscultation without rales, wheezing or rhonchi  ABDOMEN: Soft, non-tender, non-distended EXTREMITIES:  No edema; No deformity   ASSESSMENT AND PLAN: .    1. Brugada type 1 pattern, hx of syncope, bradycardia  Followed by EP, currently has ILR. Denies any recurrent syncope. Most recent ILR report revealed no arrhythmias that were seen. Continue nebivolol . Continue to follow-up with EP. Care and ED precautions discussed.   2. SVT Past history of SVT episode while exercising.  He is being followed by Dr. Inocencio who is arranging EP study.  It was noted that Dr. Inocencio stated if this was positive, would plan for ablation.  Continue follow-up with EP.   Dispo: Care and ED precautions discussed. Follow-up with Dr. Delford or APP in 1 year or sooner if anything changes.   I spent a total duration of 20 minutes reviewing prior notes, reviewing outside records including  labs, face-to-face counseling of  medical condition, pathophysiology, evaluation, management, and documenting the findings in the note.   Signed, [name], NP

## 2024-05-11 ENCOUNTER — Ambulatory Visit (INDEPENDENT_AMBULATORY_CARE_PROVIDER_SITE_OTHER): Payer: Self-pay

## 2024-05-11 DIAGNOSIS — R001 Bradycardia, unspecified: Secondary | ICD-10-CM | POA: Diagnosis not present

## 2024-05-11 LAB — CUP PACEART REMOTE DEVICE CHECK
Date Time Interrogation Session: 20250805162300
Implantable Pulse Generator Implant Date: 20230621
Pulse Gen Serial Number: 177683

## 2024-05-13 ENCOUNTER — Ambulatory Visit (HOSPITAL_COMMUNITY)
Admission: RE | Admit: 2024-05-13 | Discharge: 2024-05-13 | Disposition: A | Discharge status: Home / Self Care | Source: Ambulatory Visit | Attending: Nurse Practitioner | Admitting: Nurse Practitioner

## 2024-05-13 DIAGNOSIS — R079 Chest pain, unspecified: Secondary | ICD-10-CM | POA: Insufficient documentation

## 2024-05-13 LAB — ECHOCARDIOGRAM COMPLETE
AR max vel: 4.51 cm2
AV Area VTI: 4 cm2
AV Area mean vel: 4.03 cm2
AV Mean grad: 3 mmHg
AV Peak grad: 5.1 mmHg
Ao pk vel: 1.13 m/s
Area-P 1/2: 2.05 cm2
S' Lateral: 3.3 cm

## 2024-05-13 NOTE — Progress Notes (Signed)
*  PRELIMINARY RESULTS* Echocardiogram 2D Echocardiogram has been performed.  Benard FORBES Stallion 05/13/2024, 3:46 PM

## 2024-05-16 ENCOUNTER — Ambulatory Visit: Payer: Self-pay

## 2024-05-18 ENCOUNTER — Other Ambulatory Visit: Payer: Self-pay

## 2024-05-18 DIAGNOSIS — I471 Supraventricular tachycardia, unspecified: Secondary | ICD-10-CM

## 2024-05-18 DIAGNOSIS — I498 Other specified cardiac arrhythmias: Secondary | ICD-10-CM

## 2024-05-19 ENCOUNTER — Ambulatory Visit: Payer: 59

## 2024-05-30 ENCOUNTER — Ambulatory Visit: Payer: 59

## 2024-06-03 ENCOUNTER — Ambulatory Visit: Payer: 59

## 2024-06-06 ENCOUNTER — Ambulatory Visit: Payer: Self-pay

## 2024-06-13 ENCOUNTER — Ambulatory Visit (INDEPENDENT_AMBULATORY_CARE_PROVIDER_SITE_OTHER): Payer: Self-pay

## 2024-06-13 DIAGNOSIS — R001 Bradycardia, unspecified: Secondary | ICD-10-CM | POA: Diagnosis not present

## 2024-06-14 ENCOUNTER — Ambulatory Visit: Payer: Self-pay | Admitting: Cardiology

## 2024-06-14 LAB — CUP PACEART REMOTE DEVICE CHECK
Date Time Interrogation Session: 20250908005400
Implantable Pulse Generator Implant Date: 20230621
Pulse Gen Serial Number: 177683

## 2024-06-20 ENCOUNTER — Ambulatory Visit: Payer: Self-pay

## 2024-06-20 ENCOUNTER — Ambulatory Visit: Payer: 59

## 2024-06-23 ENCOUNTER — Encounter (HOSPITAL_COMMUNITY): Payer: Self-pay

## 2024-06-23 ENCOUNTER — Telehealth (HOSPITAL_COMMUNITY): Payer: Self-pay

## 2024-06-23 NOTE — Telephone Encounter (Signed)
 Spoke with patient to complete pre-procedure call.     Health status review:  Any new medical conditions, recent signs of acute illness or been started on antibiotics? No Any recent hospitalizations or surgeries? No Any new medications started since pre-op visit? No  Follow all medication instructions prior to procedure or the procedure may be rescheduled:    HOLD Nebivolol  (Bystolic ) for 3 days prior to procedure. Last dose on October 11.  Essential chronic medications:  No medication should be continued, unless told otherwise. On the morning of your procedure do not take any medications. The night before your procedure and the morning of your procedure, wash thoroughly with the CHG surgical soap or antibacterial soap from the neck down, paying special attention to the area where your procedure will be performed.  Nothing to eat or drink after midnight prior to your procedure.  Pre-procedure testing scheduled: lab work by October 1.  Confirmed patient is scheduled for EP Study and Subcutaneous ICD  on Wednesday, October 15 with Dr. Soyla Norton. Instructed patient to arrive at the Main Entrance A at [facility]: [address] [place], [place] [address] and check in at Admitting at 11:30 AM.  Advised of plan to go home the same day and will only stay overnight if medically necessary. You MUST have a responsible adult to drive you home and MUST be with you the first 24 hours after you arrive home or your procedure could be cancelled.  Informed patient a nurse will call a day before the procedure to confirm arrival time and ensure instructions are followed.  Patient verbalized understanding to information provided and is agreeable to proceed with procedure.   Advised patient to contact RN Navigator at [phone], to inform of any new medications started after call or concerns prior to procedure.

## 2024-06-23 NOTE — Progress Notes (Signed)
 Remote Loop Recorder Transmission

## 2024-06-28 NOTE — Telephone Encounter (Signed)
 Clarified with Dr. Cathalene. Apologized for any miscommunication yesterday.  Advised that this was for an EPS w/ POSSIBLE SICD implant.  Explained that if VT not appearing, then SICD not needed at this time.  Further explained reasoning to this. Pt appreciates the call/information and understands now.

## 2024-06-30 ENCOUNTER — Ambulatory Visit: Payer: 59

## 2024-07-04 NOTE — Progress Notes (Signed)
 Remote Loop Recorder Transmission

## 2024-07-06 NOTE — Progress Notes (Signed)
 Remote Loop Recorder Transmission

## 2024-07-07 ENCOUNTER — Ambulatory Visit: Payer: Self-pay

## 2024-07-07 ENCOUNTER — Ambulatory Visit: Admitting: Neurology

## 2024-07-08 ENCOUNTER — Ambulatory Visit: Payer: 59

## 2024-07-11 ENCOUNTER — Encounter (INDEPENDENT_AMBULATORY_CARE_PROVIDER_SITE_OTHER): Payer: Self-pay

## 2024-07-13 ENCOUNTER — Ambulatory Visit (INDEPENDENT_AMBULATORY_CARE_PROVIDER_SITE_OTHER)

## 2024-07-13 DIAGNOSIS — R001 Bradycardia, unspecified: Secondary | ICD-10-CM | POA: Diagnosis not present

## 2024-07-13 LAB — CBC WITH DIFFERENTIAL/PLATELET
Basophils Absolute: 0 x10E3/uL (ref 0.0–0.2)
Basos: 1 %
EOS (ABSOLUTE): 0.3 x10E3/uL (ref 0.0–0.4)
Eos: 4 %
Hematocrit: 47.1 % (ref 37.5–51.0)
Hemoglobin: 15.8 g/dL (ref 13.0–17.7)
Immature Grans (Abs): 0 x10E3/uL (ref 0.0–0.1)
Immature Granulocytes: 0 %
Lymphocytes Absolute: 2.8 x10E3/uL (ref 0.7–3.1)
Lymphs: 38 %
MCH: 31.8 pg (ref 26.6–33.0)
MCHC: 33.5 g/dL (ref 31.5–35.7)
MCV: 95 fL (ref 79–97)
Monocytes Absolute: 0.8 x10E3/uL (ref 0.1–0.9)
Monocytes: 10 %
Neutrophils Absolute: 3.5 x10E3/uL (ref 1.4–7.0)
Neutrophils: 47 %
Platelets: 247 x10E3/uL (ref 150–450)
RBC: 4.97 x10E6/uL (ref 4.14–5.80)
RDW: 12.2 % (ref 11.6–15.4)
WBC: 7.4 x10E3/uL (ref 3.4–10.8)

## 2024-07-13 LAB — BASIC METABOLIC PANEL WITH GFR
BUN/Creatinine Ratio: 15 (ref 9–20)
BUN: 17 mg/dL (ref 6–24)
CO2: 24 mmol/L (ref 20–29)
Calcium: 9.8 mg/dL (ref 8.7–10.2)
Chloride: 102 mmol/L (ref 96–106)
Creatinine, Ser: 1.11 mg/dL (ref 0.76–1.27)
Glucose: 90 mg/dL (ref 70–99)
Potassium: 4.5 mmol/L (ref 3.5–5.2)
Sodium: 138 mmol/L (ref 134–144)
eGFR: 86 mL/min/1.73 (ref >59)

## 2024-07-14 LAB — CUP PACEART REMOTE DEVICE CHECK
Date Time Interrogation Session: 20251008003000
Implantable Pulse Generator Implant Date: 20230621
Pulse Gen Serial Number: 177683

## 2024-07-15 NOTE — Progress Notes (Signed)
 Remote Loop Recorder Transmission

## 2024-07-17 ENCOUNTER — Ambulatory Visit: Payer: Self-pay | Admitting: Cardiology

## 2024-07-19 ENCOUNTER — Encounter: Payer: Self-pay | Admitting: Cardiology

## 2024-07-19 NOTE — Pre-Procedure Instructions (Signed)
Instructed patient on the following items: Arrival time 1100 Nothing to eat or drink after midnight No meds AM of procedure Responsible person to drive you home and stay with you for 24 hrs Wash with special soap night before and morning of procedure  

## 2024-07-20 ENCOUNTER — Ambulatory Visit (HOSPITAL_COMMUNITY): Admitting: Anesthesiology

## 2024-07-20 ENCOUNTER — Encounter (HOSPITAL_COMMUNITY)
Admission: RE | Disposition: A | Discharge status: Home / Self Care | Payer: Self-pay | Source: Home / Self Care | Attending: Cardiology

## 2024-07-20 ENCOUNTER — Encounter (HOSPITAL_COMMUNITY): Payer: Self-pay | Admitting: Cardiology

## 2024-07-20 ENCOUNTER — Ambulatory Visit (HOSPITAL_COMMUNITY)
Admission: RE | Admit: 2024-07-20 | Discharge: 2024-07-20 | Disposition: A | Discharge status: Home / Self Care | Attending: Cardiology | Admitting: Cardiology

## 2024-07-20 ENCOUNTER — Other Ambulatory Visit: Payer: Self-pay

## 2024-07-20 DIAGNOSIS — R55 Syncope and collapse: Secondary | ICD-10-CM | POA: Diagnosis not present

## 2024-07-20 DIAGNOSIS — I472 Ventricular tachycardia, unspecified: Secondary | ICD-10-CM

## 2024-07-20 DIAGNOSIS — Z87891 Personal history of nicotine dependence: Secondary | ICD-10-CM | POA: Insufficient documentation

## 2024-07-20 DIAGNOSIS — F419 Anxiety disorder, unspecified: Secondary | ICD-10-CM | POA: Diagnosis not present

## 2024-07-20 DIAGNOSIS — I498 Other specified cardiac arrhythmias: Secondary | ICD-10-CM | POA: Diagnosis present

## 2024-07-20 DIAGNOSIS — I10 Essential (primary) hypertension: Secondary | ICD-10-CM | POA: Insufficient documentation

## 2024-07-20 DIAGNOSIS — Z8679 Personal history of other diseases of the circulatory system: Secondary | ICD-10-CM | POA: Insufficient documentation

## 2024-07-20 DIAGNOSIS — I471 Supraventricular tachycardia, unspecified: Secondary | ICD-10-CM | POA: Insufficient documentation

## 2024-07-20 HISTORY — PX: ELECTROPHYSIOLOGY STUDY: EP1205

## 2024-07-20 HISTORY — PX: SUBQ ICD IMPLANT: EP1223

## 2024-07-20 SURGERY — ELECTROPHYSIOLOGY STUDY
Anesthesia: Monitor Anesthesia Care

## 2024-07-20 MED ORDER — MIDAZOLAM HCL 2 MG/2ML IJ SOLN
INTRAMUSCULAR | Status: AC
Start: 2024-07-20 — End: 2024-07-20
  Filled 2024-07-20: qty 2

## 2024-07-20 MED ORDER — CHLORHEXIDINE GLUCONATE 4 % EX SOLN: 4.0000 | Freq: Once | CUTANEOUS | Status: DC

## 2024-07-20 MED ORDER — SODIUM CHLORIDE 0.9 % IV SOLN: INTRAVENOUS | Status: DC

## 2024-07-20 MED ORDER — SODIUM CHLORIDE 0.9% FLUSH: 3.0000 mL | INTRAVENOUS | Status: DC | PRN

## 2024-07-20 MED ORDER — CEFAZOLIN SODIUM-DEXTROSE 2-4 GM/100ML-% IV SOLN: 2.0000 g | INTRAVENOUS | Status: DC

## 2024-07-20 MED ORDER — BUPIVACAINE HCL (PF) 0.25 % IJ SOLN
INTRAMUSCULAR | Status: AC
Start: 2024-07-20 — End: 2024-07-20
  Filled 2024-07-20: qty 30

## 2024-07-20 MED ORDER — ACETAMINOPHEN 325 MG PO TABS: 650.0000 mg | ORAL_TABLET | ORAL | Status: DC | PRN

## 2024-07-20 MED ORDER — FENTANYL CITRATE (PF) 100 MCG/2ML IJ SOLN
INTRAMUSCULAR | Status: AC
  Filled 2024-07-20: qty 2

## 2024-07-20 MED ORDER — FENTANYL CITRATE (PF) 100 MCG/2ML IJ SOLN
INTRAMUSCULAR | Status: DC | PRN
  Administered 2024-07-20 (×2): 50 ug via INTRAVENOUS

## 2024-07-20 MED ORDER — SODIUM CHLORIDE 0.9 % IV SOLN: 80.0000 mg | INTRAVENOUS | Status: DC

## 2024-07-20 MED ORDER — BUPIVACAINE HCL (PF) 0.25 % IJ SOLN
INTRAMUSCULAR | Status: DC | PRN
  Administered 2024-07-20: 20 mL

## 2024-07-20 MED ORDER — MIDAZOLAM HCL 2 MG/2ML IJ SOLN
INTRAMUSCULAR | Status: DC | PRN
  Administered 2024-07-20: 2 mg via INTRAVENOUS

## 2024-07-20 MED ORDER — SODIUM CHLORIDE 0.9 % IV SOLN: 250.0000 mL | INTRAVENOUS | Status: DC | PRN

## 2024-07-20 MED ORDER — HEPARIN (PORCINE) IN NACL 1000-0.9 UT/500ML-% IV SOLN
INTRAVENOUS | Status: DC | PRN
  Administered 2024-07-20: 500 mL

## 2024-07-20 MED ORDER — ONDANSETRON HCL 4 MG/2ML IJ SOLN: 4.0000 mg | Freq: Four times a day (QID) | INTRAMUSCULAR | Status: DC | PRN

## 2024-07-20 MED ORDER — POVIDONE-IODINE 10 % EX SWAB
2.0000 | Freq: Once | CUTANEOUS | Status: AC
  Administered 2024-07-20: 2 via TOPICAL

## 2024-07-20 SURGICAL SUPPLY — 10 items
CABLE SURGICAL S-101-97-12 (CABLE) ×1 IMPLANT
CATH DECANAV F CURVE (CATHETERS) IMPLANT
CATH JOSEPH QUAD ALLRED 6F REP (CATHETERS) IMPLANT
CLOSURE MYNX CONTROL 6F/7F (Vascular Products) IMPLANT
PACK EP LF (CUSTOM PROCEDURE TRAY) ×1 IMPLANT
PAD DEFIB RADIO PHYSIO CONN (PAD) ×1 IMPLANT
PATCH CARTO3 (PAD) IMPLANT
SHEATH PINNACLE 8F 10CM (SHEATH) IMPLANT
SHEATH PROBE COVER 6X72 (BAG) IMPLANT
TRAY PACEMAKER INSERTION (PACKS) ×1 IMPLANT

## 2024-07-20 NOTE — Anesthesia Preprocedure Evaluation (Addendum)
 Anesthesia Evaluation  Patient identified by MRN, date of birth, ID band Patient awake    Reviewed: Allergy & Precautions, NPO status , Patient's Chart, lab work & pertinent test results, reviewed documented beta blocker date and time   Airway Mallampati: II  TM Distance: >3 FB Neck ROM: Full    Dental  (+) Dental Advisory Given, Edentulous Upper   Pulmonary former smoker   Pulmonary exam normal breath sounds clear to auscultation       Cardiovascular hypertension, Pt. on home beta blockers + dysrhythmias Supra Ventricular Tachycardia  Rhythm:Regular Rate:Bradycardia     Neuro/Psych  PSYCHIATRIC DISORDERS Anxiety     negative neurological ROS     GI/Hepatic negative GI ROS, Neg liver ROS,,,  Endo/Other  negative endocrine ROS    Renal/GU negative Renal ROS     Musculoskeletal negative musculoskeletal ROS (+)    Abdominal   Peds  Hematology negative hematology ROS (+)   Anesthesia Other Findings Day of surgery medications reviewed with the patient.  Reproductive/Obstetrics                              Anesthesia Physical Anesthesia Plan  ASA: 3  Anesthesia Plan: MAC   Post-op Pain Management:    Induction: Intravenous  PONV Risk Score and Plan: 1 and TIVA and Treatment may vary due to age or medical condition  Airway Management Planned: Natural Airway and Simple Face Mask  Additional Equipment:   Intra-op Plan:   Post-operative Plan:   Informed Consent: I have reviewed the patients History and Physical, chart, labs and discussed the procedure including the risks, benefits and alternatives for the proposed anesthesia with the patient or authorized representative who has indicated his/her understanding and acceptance.     Dental advisory given  Plan Discussed with: CRNA and Anesthesiologist  Anesthesia Plan Comments:          Anesthesia Quick Evaluation

## 2024-07-20 NOTE — H&P (Signed)
 Electrophysiology Office Note:   Date:  07/20/2024  ID:  Logan Benton, DOB [birth year], MRN [id number]  Primary Cardiologist: Logan Emmer, MD Primary Heart Failure: None Electrophysiologist: Logan Benton Logan Norton, MD      History of Present Illness:   Logan Benton is a 43 y.o. male with h/o hypertension, anxiety seen today for routine electrophysiology followup.   Admitted to the hospital after an episode of syncope.  He was found to have an abnormal EKG.  He had an exercise treadmill test while in the hospital and had no syncopal symptoms.  His syncope occurred during exercise.  He had an ILR implanted at that time.  Since last being seen in our clinic the patient reports doing overall well.  He has no chest pain or shortness of breath.  He did have 1 episode of tachycardia around the time of exercise.  Aside from that, he has no further issues..  he denies chest pain, palpitations, dyspnea, PND, orthopnea, nausea, vomiting, dizziness, syncope, edema, weight gain, or early satiety.   Review of systems complete and found to be negative unless listed in HPI.   Device History: Medtronic loop recorder implanted 03/26/2022 for syncope  EP Information / Studies Reviewed:    EKG is not ordered today. EKG from 11/30/23 reviewed which showed sinus rhythm, right bundle branch block        Risk Assessment/Calculations:          Physical Exam:   VS:  There were no vitals taken for this visit.   Wt Readings from Last 3 Encounters:  05/10/24 78.9 kg  04/20/24 78.6 kg  03/29/24 77.5 kg     GEN: Well nourished, well developed in no acute distress NECK: No JVD; No carotid bruits CARDIAC: Regular rate and rhythm, no murmurs, rubs, gallops RESPIRATORY:  Clear to auscultation without rales, wheezing or rhonchi  ABDOMEN: Soft, non-tender, non-distended EXTREMITIES:  No edema; No deformity   ILR Interrogation- reviewed in detail today,  See PACEART report  ASSESSMENT AND PLAN:     Syncope s/p Medtronic Loop recorder Normal device function See Pace Art report No changes today  2.  Brugada pattern EKG  3.  SVT  History of SVT and Brugada pattern.  Logan Benton plan for EP study.  If EP study is revealing of SVT, we Logan Benton ablate.  If he has VF on EP study, we Logan Benton plan for S ICD.  Risks and benefits have been discussed.  He understands the risks and has agreed to the procedures.  Logan Benton has presented today for surgery, with the diagnosis of SVT.  The various methods of treatment have been discussed with the patient and family. After consideration of risks, benefits and other options for treatment, the patient has consented to  Procedure(s): Catheter ablation as a surgical intervention .  Risks include but not limited to complete heart block, stroke, esophageal damage, nerve damage, bleeding, vascular damage, tamponade, perforation, MI, and death. The patient's history has been reviewed, patient examined, no change in status, stable for surgery.  I have reviewed the patient's chart and labs.  Questions were answered to the patient's satisfaction.    Logan Benton has presented today for surgery, with the diagnosis of Brudaga.  The various methods of treatment have been discussed with the patient and family. After consideration of risks, benefits and other options for treatment, the patient has consented to  Procedure(s): SICD implant as a surgical intervention .  Risks include but not limited  to bleeding, infection, pneumothorax, perforation, tamponade, vascular damage, renal failure, MI, stroke, death, and lead dislodgement . The patient's history has been reviewed, patient examined, no change in status, stable for surgery.  I have reviewed the patient's chart and labs.  Questions were answered to the patient's satisfaction.    Logan Norton, MD 07/20/2024 8:45 AM  Logan Norton, MD 07/20/2024 8:45 AM   Follow up with Dr. Norton as usual post  procedure  Signed, Logan Benton Logan Norton, MD

## 2024-07-20 NOTE — Progress Notes (Signed)
 Patient and patient mother given discharge instructions, education provided no further questions at this time. Patient able to ambulate before discharge. Able to tolerate PO intake. Patient site is clean, dry, intact and soft upon discharge. Patient seen by MD Cammnitz before discharge with mother in room. Patient IV removed.

## 2024-07-20 NOTE — Anesthesia Procedure Notes (Signed)
 Procedure Name: MAC Date/Time: 07/20/2024 11:48 AM  Performed by: Claudene Florina Boga, CRNAPre-anesthesia Checklist: Patient identified, Emergency Drugs available, Suction available and Patient being monitored Patient Re-evaluated:Patient Re-evaluated prior to induction Oxygen Delivery Method: Nasal cannula Dental Injury: Teeth and Oropharynx as per pre-operative assessment

## 2024-07-20 NOTE — Anesthesia Postprocedure Evaluation (Signed)
 Anesthesia Post Note  Patient: Logan Benton  Procedure(s) Performed: ELECTROPHYSIOLOGY STUDY SUBQ ICD IMPLANT     Patient location during evaluation: PACU Anesthesia Type: MAC Level of consciousness: awake and alert Pain management: pain level controlled Vital Signs Assessment: post-procedure vital signs reviewed and stable Respiratory status: spontaneous breathing, nonlabored ventilation, respiratory function stable and patient connected to nasal cannula oxygen Cardiovascular status: stable and blood pressure returned to baseline Postop Assessment: no apparent nausea or vomiting Anesthetic complications: no   There were no known notable events for this encounter.  Last Vitals:  Vitals:   07/20/24 1315 07/20/24 1330  BP: 103/73 113/71  Pulse: (!) 57 (!) 57  Resp: 14 18  Temp:    SpO2: 95% 95%    Last Pain:  Vitals:   07/20/24 1245  TempSrc: Oral  PainSc: 0-No pain   Pain Goal:                   Rome Ade

## 2024-07-20 NOTE — Transfer of Care (Signed)
 Immediate Anesthesia Transfer of Care Note  Patient: Logan Benton  Procedure(s) Performed: ELECTROPHYSIOLOGY STUDY SUBQ ICD IMPLANT  Patient Location: PACU and Cath Lab  Anesthesia Type:MAC  Level of Consciousness: awake, alert , and oriented  Airway & Oxygen Therapy: Patient Spontanous Breathing and Patient connected to nasal cannula oxygen  Post-op Assessment: Report given to RN and Post -op Vital signs reviewed and stable  Post vital signs: Reviewed and stable  Last Vitals:  Vitals Value Taken Time  BP 111/77 07/20/24 12:43  Temp    Pulse 57 07/20/24 12:45  Resp 12 07/20/24 12:45  SpO2 96 % 07/20/24 12:45  Vitals shown include unfiled device data.  Last Pain:  Vitals:   07/20/24 0902  TempSrc: Oral         Complications: There were no known notable events for this encounter.

## 2024-07-21 ENCOUNTER — Ambulatory Visit: Payer: 59

## 2024-07-25 ENCOUNTER — Ambulatory Visit: Payer: Self-pay

## 2024-07-27 ENCOUNTER — Encounter: Payer: Self-pay | Admitting: Cardiology

## 2024-07-28 ENCOUNTER — Telehealth: Payer: Self-pay | Admitting: Cardiology

## 2024-07-28 NOTE — Telephone Encounter (Signed)
 Pt c/o medication issue:  1. Name of Medication:   Blood pressure medication  2. How are you currently taking this medication (dosage and times per day)?   Currently not taking any BP medication  3. Are you having a reaction (difficulty breathing--STAT)?   4. What is your medication issue?   Patient called to follow-up on re-starting blood pressure medication and noted his BP has been trending high.

## 2024-07-28 NOTE — Telephone Encounter (Signed)
 Spoke with pt, he reports his nebivolol  was stopped after his ablation and he was going to try to get by without blood pressure medication but his blood pressure is running 152/93-153/96. He reports he discussed with dr inocencio about using telmisartan  if his blood pressure were to get to high. He would like to get that medication started asap. Aware will forward to dr inocencio.

## 2024-07-29 ENCOUNTER — Encounter: Payer: Self-pay | Admitting: Cardiovascular Disease

## 2024-07-29 MED ORDER — TELMISARTAN 20 MG PO TABS: 20.0000 mg | ORAL_TABLET | Freq: Every day | ORAL | 1 refills | Status: DC

## 2024-07-29 NOTE — Telephone Encounter (Signed)
 Logan Soyla Lunger, MD to [facility] Triage (Selected Message)     07/28/24  5:32 PM Can take 20 mg daily and can be further adjusted by PCP  Pt is aware of the above information.  RX sent into [facility] as requested.

## 2024-08-01 ENCOUNTER — Ambulatory Visit: Payer: 59

## 2024-08-03 ENCOUNTER — Ambulatory Visit

## 2024-08-04 ENCOUNTER — Other Ambulatory Visit: Payer: Self-pay

## 2024-08-04 ENCOUNTER — Telehealth: Payer: Self-pay | Admitting: Cardiology

## 2024-08-04 ENCOUNTER — Emergency Department (HOSPITAL_COMMUNITY)

## 2024-08-04 ENCOUNTER — Emergency Department (HOSPITAL_COMMUNITY)
Admission: EM | Admit: 2024-08-04 | Discharge: 2024-08-04 | Disposition: A | Discharge status: Home / Self Care | Source: Ambulatory Visit | Attending: Emergency Medicine | Admitting: Emergency Medicine

## 2024-08-04 ENCOUNTER — Encounter: Payer: Self-pay | Admitting: Family Medicine

## 2024-08-04 ENCOUNTER — Encounter (HOSPITAL_COMMUNITY): Payer: Self-pay

## 2024-08-04 DIAGNOSIS — I10 Essential (primary) hypertension: Secondary | ICD-10-CM | POA: Insufficient documentation

## 2024-08-04 DIAGNOSIS — Z79899 Other long term (current) drug therapy: Secondary | ICD-10-CM | POA: Diagnosis not present

## 2024-08-04 DIAGNOSIS — R079 Chest pain, unspecified: Secondary | ICD-10-CM

## 2024-08-04 DIAGNOSIS — R0789 Other chest pain: Secondary | ICD-10-CM | POA: Diagnosis present

## 2024-08-04 LAB — BASIC METABOLIC PANEL WITH GFR
Anion gap: 12 (ref 5–15)
BUN: 14 mg/dL (ref 6–20)
CO2: 22 mmol/L (ref 22–32)
Calcium: 8.9 mg/dL (ref 8.9–10.3)
Chloride: 104 mmol/L (ref 98–111)
Creatinine, Ser: 1.01 mg/dL (ref 0.61–1.24)
GFR, Estimated: >60 mL/min (ref >60)
Glucose, Bld: 110 mg/dL — ABNORMAL HIGH (ref 70–99)
Potassium: 3.7 mmol/L (ref 3.5–5.1)
Sodium: 138 mmol/L (ref 135–145)

## 2024-08-04 LAB — CBC WITH DIFFERENTIAL/PLATELET
Abs Immature Granulocytes: 0.01 K/uL (ref 0.00–0.07)
Basophils Absolute: 0.1 K/uL (ref 0.0–0.1)
Basophils Relative: 1 %
Eosinophils Absolute: 0.1 K/uL (ref 0.0–0.5)
Eosinophils Relative: 2 %
HCT: 46.5 % (ref 39.0–52.0)
Hemoglobin: 16.1 g/dL (ref 13.0–17.0)
Immature Granulocytes: 0 %
Lymphocytes Relative: 40 %
Lymphs Abs: 2.7 K/uL (ref 0.7–4.0)
MCH: 31.3 pg (ref 26.0–34.0)
MCHC: 34.6 g/dL (ref 30.0–36.0)
MCV: 90.3 fL (ref 80.0–100.0)
Monocytes Absolute: 0.7 K/uL (ref 0.1–1.0)
Monocytes Relative: 10 %
Neutro Abs: 3.2 K/uL (ref 1.7–7.7)
Neutrophils Relative %: 47 %
Platelets: 285 K/uL (ref 150–400)
RBC: 5.15 MIL/uL (ref 4.22–5.81)
RDW: 12.2 % (ref 11.5–15.5)
WBC: 6.7 K/uL (ref 4.0–10.5)
nRBC: 0 % (ref 0.0–0.2)

## 2024-08-04 LAB — MAGNESIUM: Magnesium: 2.1 mg/dL (ref 1.7–2.4)

## 2024-08-04 LAB — TROPONIN T, HIGH SENSITIVITY
Troponin T High Sensitivity: <15 ng/L (ref 0–19)
Troponin T High Sensitivity: <15 ng/L (ref 0–19)

## 2024-08-04 NOTE — ED Triage Notes (Signed)
 Pt arrives with c/o chest tightness that started last night and worsened today. Pt reports SOB and dizziness. Pt has hx of Brugada syndrome. Pt also reports his BPs have been up and down.

## 2024-08-04 NOTE — Discharge Instructions (Signed)
 Continue taking your medications as directed.  Try to monitor your blood pressure once a day.  Someone from your cardiologist office will likely contact you to arrange follow-up appointment with them.  Please return to the emergency department if you have any new or worsening symptoms.

## 2024-08-04 NOTE — Telephone Encounter (Signed)
 Pt c/o medication issue:  1. Name of Medication:   telmisartan (MICARDIS) 20 MG tablet    2. How are you currently taking this medication (dosage and times per day)?  Take 1 tablet (20 mg total) by mouth daily.     3. Are you having a reaction (difficulty breathing--STAT)? No  4. What is your medication issue? Pt stated after taking this medication he's been feeling foggy headed, hard to think, chest pressure and in his neck. He stated his bp has been 119/80 today and then 120/111. Please advise   Pt c/o of Chest Pain: STAT if active (IN THIS MOMENT) CP, including tightness, pressure, jaw pain, shoulder/upper arm/back pain, SOB, nausea, and vomiting.  1. Are you having CP right now (tightness, pressure, or discomfort)? Discomfort yes  2. Are you experiencing any other symptoms (ex. SOB, nausea, vomiting, sweating)? Foggy headed, hard to think and pressure in his neck as well   3. How long have you been experiencing CP? Since yesterday  4. Is your CP continuous or coming and going? Coming and going but steady right now   5. Have you taken Nitroglycerin ? No  6. If CP returns before callback, please consider calling 911. ?

## 2024-08-04 NOTE — ED Provider Notes (Signed)
 [place] EMERGENCY DEPARTMENT AT [facility] Provider Note   CSN: [id number] Arrival date & time: 08/04/24  1557     Patient presents with: Chest Pain   Logan Benton is a 43 y.o. male.    Chest Pain Associated symptoms: dizziness and shortness of breath   Associated symptoms: no abdominal pain, no fever, no headache, no nausea, no numbness, no vomiting and no weakness        Logan Benton is a 43 y.o. male past medical history of hypertension, IBS, anxiety, who presents to the Emergency Department for evaluation of left-sided chest pain and elevated blood pressure.  He states he was recently seen by cardiology for possible Brugada syndrome.  Was seen by EP and had comprehensive EP study with coronary sinus pacing and recording on 07/20/2024 with negative EP study.  Was previously on Nebivolol  5 mg states his blood pressure was well-controlled.  The beta-blocker was discontinued and he has been taking telmisartan 20 mg for about 1 week.  At 7 PM last evening he was having some mild left chest discomfort with left neck and shoulder discomfort.  He woke this morning with continued symptoms that seem to be slightly worse in severity.  He has some associated with shortness of breath and dizziness when the pain is sharper in quality.  He describes these as brief episodes and like a twinge of pain.  States his blood pressures have been elevated today.  He denies any recent illness, fever, chills, peripheral edema, numbness or tingling of his extremities. He contacted his cardiologists' office and advised to come here for evaluation  Prior to Admission medications   Medication Sig Start Date End Date Taking? Authorizing Provider  ascorbic acid (VITAMIN C) 500 MG tablet Take 500 mg by mouth daily.    [provider]  MAGNESIUM GLYCINATE PO Take 500 mg by mouth daily.    [provider]  Multiple Vitamin (MULTIVITAMIN PO) Take 1 tablet by mouth daily.     [provider]  telmisartan (MICARDIS) 20 MG tablet Take 1 tablet (20 mg total) by mouth daily. 07/29/24   Camnitz, Soyla Lunger, MD    Allergies: Ace inhibitors and Nsaids    Review of Systems  Constitutional:  Negative for appetite change, chills and fever.  Respiratory:  Positive for shortness of breath.   Cardiovascular:  Positive for chest pain.  Gastrointestinal:  Negative for abdominal pain, nausea and vomiting.  Musculoskeletal:  Negative for arthralgias.  Neurological:  Positive for dizziness. Negative for syncope, weakness, numbness and headaches.    Updated Vital Signs BP (!) 142/93   Pulse 67   Temp 98.5 F (36.9 C) (Oral)   Resp 15   Ht 5' 11 (1.803 m)   Wt 77.1 kg   SpO2 96%   BMI 23.71 kg/m   Physical Exam Vitals and nursing note reviewed.  Constitutional:      General: He is not in acute distress.    Appearance: Normal appearance. He is well-developed. He is not toxic-appearing.  Cardiovascular:     Rate and Rhythm: Normal rate and regular rhythm.     Pulses: Normal pulses.  Pulmonary:     Effort: Pulmonary effort is normal.     Breath sounds: Normal breath sounds.  Chest:     Chest wall: No tenderness.  Abdominal:     Palpations: Abdomen is soft.     Tenderness: There is no abdominal tenderness.  Musculoskeletal:  General: Normal range of motion.  Skin:    General: Skin is warm.     Capillary Refill: Capillary refill takes less than 2 seconds.  Neurological:     General: No focal deficit present.     Mental Status: He is alert.     Sensory: No sensory deficit.     Motor: No weakness.     (all labs ordered are listed, but only abnormal results are displayed) Labs Reviewed  BASIC METABOLIC PANEL WITH GFR - Abnormal; Notable for the following components:      Result Value   Glucose, Bld 110 (*)    All other components within normal limits  CBC WITH DIFFERENTIAL/PLATELET  MAGNESIUM  TROPONIN T, HIGH SENSITIVITY  TROPONIN T,  HIGH SENSITIVITY    EKG: EKG Interpretation Date/Time:  Thursday August 04 2024 17:17:25 EDT Ventricular Rate:  68 PR Interval:  130 QRS Duration:  120 QT Interval:  404 QTC Calculation: 430 R Axis:   39  Text Interpretation: Sinus rhythm IVCD, consider atypical RBBB ST elev, probable normal early repol pattern Confirmed by Yolande Charleston [phone]) on 08/04/2024 6:29:47 PM  Radiology: DG Chest Port 1 View Result Date: 08/04/2024 EXAM: 1 VIEW(S) XRAY OF THE CHEST 08/04/2024 05:25:00 PM COMPARISON: Chest x-ray 11/23/2023. CLINICAL HISTORY: 355200 Chest pain [id number] Chest pain FINDINGS: LUNGS AND PLEURA: No focal pulmonary opacity. No pulmonary edema. No pleural effusion. No pneumothorax. HEART AND MEDIASTINUM: Loop recorder device overlies the left chest. No acute abnormality of the cardiac and mediastinal silhouettes. BONES AND SOFT TISSUES: No acute osseous abnormality. IMPRESSION: 1. No acute process. Electronically signed by: Greig Pique MD 08/04/2024 05:27 PM EDT RP Workstation: [id number]     Procedures   Medications Ordered in the ED - No data to display                                  Medical Decision Making   Patient here with recent workup by EP for possible Brugada.  Had a negative EP study on 07/20/2024.  States that he was previously on beta-blocker for blood pressure control the medication was stopped and he was started on telmisartan 1 week ago.  Began having elevated blood pressure today with some left-sided chest pressure and intermittent sharp twinges of pain to the left chest and left shoulder neck area.  Some shortness of breath and dizziness associated when the pain occurs.  Denies any arm pain numbness or weakness headache or diaphoresis.  Followed by local cardiology.    Amount and/or Complexity of Data Reviewed Labs: ordered.    Details: Labs unremarkable.  Magnesium reassuring delta troponin also reassuring Radiology: ordered.    Details: Chest x-ray  without acute cardiopulmonary process ECG/medicine tests: ordered.    Details: EKG sinus rhythm IVCD, consider atypical right bundle branch block ST elevation probably early repol Discussion of management or test interpretation with external provider(s):   On recheck, patient resting comfortably.  Symptoms have resolved.  He does state that he took his Micardis approximately 1 hour prior to ER arrival.  He has previously reported elevated blood pressures possibly secondary to not taking the medication.  Blood pressures here have been reassuring.  Workup today also reassuring.  He does have cardiac history show will provide ambulatory referral back to his cardiologist.  Patient agreeable with plan.  Appropriate for discharge home, strict return precautions were also given.  Final diagnoses:  Nonspecific chest pain    ED Discharge Orders     None          Herlinda Milling, PA-C 08/04/24 1954    Yolande Lamar BROCKS, MD 08/06/24 [phone]

## 2024-08-04 NOTE — Telephone Encounter (Signed)
 Call transferred directly to triage.  Patient reports yesterday and today he has been having chest tightness with a weird feeling in his neck. Also feeling foggy-headed.  He reports BP readings (all from today): 120/111 108/100 145/80 95/70  He reports he is also feeling dizzy with chest tightness.  He was recently taken off of nebivolol  and started on Micardis 20 mg daily.  Due to symptoms and erratic BP readings, advised patient go to ED for further evaluation. Patient verbalized understanding and states he will go to ED.

## 2024-08-05 MED ORDER — TELMISARTAN 40 MG PO TABS: 40.0000 mg | ORAL_TABLET | Freq: Every day | ORAL | 1 refills | Status: AC

## 2024-08-05 NOTE — Telephone Encounter (Signed)
 Spoke with pt, he usually takes his micardis around 3 pm. When he gets up, everything is fine but then by 9 am he can tell his blood pressure is elevated. He will get a pressure in his head and a foggy feeling. His blood pressure is usually running 140-150/90's. He would like to increase the micardis if okay. He was made a follow up appointment with the APP but it is later in the month. He wanted the message to be sent to both dr delford and camnitz for any changes prior to appointment.

## 2024-08-05 NOTE — Telephone Encounter (Signed)
 Spoke with pt and advised per Dr Inocencio he may increase Telmisartan to 40mg  daily.  New Rx sent to pharmacy for Telmisartan 40mg  - 1 tablet by mouth daily

## 2024-08-05 NOTE — Telephone Encounter (Signed)
 Pt called  back in today and stated he went to the ED , they told him to fu With Cardiology  Pt stated his bp was 144/90 about 20 Mins ago.  Pt stated no other real symptoms right now.  He just wanted to know if Dr Inocencio needs to change his meds

## 2024-08-07 ENCOUNTER — Other Ambulatory Visit: Payer: Self-pay | Admitting: Family Medicine

## 2024-08-07 DIAGNOSIS — I10 Essential (primary) hypertension: Secondary | ICD-10-CM

## 2024-08-12 ENCOUNTER — Ambulatory Visit: Payer: 59

## 2024-08-13 ENCOUNTER — Ambulatory Visit

## 2024-08-13 DIAGNOSIS — R001 Bradycardia, unspecified: Secondary | ICD-10-CM

## 2024-08-14 LAB — CUP PACEART REMOTE DEVICE CHECK
Date Time Interrogation Session: 20251108013000
Implantable Pulse Generator Implant Date: 20230621
Pulse Gen Serial Number: 177683

## 2024-08-16 ENCOUNTER — Ambulatory Visit: Payer: Self-pay | Admitting: Cardiology

## 2024-08-17 NOTE — Progress Notes (Signed)
 Remote Loop Recorder Transmission

## 2024-08-20 ENCOUNTER — Encounter: Payer: Self-pay | Admitting: Cardiology

## 2024-08-22 ENCOUNTER — Ambulatory Visit: Payer: 59

## 2024-08-23 NOTE — Progress Notes (Deleted)
 Cardiology Office Note:    Date:  08/23/2024   ID:  Logan Benton, DOB Feb 17, 1981, MRN [id number]  PCP:  Bluford Jacqulyn MATSU, DO   [facility] Providers Cardiologist:  Maude Emmer, MD Electrophysiologist:  Will Gladis Norton, MD { Click to update primary MD,subspecialty MD or APP then REFRESH:1}    Referring MD: Bluford Jacqulyn MATSU, DO   Chief complaint: Follow up of chest pain/Brugada pattern     History of Present Illness:   Logan Benton is a 43 y.o. 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   Cardiac monitor 01/2020 showing sinus rhythm, sinus arrhythmia, sinus bradycardia, and sinus tachycardia. Coronary CTA 05/2021 w/ calcium score of 0.   03/2022 admitted following c/o CP w/ radiation into neck, rapid HR while running, associated w/ SOB and nausea. Sat down and had a syncopal episode. There was initial concern for Brugada syndrome, exercise stress test unremarkable. Echo showing LVEF 60-65%, normal LV function, no RWMA, normal diastolic paramets, normal RV size and function, trivial MV regurg, normal AV, RA pressure 3 mmHg. ILR implanted. Lisinopril  stopped and metoprolol  intitiated. 07/2023 PCP switched metoprolol  to Nebivolol  d/t side effects.   Continued to have intermittent atypical chest pain/tightness appearing musculoskeletal in etiology. Brugada type 1 pattern observed on EKG at office visit on 12/11/2023. ETT ordered showing excellent exercise capacity, no ST deviation noted, EKG negative for ischemia, Duke treadmill score of 14. Referred to EP, who discussed EP study w/ possible ablation or ICD implant. Study was performed on 07/20/2024, which was negative arrhythmia exacerbation.  ROS:   Please see the history of present illness.     *** All other systems reviewed and are negative.     Past Medical History:  Diagnosis Date   Abscessed tooth    Anxiety    C. difficile diarrhea    presumptive diagnosis   Hypertension    Trigger finger     Past Surgical History:  Procedure Laterality Date   BILATERAL CARPAL TUNNEL RELEASE     BIOPSY  06/29/2020   Procedure: BIOPSY;  Surgeon: Cindie Carlin POUR, DO;  Location: [facility];  Service: Endoscopy;;   BIOPSY  09/03/2020   Procedure: BIOPSY;  Surgeon: Cindie Carlin POUR, DO;  [facility]: [facility];  Service: Endoscopy;;   BIOPSY  03/08/2021   Procedure: BIOPSY;  Surgeon: Cindie Carlin POUR, DO;  [facility]: [facility];  Service: Endoscopy;;   BIOPSY  05/10/2021   Procedure: BIOPSY;  Surgeon: Cindie Carlin POUR, DO;  [facility]: [facility];  Service: Endoscopy;;   CHOLECYSTECTOMY N/A 04/25/2022   Procedure: LAPAROSCOPIC CHOLECYSTECTOMY;  Surgeon: Kallie Manuelita BROCKS, MD;  Location: [facility];  Service: General;  Laterality: N/A;   COLONOSCOPY WITH PROPOFOL  N/A 06/29/2020   Dr. Cindie: 4 mm tubular adenoma removed, nonbleeding internal hemorrhoids.  Repeat colonoscopy in 7 years.   COLONOSCOPY WITH PROPOFOL  N/A 05/10/2021   Procedure: COLONOSCOPY WITH PROPOFOL ;  Surgeon: Cindie Carlin POUR, DO;  Location: [facility];  Service: Endoscopy;  Laterality: N/A;  1:15pm   ELECTROPHYSIOLOGY STUDY N/A 07/20/2024   Procedure: ELECTROPHYSIOLOGY STUDY;  Surgeon: Norton Soyla Gladis, MD;  Location: [facility];  Service: Cardiovascular;  Laterality: N/A;   ESOPHAGOGASTRODUODENOSCOPY (EGD) WITH PROPOFOL  N/A 06/29/2020   Dr.  Carver: LA grade C esophagitis, gastritis with mild chronic gastritis on path, small bowel biopsy unremarkable.   ESOPHAGOGASTRODUODENOSCOPY (EGD) WITH PROPOFOL  N/A 09/03/2020   Dr. Cindie: Esophageal mucosal changes suggestive of short segment Barrett's esophagus, biopsies consistent.  Next EGD in 3 years   ESOPHAGOGASTRODUODENOSCOPY (EGD) WITH PROPOFOL  N/A  03/08/2021   Procedure: ESOPHAGOGASTRODUODENOSCOPY (EGD) WITH PROPOFOL ;  Surgeon: Cindie Carlin POUR, DO;  [facility];  Service: Endoscopy;  Laterality: N/A;  1:00pm   LOOP RECORDER INSERTION N/A 03/26/2022   Procedure: LOOP RECORDER INSERTION;  Surgeon: Inocencio Soyla Lunger, MD;  Location: [facility];  Service: Cardiovascular;  Laterality: N/A;   MASS EXCISION Right 04/25/2022   Procedure: EXCISION MASS UPPER RIGHT ABDOMEN;  Surgeon: Kallie Manuelita BROCKS, MD;  Location: [facility];  Service: General;  Laterality: Right;   POLYPECTOMY  06/29/2020   Procedure: POLYPECTOMY;  Surgeon: Cindie Carlin POUR, DO;  [facility]: [facility];  Service: Endoscopy;;   SHOULDER SURGERY Right    x2   SUBQ ICD IMPLANT N/A 07/20/2024   Procedure: SUBQ ICD IMPLANT;  Surgeon: Inocencio Soyla Lunger, MD;  Location: [facility];  Service: Cardiovascular;  Laterality: N/A;   TRIGGER FINGER RELEASE Right 2016    Current Medications: No outpatient medications have been marked as taking for the 08/24/24 encounter (Appointment) with Janene Boer, PA.     Allergies:   Nsaids and Ace inhibitors   Social History   Socioeconomic History   Marital status: Divorced    Spouse name: Not on file   Number of children: Not on file   Years of education: Not on file   Highest education level: 12th grade  Occupational History   Not on file  Tobacco Use   Smoking status: Former    Current packs/day: 0.00    Types: Cigarettes    Quit date: 11/03/2019    Years since quitting: 4.8    Passive exposure: Past   Smokeless tobacco: Never   Tobacco comments:    nicoten gum  Vaping Use   Vaping status: Former   Quit date: 03/07/2019  Substance and Sexual Activity   Alcohol use: No   Drug use: Yes    Types: Marijuana   Sexual activity: Not Currently    Partners: Female  Other Topics Concern   Not on file  Social History Narrative   Not on file   Social Drivers of Health   Financial Resource Strain: Low  Risk  (09/23/2023)   Overall Financial Resource Strain (CARDIA)    Difficulty of Paying Living Expenses: Not very hard  Food Insecurity: No Food Insecurity (09/23/2023)   Hunger Vital Sign    Worried About Running Out of Food in the Last Year: Never true    Ran Out of Food in the Last Year: Never true  Transportation Needs: No Transportation Needs (09/23/2023)   PRAPARE - Administrator, Civil Service (Medical): No    Lack of Transportation (Non-Medical): No  Physical Activity: Sufficiently Active (09/23/2023)   Exercise Vital Sign    Days of Exercise per Week: 3 days    Minutes of Exercise per Session: 80 min  Stress: No Stress Concern Present (09/23/2023)   [employer] of Occupational Health - Occupational Stress Questionnaire    Feeling of Stress : Not at all  Social Connections: Unknown (09/23/2023)   Social Connection and Isolation Panel    Frequency of Communication with Friends and Family: Patient declined    Frequency of  Social Gatherings with Friends and Family: Patient declined    Attends Religious Services: Patient declined    Database Administrator or Organizations: No    Attends Engineer, Structural: Not on file    Marital Status: Divorced     Family History: The patient's ***family history includes Alcohol abuse in his paternal grandfather; Anxiety disorder in his paternal grandmother; Bladder Cancer in his maternal grandfather and mother; Irritable bowel syndrome in his paternal grandmother; Kidney disease in his father; Rheum arthritis in his mother. There is no history of Colon cancer, Celiac disease, or Inflammatory bowel disease.  EKGs/Labs/Other Studies Reviewed:    The following studies were reviewed today: ***      Recent Labs: 03/30/2024: ALT 31 08/04/2024: BUN 14; Creatinine, Ser 1.01; Hemoglobin 16.1; Magnesium 2.1; Platelets 285; Potassium 3.7; Sodium 138  Recent Lipid Panel    Component Value Date/Time   CHOL 163  06/23/2022 1200   TRIG 53 06/23/2022 1200   HDL 85 06/23/2022 1200   CHOLHDL 1.9 06/23/2022 1200   LDLCALC 67 06/23/2022 1200     Risk Assessment/Calculations:   {Does this patient have ATRIAL FIBRILLATION?:[id number]}  No BP recorded.  {Refresh Note OR Click here to enter BP  :1}***         Physical Exam:    VS:  There were no vitals taken for this visit.       Wt Readings from Last 3 Encounters:  08/04/24 170 lb (77.1 kg)  07/20/24 170 lb (77.1 kg)  05/10/24 174 lb (78.9 kg)     GEN: *** Well nourished, well developed in no acute distress HEENT: Normal NECK: No JVD; No carotid bruits CARDIAC: *** S1-S2 normal, RRR, no murmurs, rubs, gallops RESPIRATORY:  Clear to auscultation without rales, wheezing or rhonchi  MUSCULOSKELETAL:  No edema; No deformity  SKIN: Warm and dry NEUROLOGIC:  Alert and oriented x 3 PSYCHIATRIC:  Normal affect       Assessment & Plan Brugada pattern on electrocardiogram        {Are you ordering a CV Procedure (e.g. stress test, cath, DCCV, TEE, etc)?   Press F2        :[id number]}    Medication Adjustments/Labs and Tests Ordered: Current medicines are reviewed at length with the patient today.  Concerns regarding medicines are outlined above.  No orders of the defined types were placed in this encounter.  No orders of the defined types were placed in this encounter.   There are no Patient Instructions on file for this visit.   Signed, Miriam FORBES Shams, NP  08/23/2024 8:57 PM    [facility]

## 2024-08-24 ENCOUNTER — Ambulatory Visit: Admitting: Physician Assistant

## 2024-09-05 ENCOUNTER — Telehealth: Payer: Self-pay | Admitting: *Deleted

## 2024-09-05 NOTE — Telephone Encounter (Signed)
 Pt reports he is NOT taking another beta blocker again.  States he does not feel he needs a medication at this time but will call back if need to address at a later date.   Forwarding to MD for his FYI

## 2024-09-13 ENCOUNTER — Ambulatory Visit (INDEPENDENT_AMBULATORY_CARE_PROVIDER_SITE_OTHER)

## 2024-09-14 ENCOUNTER — Ambulatory Visit: Payer: Self-pay | Admitting: Cardiology

## 2024-09-14 LAB — CUP PACEART REMOTE DEVICE CHECK
Date Time Interrogation Session: 20251209025100
Implantable Pulse Generator Implant Date: 20230621
Pulse Gen Serial Number: 177683

## 2024-09-14 NOTE — Progress Notes (Signed)
 Remote Loop Recorder Transmission

## 2024-10-08 ENCOUNTER — Ambulatory Visit: Payer: Self-pay | Admitting: Family Medicine

## 2024-10-08 LAB — BASIC METABOLIC PANEL WITH GFR
BUN/Creatinine Ratio: 12 (ref 9–20)
BUN: 15 mg/dL (ref 6–24)
CO2: 24 mmol/L (ref 20–29)
Calcium: 10 mg/dL (ref 8.7–10.2)
Chloride: 100 mmol/L (ref 96–106)
Creatinine, Ser: 1.25 mg/dL (ref 0.76–1.27)
Glucose: 95 mg/dL (ref 70–99)
Potassium: 4.5 mmol/L (ref 3.5–5.2)
Sodium: 140 mmol/L (ref 134–144)
eGFR: 74 mL/min/1.73

## 2024-10-10 ENCOUNTER — Other Ambulatory Visit: Payer: Self-pay | Admitting: Family Medicine

## 2024-10-10 ENCOUNTER — Encounter: Payer: Self-pay | Admitting: Family Medicine

## 2024-10-10 DIAGNOSIS — Z202 Contact with and (suspected) exposure to infections with a predominantly sexual mode of transmission: Secondary | ICD-10-CM

## 2024-10-12 ENCOUNTER — Ambulatory Visit: Payer: Self-pay | Admitting: Family Medicine

## 2024-10-12 LAB — SYPHILIS: RPR W/REFLEX TO RPR TITER AND TREPONEMAL ANTIBODIES, TRADITIONAL SCREENING AND DIAGNOSIS ALGORITHM: RPR Ser Ql: NONREACTIVE

## 2024-10-12 LAB — HIV ANTIBODY (ROUTINE TESTING W REFLEX): HIV Screen 4th Generation wRfx: NONREACTIVE

## 2024-10-13 ENCOUNTER — Other Ambulatory Visit: Payer: Self-pay | Admitting: Family Medicine

## 2024-10-13 DIAGNOSIS — Z202 Contact with and (suspected) exposure to infections with a predominantly sexual mode of transmission: Secondary | ICD-10-CM

## 2024-10-13 LAB — GC/CHLAMYDIA PROBE AMP
Chlamydia trachomatis, NAA: NEGATIVE
Neisseria Gonorrhoeae by PCR: NEGATIVE

## 2024-10-14 ENCOUNTER — Ambulatory Visit

## 2024-10-14 DIAGNOSIS — I471 Supraventricular tachycardia, unspecified: Secondary | ICD-10-CM | POA: Diagnosis not present

## 2024-10-16 ENCOUNTER — Ambulatory Visit: Payer: Self-pay | Admitting: Family Medicine

## 2024-10-16 LAB — CUP PACEART REMOTE DEVICE CHECK
Date Time Interrogation Session: 20260109003400
Implantable Pulse Generator Implant Date: 20230621
Pulse Gen Serial Number: 177683

## 2024-10-16 LAB — GENITAL MYCOPLASMAS NAA, URINE: Mycoplasma genitalium NAA: NEGATIVE

## 2024-10-17 ENCOUNTER — Ambulatory Visit: Payer: Self-pay | Admitting: Cardiology

## 2024-10-17 NOTE — Progress Notes (Signed)
 Remote Loop Recorder Transmission

## 2024-10-20 ENCOUNTER — Ambulatory Visit: Admitting: Pulmonary Disease

## 2024-11-10 ENCOUNTER — Encounter: Payer: Self-pay | Admitting: Family Medicine

## 2024-11-10 ENCOUNTER — Encounter: Payer: Self-pay | Admitting: Cardiovascular Disease

## 2024-11-14 ENCOUNTER — Encounter

## 2024-11-22 ENCOUNTER — Ambulatory Visit: Admitting: Pulmonary Disease

## 2024-12-02 ENCOUNTER — Ambulatory Visit: Admitting: Physician Assistant

## 2024-12-15 ENCOUNTER — Encounter

## 2025-01-15 ENCOUNTER — Encounter

## 2025-02-15 ENCOUNTER — Encounter

## 2025-03-18 ENCOUNTER — Encounter

## 2025-04-18 ENCOUNTER — Encounter

## 2025-05-19 ENCOUNTER — Encounter

## 2025-06-19 ENCOUNTER — Encounter

## 2025-07-20 ENCOUNTER — Encounter

## 2025-08-20 ENCOUNTER — Encounter

## 2025-09-20 ENCOUNTER — Encounter
